# Patient Record
Sex: Male | Born: 1965 | Race: White | Hispanic: No | Marital: Single | Smoking: Former smoker
Health system: Southern US, Community
[De-identification: ages and names within clinical notes are randomized; demographics above are authoritative.]

## PROBLEM LIST (undated history)

## (undated) ENCOUNTER — Ambulatory Visit: Admission: EM | Disposition: A | Discharge status: Home / Self Care | Payer: BC Managed Care – PPO

## (undated) DIAGNOSIS — I10 Essential (primary) hypertension: Secondary | ICD-10-CM

## (undated) DIAGNOSIS — M1990 Unspecified osteoarthritis, unspecified site: Secondary | ICD-10-CM

## (undated) DIAGNOSIS — K219 Gastro-esophageal reflux disease without esophagitis: Secondary | ICD-10-CM

## (undated) DIAGNOSIS — K589 Irritable bowel syndrome without diarrhea: Secondary | ICD-10-CM

## (undated) DIAGNOSIS — Z98811 Dental restoration status: Secondary | ICD-10-CM

## (undated) DIAGNOSIS — R112 Nausea with vomiting, unspecified: Secondary | ICD-10-CM

## (undated) DIAGNOSIS — Z9889 Other specified postprocedural states: Secondary | ICD-10-CM

## (undated) DIAGNOSIS — K635 Polyp of colon: Secondary | ICD-10-CM

## (undated) DIAGNOSIS — K200 Eosinophilic esophagitis: Secondary | ICD-10-CM

## (undated) DIAGNOSIS — M25512 Pain in left shoulder: Secondary | ICD-10-CM

## (undated) DIAGNOSIS — R42 Dizziness and giddiness: Secondary | ICD-10-CM

## (undated) DIAGNOSIS — Z136 Encounter for screening for cardiovascular disorders: Secondary | ICD-10-CM

## (undated) DIAGNOSIS — F411 Generalized anxiety disorder: Secondary | ICD-10-CM

## (undated) HISTORY — DX: Polyp of colon: K63.5

## (undated) HISTORY — PX: APPENDECTOMY: SHX54

## (undated) HISTORY — DX: Eosinophilic esophagitis: K20.0

## (undated) HISTORY — DX: Irritable bowel syndrome, unspecified: K58.9

## (undated) HISTORY — DX: Encounter for screening for cardiovascular disorders: Z13.6

## (undated) HISTORY — DX: Generalized anxiety disorder: F41.1

## (undated) HISTORY — DX: Gastro-esophageal reflux disease without esophagitis: K21.9

## (undated) HISTORY — PX: JOINT REPLACEMENT: SHX530

---

## 1996-08-16 HISTORY — PX: NASAL SINUS SURGERY: SHX719

## 1998-08-16 HISTORY — PX: KNEE ARTHROSCOPY: SUR90

## 2000-01-14 ENCOUNTER — Emergency Department (HOSPITAL_COMMUNITY): Admission: EM | Admit: 2000-01-14 | Discharge: 2000-01-14 | Payer: Self-pay | Admitting: Emergency Medicine

## 2000-01-14 ENCOUNTER — Encounter: Payer: Self-pay | Admitting: Emergency Medicine

## 2001-08-16 DIAGNOSIS — Z136 Encounter for screening for cardiovascular disorders: Secondary | ICD-10-CM

## 2001-08-16 HISTORY — DX: Encounter for screening for cardiovascular disorders: Z13.6

## 2002-01-09 ENCOUNTER — Ambulatory Visit (HOSPITAL_COMMUNITY): Admission: RE | Admit: 2002-01-09 | Discharge: 2002-01-09 | Payer: Self-pay | Admitting: Gastroenterology

## 2002-01-18 ENCOUNTER — Encounter: Admission: RE | Admit: 2002-01-18 | Discharge: 2002-01-18 | Payer: Self-pay | Admitting: Gastroenterology

## 2002-01-18 ENCOUNTER — Encounter: Payer: Self-pay | Admitting: Gastroenterology

## 2007-04-17 HISTORY — PX: OTHER SURGICAL HISTORY: SHX169

## 2007-04-24 ENCOUNTER — Ambulatory Visit (HOSPITAL_BASED_OUTPATIENT_CLINIC_OR_DEPARTMENT_OTHER): Admission: RE | Admit: 2007-04-24 | Discharge: 2007-04-24 | Payer: Self-pay | Admitting: Orthopedic Surgery

## 2007-09-25 ENCOUNTER — Ambulatory Visit: Payer: Self-pay | Admitting: Family Medicine

## 2008-08-16 DIAGNOSIS — K200 Eosinophilic esophagitis: Secondary | ICD-10-CM

## 2008-08-16 HISTORY — DX: Eosinophilic esophagitis: K20.0

## 2009-11-14 ENCOUNTER — Ambulatory Visit: Payer: Self-pay | Admitting: Otolaryngology

## 2010-12-29 NOTE — Op Note (Signed)
NAME:  Ronald Cordova, Ronald Cordova NO.:  [id number]   MEDICAL RECORD NO.:  [id number]          PATIENT TYPE:  AMB   LOCATION:  [facility]                         FACILITY:  [facility]   PHYSICIAN:  Marlowe Kays, M.D.  DATE OF BIRTH:  November 05, 1965   DATE OF PROCEDURE:  04/24/2007  DATE OF DISCHARGE:                               OPERATIVE REPORT   PREOPERATIVE DIAGNOSIS:  Right carpal tunnel syndrome.   POSTOPERATIVE DIAGNOSIS:  Right carpal tunnel syndrome.   OPERATION:  Decompression of median nerve, right wrist and hand.   SURGEON:  Marlowe Kays, M.D.   ASSISTANTDruscilla Brownie. Underwood, P.A.-C.   ANESTHESIA:  General.   PATHOLOGY AND JUSTIFICATION FOR PROCEDURE:  Signs and symptoms for  carpal tunnel syndrome with abnormal median nerve conduction studies  bilaterally, slightly on the left and severe on the right.  See  operative description below for additional aspects of pathology.   PROCEDURE:  By his choice, we did a general anesthetic. A time-out was  performed.  Right arm was esmarched out nonsterilely and prepped with  DuraPrep from mid forearm to fingertips and draped in a sterile field.  I marked out a curved incision along the base of thenar eminence,  crossing obliquely over the flexor crease of the wrist in the distal  forearm.  The palmaris longus tendon was identified and retracted to the  radial side.  Median nerve was identified and I then began progressive  release into the proximal carpal canal.  At this point, the dominant  finding appeared to be a muscle belly that was pressing on the median  nerve.  I had to snip through the substance rather than being able to go  to the ulnar side because of its anatomic dimensions.  I then also  released the carpal canal.  There was some slight compression of the  median nerve in this location and I continued the dissection of the  skin, subcutaneous tissue and fascia into the distal palm.  The wound  was then  irrigated with sterile saline.  Skin and subcutaneous tissue  were approximated with interrupted 4-0 nylon mattress sutures.  Betadine, Adaptic, dry sterile dressing and a volar plaster splint were  applied.  The tourniquet was released.  I was preparing this dictation  when the operating room personnel came out and said he had a little  breakthrough bleeding around the side of the splint at the base of the  thumb.  On inspection, this appeared to be fairly minor and I treated  this by adding some sterile sponges and another Ace wrap, which gave a  nice compressive dressing and no substantial bleeding was noted.  Other  than this, there were no complications.  He was taken to the recovery  room in satisfactory condition.           ______________________________  Marlowe Kays, M.D.     JA/MEDQ  D:  04/24/2007  T:  04/25/2007  Job:  [id number]

## 2011-01-08 DIAGNOSIS — K635 Polyp of colon: Secondary | ICD-10-CM

## 2011-01-08 HISTORY — DX: Polyp of colon: K63.5

## 2011-05-06 ENCOUNTER — Encounter: Payer: Self-pay | Admitting: Internal Medicine

## 2011-05-07 ENCOUNTER — Ambulatory Visit (INDEPENDENT_AMBULATORY_CARE_PROVIDER_SITE_OTHER): Payer: BC Managed Care – PPO | Admitting: Internal Medicine

## 2011-05-07 ENCOUNTER — Encounter: Payer: Self-pay | Admitting: Internal Medicine

## 2011-05-07 DIAGNOSIS — Z206 Contact with and (suspected) exposure to human immunodeficiency virus [HIV]: Secondary | ICD-10-CM

## 2011-05-07 DIAGNOSIS — Z79899 Other long term (current) drug therapy: Secondary | ICD-10-CM

## 2011-05-07 DIAGNOSIS — F411 Generalized anxiety disorder: Secondary | ICD-10-CM

## 2011-05-07 DIAGNOSIS — Z20828 Contact with and (suspected) exposure to other viral communicable diseases: Secondary | ICD-10-CM

## 2011-05-07 DIAGNOSIS — Z720 Tobacco use: Secondary | ICD-10-CM

## 2011-05-07 DIAGNOSIS — F419 Anxiety disorder, unspecified: Secondary | ICD-10-CM

## 2011-05-07 DIAGNOSIS — E785 Hyperlipidemia, unspecified: Secondary | ICD-10-CM

## 2011-05-07 DIAGNOSIS — Z7721 Contact with and (suspected) exposure to potentially hazardous body fluids: Secondary | ICD-10-CM

## 2011-05-07 DIAGNOSIS — F17200 Nicotine dependence, unspecified, uncomplicated: Secondary | ICD-10-CM

## 2011-05-07 MED ORDER — CLONAZEPAM 0.5 MG PO TABS: 0.5000 mg | ORAL_TABLET | Freq: Every day | ORAL | Status: DC | PRN

## 2011-05-07 MED ORDER — VARENICLINE TARTRATE 0.5 MG X 11 & 1 MG X 42 PO MISC: ORAL | Status: DC

## 2011-05-07 MED ORDER — VARENICLINE TARTRATE 0.5 MG PO TABS: 0.5000 mg | ORAL_TABLET | Freq: Two times a day (BID) | ORAL | Status: AC

## 2011-05-09 ENCOUNTER — Encounter: Payer: Self-pay | Admitting: Internal Medicine

## 2011-05-09 DIAGNOSIS — E782 Mixed hyperlipidemia: Secondary | ICD-10-CM | POA: Insufficient documentation

## 2011-05-09 DIAGNOSIS — K589 Irritable bowel syndrome without diarrhea: Secondary | ICD-10-CM | POA: Insufficient documentation

## 2011-05-09 DIAGNOSIS — Z136 Encounter for screening for cardiovascular disorders: Secondary | ICD-10-CM | POA: Insufficient documentation

## 2011-05-09 DIAGNOSIS — F411 Generalized anxiety disorder: Secondary | ICD-10-CM | POA: Insufficient documentation

## 2011-05-09 DIAGNOSIS — K200 Eosinophilic esophagitis: Secondary | ICD-10-CM | POA: Insufficient documentation

## 2011-05-09 DIAGNOSIS — K648 Other hemorrhoids: Secondary | ICD-10-CM | POA: Insufficient documentation

## 2011-05-09 NOTE — Assessment & Plan Note (Signed)
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  Repeat due

## 2011-05-09 NOTE — Assessment & Plan Note (Signed)
Managed with clonazepam.  He does not want an SSRI because of the potential weight gain.
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
## 2011-05-20 ENCOUNTER — Encounter: Payer: Self-pay | Admitting: Internal Medicine

## 2011-05-28 LAB — POCT HEMOGLOBIN-HEMACUE: Operator id: 134391

## 2011-11-16 ENCOUNTER — Other Ambulatory Visit: Payer: Self-pay | Admitting: Internal Medicine

## 2011-11-16 DIAGNOSIS — F419 Anxiety disorder, unspecified: Secondary | ICD-10-CM

## 2011-11-16 MED ORDER — CLONAZEPAM 0.5 MG PO TABS: 0.5000 mg | ORAL_TABLET | Freq: Every day | ORAL | Status: DC | PRN

## 2011-11-16 MED ORDER — FLUTICASONE FUROATE 27.5 MCG/SPRAY NA SUSP: 2.0000 | Freq: Every day | NASAL | Status: DC

## 2011-11-23 ENCOUNTER — Telehealth: Payer: Self-pay | Admitting: Internal Medicine

## 2011-11-23 NOTE — Telephone Encounter (Signed)
[phone] Pt wanted to know if we have the whooping cough vaccine.  He would like to get this

## 2011-11-24 ENCOUNTER — Other Ambulatory Visit (INDEPENDENT_AMBULATORY_CARE_PROVIDER_SITE_OTHER): Payer: BC Managed Care – PPO | Admitting: *Deleted

## 2011-11-24 DIAGNOSIS — Z79899 Other long term (current) drug therapy: Secondary | ICD-10-CM

## 2011-11-24 DIAGNOSIS — E785 Hyperlipidemia, unspecified: Secondary | ICD-10-CM

## 2011-11-24 DIAGNOSIS — Z206 Contact with and (suspected) exposure to human immunodeficiency virus [HIV]: Secondary | ICD-10-CM

## 2011-11-24 NOTE — Progress Notes (Signed)
Addended by: Jobie Quaker on: 11/24/2011 09:23 AM   Modules accepted: Orders

## 2011-11-24 NOTE — Telephone Encounter (Signed)
Yes we do, Erie Noe notified patient

## 2011-11-25 ENCOUNTER — Ambulatory Visit (INDEPENDENT_AMBULATORY_CARE_PROVIDER_SITE_OTHER): Payer: BC Managed Care – PPO | Admitting: *Deleted

## 2011-11-25 DIAGNOSIS — Z23 Encounter for immunization: Secondary | ICD-10-CM

## 2011-11-30 ENCOUNTER — Telehealth: Payer: Self-pay | Admitting: Internal Medicine

## 2011-11-30 NOTE — Telephone Encounter (Signed)
[phone] Pt called checking on labs results he had done wed of last week Please advise pt

## 2011-12-01 ENCOUNTER — Telehealth: Payer: Self-pay | Admitting: Internal Medicine

## 2011-12-01 NOTE — Telephone Encounter (Signed)
There is already another note about this. See other note.

## 2011-12-01 NOTE — Telephone Encounter (Signed)
Patient is wanting the results from labs done on 4.10.13. Patient is wanting a call back today.

## 2011-12-01 NOTE — Telephone Encounter (Signed)
Patient had these labs done here,  but it was sent to [facility] due to patient's insurance. I called and requested labs.

## 2011-12-02 ENCOUNTER — Telehealth: Payer: Self-pay | Admitting: Internal Medicine

## 2011-12-02 NOTE — Telephone Encounter (Signed)
Patient returned call and was notified of his labs ( see other phone note) also he is coming back in tomorrow for HIV and has requested that it be sent to [facility].

## 2011-12-02 NOTE — Telephone Encounter (Signed)
We have received patients labs, they are in Dr. Melina Schools green folder waiting to be reviewed.

## 2011-12-02 NOTE — Telephone Encounter (Signed)
Patient will have to have the HIV redrawn because his insurance requires his labs to be sent to [facility] which is where I sent them and they require a different tube than [facility]. Called and left message asking patient to return my call.

## 2011-12-02 NOTE — Telephone Encounter (Signed)
Patient notified

## 2011-12-02 NOTE — Telephone Encounter (Signed)
Cholesterol , liver/kidney function and testosterone levels are all normal. Continue current meds

## 2011-12-03 ENCOUNTER — Other Ambulatory Visit (INDEPENDENT_AMBULATORY_CARE_PROVIDER_SITE_OTHER): Payer: BC Managed Care – PPO | Admitting: *Deleted

## 2011-12-03 ENCOUNTER — Other Ambulatory Visit: Payer: Self-pay | Admitting: Internal Medicine

## 2011-12-03 DIAGNOSIS — Z20828 Contact with and (suspected) exposure to other viral communicable diseases: Secondary | ICD-10-CM

## 2011-12-03 DIAGNOSIS — Z7721 Contact with and (suspected) exposure to potentially hazardous body fluids: Secondary | ICD-10-CM

## 2011-12-08 ENCOUNTER — Telehealth: Payer: Self-pay | Admitting: Internal Medicine

## 2011-12-08 LAB — HIV-1 RNA QUANT-NO REFLEX-BLD: HIV 1 RNA Quant: <20 copies/mL (ref <20)

## 2011-12-08 NOTE — Telephone Encounter (Signed)
Patient wanting results of his lab work.

## 2011-12-09 ENCOUNTER — Encounter: Payer: Self-pay | Admitting: Internal Medicine

## 2011-12-09 NOTE — Telephone Encounter (Signed)
Patient notified of results.

## 2012-01-07 ENCOUNTER — Other Ambulatory Visit: Payer: Self-pay | Admitting: *Deleted

## 2012-01-07 MED ORDER — ROSUVASTATIN CALCIUM 5 MG PO TABS: 5.0000 mg | ORAL_TABLET | Freq: Every day | ORAL | Status: DC

## 2012-01-07 NOTE — Telephone Encounter (Signed)
Done

## 2012-01-21 ENCOUNTER — Ambulatory Visit (INDEPENDENT_AMBULATORY_CARE_PROVIDER_SITE_OTHER): Payer: BC Managed Care – PPO | Admitting: Internal Medicine

## 2012-01-21 ENCOUNTER — Encounter: Payer: Self-pay | Admitting: Internal Medicine

## 2012-01-21 VITALS — BP 124/78 | HR 79 | Temp 98.1°F | Resp 16 | Wt 193.2 lb

## 2012-01-21 DIAGNOSIS — R079 Chest pain, unspecified: Secondary | ICD-10-CM

## 2012-01-21 DIAGNOSIS — R071 Chest pain on breathing: Secondary | ICD-10-CM

## 2012-01-21 DIAGNOSIS — R490 Dysphonia: Secondary | ICD-10-CM

## 2012-01-21 DIAGNOSIS — F40298 Other specified phobia: Secondary | ICD-10-CM

## 2012-01-21 DIAGNOSIS — R05 Cough: Secondary | ICD-10-CM

## 2012-01-21 DIAGNOSIS — F17201 Nicotine dependence, unspecified, in remission: Secondary | ICD-10-CM

## 2012-01-21 DIAGNOSIS — K200 Eosinophilic esophagitis: Secondary | ICD-10-CM

## 2012-01-21 DIAGNOSIS — E785 Hyperlipidemia, unspecified: Secondary | ICD-10-CM

## 2012-01-21 DIAGNOSIS — Z87891 Personal history of nicotine dependence: Secondary | ICD-10-CM

## 2012-01-21 DIAGNOSIS — R059 Cough, unspecified: Secondary | ICD-10-CM

## 2012-01-21 HISTORY — DX: Chest pain, unspecified: R07.9

## 2012-01-21 NOTE — Patient Instructions (Signed)
Consider the Low Glycemic Index Diet and 6 smaller meals daily .  This boosts your metabolism and regulates your sugars:   7 AM Low carbohydrate Protein  Shakes (EAS Carb Control  Or Atkins ,  Available everywhere,   In  cases at [employer] )  2.5 carbs  (Add or substitute a toasted sandwhich thin w/ peanut butter)  10 AM: Protein bar by Atkins (snack size,  Chocolate lover's variety at  [employer])    Lunch: sandwich on pita bread or flatbread (Joseph's makes a pita bread and a flat bread , available at [employer] and [employer]; Toufayah makes a low carb flatbread available at [employer] and [employer]) Mission makes a low carb whole wheat tortilla available at [employer],and most grocery stores   3 PM:  Mid day :  Another protein bar,  Or a  cheese stick, 1/4 cup of almonds, walnuts, pistachios, pecans, peanuts,  Macadamia nuts  6 PM  Dinner:  "mean and green:"  Meat/chicken/fish, salad, and green veggie : use ranch, vinagrette,  Blue cheese, etc  9 PM snack : Breyer's low carb fudgsicle or  ice cream bar (Carb Smart), or  Weight Watcher's ice cream bar , or another protein shake  

## 2012-01-23 ENCOUNTER — Encounter: Payer: Self-pay | Admitting: Internal Medicine

## 2012-01-23 DIAGNOSIS — F17201 Nicotine dependence, unspecified, in remission: Secondary | ICD-10-CM | POA: Insufficient documentation

## 2012-01-23 NOTE — Assessment & Plan Note (Signed)
Managed with crestor low dose.  Repeat lipids due

## 2012-01-23 NOTE — Assessment & Plan Note (Signed)
Managed with flonase

## 2012-01-23 NOTE — Assessment & Plan Note (Signed)
He has had a persistent cough with occasional pleuritic chest pain since quitting tobacco 6 months ago.  Chest CT ordered.

## 2012-01-23 NOTE — Assessment & Plan Note (Signed)
Smoke free since November.  Weight gain addressed

## 2012-01-23 NOTE — Progress Notes (Signed)
Patient ID: Ronald Cordova, male   DOB: [birth year], 46 y.o.   MRN: [id number]  Patient Active Problem List  Diagnoses  . Treadmill stress test negative for angina pectoris  . Anxiety state, unspecified  . Hyperlipidemia LDL goal < 130  . Eosinophilic esophagitis  . Irritable bowel syndrome  . Hemorrhoids  . Isolated or specific phobia  . Chest pain on respiration  . Tobacco abuse, in remission    Subjective:  CC:   Chief Complaint  Patient presents with  . Follow-up    HPI:   Ronald Cordova a 46 y.o. male who presents Follow up on chronic issues including hyperlipidemia , anxiety, tobacco abuse and overweight.  Was screened for HIV after last visit due to recent exposure and was negative. He has no new issues today.  Has been exercising but not  dieting because he gave up smoking after last visit.  Has had persistent cough without fevers or systemic symptoms which has not resolved without antibiotics.    Past Medical History  Diagnosis Date  . Treadmill stress test negative for angina pectoris 2003  . Anxiety state, unspecified   . GERD (gastroesophageal reflux disease)     carafate added by Dr. Loreta Ave  . Polyp of colon, hyperplastic 01/08/2011    [name], repeat due 2018  . Eosinophilic esophagitis 2010    by EGD with  biopsies  . Irritable bowel syndrome   . Hemorrhoids     Past Surgical History  Procedure Date  . Nasal sinus surgery 1998    bilateral  . Knee arthroscopy 2000    left  . Joint replacement   . Carpal tunnel release Sept 2008    R hand, by  [place], [place]   . Appendectomy     done during high school         The following portions of the patient's history were reviewed and updated as appropriate: Allergies, current medications, and problem list.    Review of Systems:   12 Pt  review of systems was negative except those addressed in the HPI,     History   Social History  . Marital Status: Single    Spouse Name: N/A    Number  of Children: N/A  . Years of Education: N/A   Occupational History  . Not on file.   Social History Main Topics  . Smoking status: Former Smoker -- 1.0 packs/day    Types: Cigarettes    Quit date: 05/17/2011  . Smokeless tobacco: Never Used  . Alcohol Use: Yes     occasional  . Drug Use: No  . Sexually Active: Not on file   Other Topics Concern  . Not on file   Social History Narrative  . No narrative on file    Objective:  BP 124/78  Pulse 79  Temp(Src) 98.1 F (36.7 C) (Oral)  Resp 16  Wt 193 lb 4 oz (87.658 kg)  SpO2 96%  General appearance: alert, cooperative and appears stated age Ears: normal TM's and external ear canals both ears Throat: lips, mucosa, and tongue normal; teeth and gums normal Neck: no adenopathy, no carotid bruit, supple, symmetrical, trachea midline and thyroid not enlarged, symmetric, no tenderness/mass/nodules Back: symmetric, no curvature. ROM normal. No CVA tenderness. Lungs: clear to auscultation bilaterally Heart: regular rate and rhythm, S1, S2 normal, no murmur, click, rub or gallop Abdomen: soft, non-tender; bowel sounds normal; no masses,  no organomegaly Pulses: 2+ and symmetric Skin: Skin color,  texture, turgor normal. No rashes or lesions Lymph nodes: Cervical, supraclavicular, and axillary nodes normal.  Assessment and Plan:  Tobacco abuse, in remission Smoke free since November.  Weight gain addressed  Hyperlipidemia LDL goal < 130 Managed with crestor low dose.  Repeat lipids due   Eosinophilic esophagitis Managed with flonase  Chest pain on respiration He has had a persistent cough with occasional pleuritic chest pain since quitting tobacco 6 months ago.  Chest CT ordered.     Updated Medication List Outpatient Encounter Prescriptions as of 01/21/2012  Medication Sig Dispense Refill  . clonazePAM (KLONOPIN) 0.5 MG tablet Take 1 tablet (0.5 mg total) by mouth daily as needed.  30 tablet  5  . fluticasone (VERAMYST)  27.5 MCG/SPRAY nasal spray Place 2 sprays into the nose daily.  16 g  5  . lansoprazole (PREVACID) 30 MG capsule Take 30 mg by mouth daily.        . niacin 500 MG tablet Take 500 mg by mouth daily with breakfast.        . Probiotic Product (ALIGN PO) Take by mouth.        . rosuvastatin (CRESTOR) 5 MG tablet Take 1 tablet (5 mg total) by mouth daily.  90 tablet  1  . DISCONTD: varenicline (CHANTIX PAK) 0.5 MG X 11 & 1 MG X 42 tablet Take one 0.5mg  tablet by mouth once daily for 3 days, then increase to one 0.5mg  tablet twice daily for 3 days, then increase to one 1mg  tablet twice daily.  53 tablet  0     Orders Placed This Encounter  Procedures  . CT Chest W Contrast    No Follow-up on file.

## 2012-01-26 ENCOUNTER — Telehealth: Payer: Self-pay | Admitting: Internal Medicine

## 2012-01-26 NOTE — Telephone Encounter (Addendum)
Patient wants to know if there is a test that can be done to see if he is allergic to I.V dye. He is hesitant about having this procedure done it has been postponed until he hears from the doctor.

## 2012-01-27 NOTE — Telephone Encounter (Signed)
No, I know of no test to do ahead of time.  If he eats shellfish, he should be ok.  We can do the test without contrast if he wants to avoid the possibility

## 2012-01-28 NOTE — Telephone Encounter (Signed)
Left detailed message notifying patient, I asked him to call back to let me know what he wants to do.

## 2012-02-01 NOTE — Telephone Encounter (Signed)
Patient returned phone call stating that he does want to proceed with procedure using the I.V. Dye contrast.

## 2012-02-09 NOTE — Telephone Encounter (Signed)
Patient has be rescheduled for 7.5.13 @ 3:00.

## 2012-02-18 ENCOUNTER — Ambulatory Visit (HOSPITAL_COMMUNITY)
Admission: RE | Admit: 2012-02-18 | Discharge: 2012-02-18 | Disposition: A | Discharge status: Home / Self Care | Payer: BC Managed Care – PPO | Source: Ambulatory Visit | Attending: Internal Medicine | Admitting: Internal Medicine

## 2012-02-18 DIAGNOSIS — R071 Chest pain on breathing: Secondary | ICD-10-CM | POA: Insufficient documentation

## 2012-02-18 DIAGNOSIS — R490 Dysphonia: Secondary | ICD-10-CM | POA: Insufficient documentation

## 2012-02-18 DIAGNOSIS — R911 Solitary pulmonary nodule: Secondary | ICD-10-CM | POA: Insufficient documentation

## 2012-02-18 DIAGNOSIS — R05 Cough: Secondary | ICD-10-CM | POA: Insufficient documentation

## 2012-02-18 DIAGNOSIS — R059 Cough, unspecified: Secondary | ICD-10-CM | POA: Insufficient documentation

## 2012-02-18 DIAGNOSIS — Z87891 Personal history of nicotine dependence: Secondary | ICD-10-CM | POA: Insufficient documentation

## 2012-02-18 MED ORDER — IOHEXOL 300 MG/ML  SOLN
80.0000 mL | Freq: Once | INTRAMUSCULAR | Status: AC | PRN
  Administered 2012-02-18: 80 mL via INTRAVENOUS

## 2012-02-21 ENCOUNTER — Other Ambulatory Visit: Payer: Self-pay | Admitting: Internal Medicine

## 2012-02-21 DIAGNOSIS — R911 Solitary pulmonary nodule: Secondary | ICD-10-CM | POA: Insufficient documentation

## 2012-02-21 NOTE — Assessment & Plan Note (Signed)
Pulmonary nodule found on chest CT.  Pulmonology referral in process.

## 2012-02-22 ENCOUNTER — Telehealth: Payer: Self-pay | Admitting: Internal Medicine

## 2012-02-22 NOTE — Telephone Encounter (Signed)
Patient needing a call back about his CT scan. He wants it read to him over the phone if you don't get him [place]

## 2012-02-22 NOTE — Telephone Encounter (Signed)
I tried calling patient but had to leave a voicemail for him to return my call.

## 2012-02-23 ENCOUNTER — Encounter: Payer: Self-pay | Admitting: Pulmonary Disease

## 2012-02-23 ENCOUNTER — Ambulatory Visit (INDEPENDENT_AMBULATORY_CARE_PROVIDER_SITE_OTHER): Payer: BC Managed Care – PPO | Admitting: Pulmonary Disease

## 2012-02-23 VITALS — BP 130/86 | HR 74 | Temp 98.0°F | Ht 70.0 in | Wt 196.0 lb

## 2012-02-23 DIAGNOSIS — R05 Cough: Secondary | ICD-10-CM | POA: Insufficient documentation

## 2012-02-23 DIAGNOSIS — R911 Solitary pulmonary nodule: Secondary | ICD-10-CM

## 2012-02-23 NOTE — Assessment & Plan Note (Signed)
I explained to Ronald Cordova today that the fact that his pulmonary nodule is round, solid, this has smooth borders, it is only 4.6 mm is reassuring. However considering the fact that this has smoked at least a pack a day for more than 15 years he is in a high-risk category. We are obligated to to follow this although I suspect that the lesion is benign.  Plan: -Repeat CT scan without contrast in 6 months.

## 2012-02-23 NOTE — Progress Notes (Signed)
Subjective:    Patient ID: Ronald Cordova, male    DOB: May 23, 1966, 46 y.o.   MRN: [id number]  HPI Ronald Cordova is a very pleasant 46 year old male has a past medical history significant for anxiety, chronic sinus symptoms with prior sinus surgery and acid reflux disease who comes to our clinic today for evaluation of a pulmonary nodule. He smoked one pack per day for 15 years and quit 10 months ago. He had been having a lot of sinus symptoms and cough and intermittent chest pains and he requested a CT scan of his chest. CT scan showed normal lung parenchyma by 4.6 mm pleural-based nodule is referred to [place] for further evaluation. 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 However he does note a cough in the mornings productive of clear to yellow phlegm which he says he believes comes from his sinuses. He takes Veramyst was helps keeps him open but he still notes he has sinus congestion a daily basis. Claritin sometimes helps but he does not like swallowing pills. He has not used saline rinses. He has acid reflux but its well controlled on his Prevacid.   Past Medical History  Diagnosis Date  . Treadmill stress test negative for angina pectoris 2003  . Anxiety state, unspecified   . GERD (gastroesophageal reflux disease)     carafate added by Dr. Loreta Ave  . Polyp of colon, hyperplastic 01/08/2011    [name], repeat due 2018  . Eosinophilic esophagitis 2010    by EGD with  biopsies  . Irritable bowel syndrome   . Hemorrhoids      Family History  Problem Relation Age of Onset  . Hyperlipidemia Sister   . Hyperlipidemia Brother   . BRCA 1/2 Maternal Aunt      History   Social History  . Marital Status: Single    Spouse Name: N/A    Number of Children: N/A  . Years of Education: N/A   Occupational History  . Not on file.   Social History Main Topics  . Smoking status: Former Smoker -- 1.0 packs/day for 15 years    Types:  Cigarettes    Quit date: 05/17/2011  . Smokeless tobacco: Never Used  . Alcohol Use: Yes     occasional  . Drug Use: No  . Sexually Active: Not on file   Other Topics Concern  . Not on file   Social History Narrative  . No narrative on file     Allergies  Allergen Reactions  . Penicillins      Outpatient Prescriptions Prior to Visit  Medication Sig Dispense Refill  . clonazePAM (KLONOPIN) 0.5 MG tablet Take 1 tablet (0.5 mg total) by mouth daily as needed.  30 tablet  5  . fluticasone (VERAMYST) 27.5 MCG/SPRAY nasal spray Place 2 sprays into the nose daily.  16 g  5  . lansoprazole (PREVACID) 30 MG capsule Take 30 mg by mouth daily.        . Probiotic Product (ALIGN PO) Take 1 tablet by mouth daily.       . rosuvastatin (CRESTOR) 5 MG tablet Take 1 tablet (5 mg total) by mouth daily.  90 tablet  1  . niacin 500 MG tablet Take 500 mg by mouth daily with breakfast.            Review of Systems  Constitutional: Negative for fever, chills, activity change and appetite change.  HENT: Positive for postnasal drip. Negative for hearing loss, ear pain, congestion, rhinorrhea, sneezing, neck pain, neck stiffness and sinus pressure.   Eyes: Negative for redness, itching and visual disturbance.  Respiratory: Positive for shortness of breath. Negative for cough, chest tightness and wheezing.   Cardiovascular: Negative for chest pain, palpitations and leg swelling.  Gastrointestinal: Positive for abdominal pain. Negative for nausea, vomiting, diarrhea, constipation, blood in stool and abdominal distention.  Musculoskeletal: Negative for myalgias, joint swelling, arthralgias and gait problem.  Skin: Negative for rash.  Neurological: Negative for dizziness, light-headedness, numbness and headaches.  Hematological: Does not bruise/bleed easily.  Psychiatric/Behavioral: Negative for confusion and dysphoric mood.       Objective:   Physical Exam Filed Vitals:   02/23/12 1546  BP:  130/86  Pulse: 74  Temp: 98 F (36.7 C)  TempSrc: Oral  Height: 5\' 10"  (1.778 m)  Weight: 196 lb (88.905 kg)  SpO2: 98%   Gen: well appearing, no acute distress HEENT: NCAT, PERRL, EOMi, OP clear, neck supple without masses PULM: CTA B CV: RRR, no mgr, no JVD AB: BS+, soft, nontender, no hsm Ext: warm, no edema, no clubbing, no cyanosis Derm: no rash or skin breakdown Neuro: A&Ox4, CN II-XII intact, strength 5/5 in all 4 extremities  July 2013 CT chest with 4.6 millimeters small, round, regular bordered, solid, pleural-based pulmonary nodule.  02/23/2012 simple spirometry normal     Assessment & Plan:   Incidental pulmonary nodule, > 3mm and < 8mm I explained to Ronald Cordova today that the fact that his pulmonary nodule is round, solid, this has smooth borders, it is only 4.6 mm is reassuring. However considering the fact that this has smoked at least a pack a day for more than 15 years he is in a high-risk category. We are obligated to to follow this although I suspect that the lesion is benign.  Plan: -Repeat CT scan without contrast in 6 months.  Cough Ronald Cordova cough sounds to be related to his sinus symptoms. He states it's really most prevalent in the morning after he has had a lot of sinus drainage night. Fortunately his spirometry which was performed today showed no evidence of asthma or obstructive lung disease.  Plan: -Continue her meds -At [facility] med rinses -And Chlor-Trimeton and over-the-counter decongestant   Updated Medication List Outpatient Encounter Prescriptions as of 02/23/2012  Medication Sig Dispense Refill  . aspirin 81 MG tablet Take 81 mg by mouth daily.      . clonazePAM (KLONOPIN) 0.5 MG tablet Take 1 tablet (0.5 mg total) by mouth daily as needed.  30 tablet  5  . fluticasone (VERAMYST) 27.5 MCG/SPRAY nasal spray Place 2 sprays into the nose daily.  16 g  5  . lansoprazole (PREVACID) 30 MG capsule Take 30 mg by mouth daily.        . Probiotic  Product (ALIGN PO) Take 1 tablet by mouth daily.       . rosuvastatin (CRESTOR) 5 MG tablet Take 1 tablet (5 mg total) by mouth daily.  90 tablet  1  . DISCONTD: niacin 500 MG tablet Take 500 mg by mouth daily with breakfast.

## 2012-02-23 NOTE — Assessment & Plan Note (Addendum)
Ronald Cordova cough sounds to be related to his sinus symptoms. He states it's really most prevalent in the morning after he has had a lot of sinus drainage night. Fortunately his spirometry which was performed today showed no evidence of asthma or obstructive lung disease.  Plan: -Continue her meds -At [facility] med rinses -And Chlor-Trimeton and over-the-counter decongestant

## 2012-02-23 NOTE — Patient Instructions (Addendum)
Use [facility] Med rinses with distilled water at least twice per day using the instructions on the package. 1/2 hour after using the [facility] Med rinse, use Veramyst two puffs in each nostril once per day. Use chlortrimeton and an over the counter decongestant (ask the pharmacist for a recommendation) as needed for the cough.  We will repeat your CT chest in 6 months to look at the nodule and will see you back afterwards.

## 2012-05-17 ENCOUNTER — Other Ambulatory Visit: Payer: Self-pay

## 2012-05-17 MED ORDER — LANSOPRAZOLE 30 MG PO CPDR: 30.0000 mg | DELAYED_RELEASE_CAPSULE | Freq: Every day | ORAL | Status: DC

## 2012-05-17 NOTE — Telephone Encounter (Signed)
Refill request for Prevacid 30 mg #30 0 R. Rx sent in electronic to [facility].

## 2012-05-18 ENCOUNTER — Telehealth: Payer: Self-pay | Admitting: Internal Medicine

## 2012-05-18 ENCOUNTER — Other Ambulatory Visit: Payer: Self-pay

## 2012-05-18 MED ORDER — LANSOPRAZOLE 30 MG PO TBDP: 30.0000 mg | ORAL_TABLET | Freq: Every day | ORAL | Status: DC

## 2012-05-18 NOTE — Telephone Encounter (Signed)
Lansoprazole 30 mg disintegrating tablet #30 0R. Sent in electronic to [facility].

## 2012-05-18 NOTE — Telephone Encounter (Signed)
Patient notified that Rx prevacid, sal u tab sent in to [facility].

## 2012-05-18 NOTE — Telephone Encounter (Signed)
Caller: [name]/Patient; Patient Name: Ronald Cordova; PCP: ; Janith Lima Phone Number: [phone] Pharmacist calling because patient insurance will only pay for 90 day supply of Prevacid Solutab. Per EPIC  Prevacid solutab #90 has already been sent in today. Pharmacy notified.

## 2012-05-18 NOTE — Telephone Encounter (Signed)
Pt called he went to pick up his rx last night His rx for previcaid was tablets He takes the sal u tab (the one that melts) and he takes brand name not generic Pt is completely out of his meds Please advise pt when this is called in [facility] [address]

## 2012-07-20 ENCOUNTER — Other Ambulatory Visit: Payer: Self-pay

## 2012-07-20 MED ORDER — ROSUVASTATIN CALCIUM 5 MG PO TABS: 5.0000 mg | ORAL_TABLET | Freq: Every day | ORAL | Status: DC

## 2012-07-20 NOTE — Telephone Encounter (Signed)
Refill request for Crestor 5 mg # 90 1 R [facility]

## 2012-07-25 ENCOUNTER — Other Ambulatory Visit: Payer: Self-pay

## 2012-07-25 DIAGNOSIS — F419 Anxiety disorder, unspecified: Secondary | ICD-10-CM

## 2012-07-25 MED ORDER — CLONAZEPAM 0.5 MG PO TABS: 0.5000 mg | ORAL_TABLET | Freq: Every day | ORAL | Status: DC | PRN

## 2012-07-25 NOTE — Telephone Encounter (Signed)
[facility]  for Clonazepam 0.5 mg. Ok to refill?

## 2012-07-26 NOTE — Telephone Encounter (Signed)
clonazepam 0.25 mg [facility]

## 2012-07-26 NOTE — Telephone Encounter (Signed)
Correction 0.5 mg

## 2012-07-27 ENCOUNTER — Other Ambulatory Visit: Payer: Self-pay

## 2012-08-14 ENCOUNTER — Ambulatory Visit (INDEPENDENT_AMBULATORY_CARE_PROVIDER_SITE_OTHER): Payer: BC Managed Care – PPO | Admitting: Adult Health

## 2012-08-14 ENCOUNTER — Encounter: Payer: Self-pay | Admitting: Adult Health

## 2012-08-14 VITALS — BP 141/88 | HR 86 | Temp 98.4°F | Ht 66.0 in | Wt 201.0 lb

## 2012-08-14 DIAGNOSIS — R05 Cough: Secondary | ICD-10-CM

## 2012-08-14 MED ORDER — AZITHROMYCIN 200 MG/5ML PO SUSR: ORAL | Status: DC

## 2012-08-14 NOTE — Patient Instructions (Addendum)
  Start your antibiotic today.  You can take Delsym for cough. This is an over-the-counter medication.  Drink plenty of fluids to stay hydrated.  If you do not feel any better within 3-4 days please call us back.

## 2012-08-14 NOTE — Assessment & Plan Note (Signed)
Suspect sinusitis. Purulent drainage suspicious may be bacterial in origin. No fever today but patient has recently been running a fever. Start Azithromycin. Ordered liquid 2/2 patient does not do well with pills. Delsym for cough. Increase fluid intake. Can continue Mucinex OTC.

## 2012-08-14 NOTE — Progress Notes (Signed)
  Subjective:    Patient ID: Ronald Cordova, male    DOB: 1965-09-07, 46 y.o.   MRN: [id number]  HPI  Patient is a very pleasant 46 y/o male who presents to clinic with c/o productive cough of yellow-green sputum. His cough is worse at night. He reports post nasal drip and sinus pressure. He started taking musinex yesterday. He just got over a  a stomach bug on   Current Outpatient Prescriptions on File Prior to Visit  Medication Sig Dispense Refill  . aspirin 81 MG tablet Take 81 mg by mouth daily.      . clonazePAM (KLONOPIN) 0.5 MG tablet Take 1 tablet (0.5 mg total) by mouth daily as needed.  30 tablet  5  . fluticasone (VERAMYST) 27.5 MCG/SPRAY nasal spray Place 2 sprays into the nose daily.  16 g  5  . lansoprazole (PREVACID SOLUTAB) 30 MG disintegrating tablet Take 1 tablet (30 mg total) by mouth daily.  90 tablet  1  . Probiotic Product (ALIGN PO) Take 1 tablet by mouth daily.       . rosuvastatin (CRESTOR) 5 MG tablet Take 1 tablet (5 mg total) by mouth daily.  90 tablet  1     Review of Systems  Constitutional: Positive for fatigue. Negative for appetite change.  HENT: Positive for congestion, sore throat, voice change, postnasal drip and sinus pressure.        Pressure in maxillary sinuses. Ears stopped up  Eyes: Negative.   Respiratory: Positive for cough, chest tightness and wheezing.   Gastrointestinal: Positive for nausea.  Genitourinary: Negative.   Musculoskeletal: Negative.        General aches and pains  Neurological: Positive for light-headedness.  Psychiatric/Behavioral: Negative.     BP 141/88  Pulse 86  Temp 98.4 F (36.9 C) (Oral)  Ht 5\' 6"  (1.676 m)  Wt 201 lb (91.173 kg)  BMI 32.44 kg/m2  SpO2 97%     Objective:   Physical Exam  Constitutional: He is oriented to person, place, and time. He appears well-developed and well-nourished. No distress.  HENT:       Pharyngeal erythema without exudate. Cobblestone appearance.  Neck: Normal range of  motion.  Cardiovascular: Normal rate, regular rhythm and normal heart sounds.  Exam reveals no gallop.   No murmur heard. Pulmonary/Chest: Effort normal and breath sounds normal. No respiratory distress. He has no wheezes.  Abdominal: Soft.  Musculoskeletal: Normal range of motion.  Lymphadenopathy:    He has no cervical adenopathy.  Neurological: He is alert and oriented to person, place, and time.  Skin: Skin is warm and dry. No rash noted.  Psychiatric: He has a normal mood and affect. His behavior is normal. Judgment and thought content normal.       Assessment & Plan:

## 2012-08-21 ENCOUNTER — Other Ambulatory Visit: Payer: BC Managed Care – PPO

## 2012-08-22 ENCOUNTER — Telehealth: Payer: Self-pay | Admitting: *Deleted

## 2012-08-22 ENCOUNTER — Encounter: Payer: Self-pay | Admitting: Pulmonary Disease

## 2012-08-22 NOTE — Telephone Encounter (Signed)
Spoke with pt and notified of results per Dr. Kendrick Fries. Pt verbalized understanding and denied any questions. Reminder sent to myself to f/u ct

## 2012-08-22 NOTE — Telephone Encounter (Signed)
Message copied by Christen Butter on Tue Aug 22, 2012  8:48 AM ------      Message from: Max Fickle B      Created: Tue Aug 22, 2012  8:39 AM       L,            Please let him know that his CT chest was OK (the nodule did not grow) and that he needs another CT chest in June 2013 to make sure it isn't growing.            Thanks,      B

## 2012-08-25 ENCOUNTER — Ambulatory Visit: Payer: BC Managed Care – PPO | Admitting: Internal Medicine

## 2012-08-28 ENCOUNTER — Ambulatory Visit: Payer: BC Managed Care – PPO | Admitting: Pulmonary Disease

## 2012-08-28 ENCOUNTER — Encounter: Payer: Self-pay | Admitting: Internal Medicine

## 2012-08-28 ENCOUNTER — Ambulatory Visit (INDEPENDENT_AMBULATORY_CARE_PROVIDER_SITE_OTHER): Payer: BC Managed Care – PPO | Admitting: Internal Medicine

## 2012-08-28 VITALS — BP 128/80 | HR 74 | Temp 97.8°F | Resp 16 | Wt 202.5 lb

## 2012-08-28 DIAGNOSIS — Z1331 Encounter for screening for depression: Secondary | ICD-10-CM

## 2012-08-28 DIAGNOSIS — E66811 Obesity, class 1: Secondary | ICD-10-CM | POA: Insufficient documentation

## 2012-08-28 DIAGNOSIS — Z23 Encounter for immunization: Secondary | ICD-10-CM

## 2012-08-28 DIAGNOSIS — E669 Obesity, unspecified: Secondary | ICD-10-CM | POA: Insufficient documentation

## 2012-08-28 DIAGNOSIS — Z79899 Other long term (current) drug therapy: Secondary | ICD-10-CM

## 2012-08-28 DIAGNOSIS — R911 Solitary pulmonary nodule: Secondary | ICD-10-CM

## 2012-08-28 DIAGNOSIS — J40 Bronchitis, not specified as acute or chronic: Secondary | ICD-10-CM

## 2012-08-28 DIAGNOSIS — R635 Abnormal weight gain: Secondary | ICD-10-CM

## 2012-08-28 MED ORDER — AZITHROMYCIN 200 MG/5ML PO SUSR: ORAL | Status: DC

## 2012-08-28 NOTE — Patient Instructions (Addendum)
You need to be exercising a minimum of 4 times per week and following a diet faithfully before I will prescribe an appetite suppressant, because they are only helpful while you are on them    This is  my version of a  "Low GI"  Diet:  All of the foods can be found at grocery stores and in bulk at [employer].  The Atkins protein bars and shakes are available in more varieties at [employer], [employer] and [employer].     7 AM Breakfast:  Low carbohydrate Protein  Shakes (I recommend the EAS AdvantEdge "Carb Control" shakes  Or the low carb shakes by Atkins.   Both are available everywhere:  In  cases at [employer]Js  Or in 4 packs at grocery stores and pharmacies  2.5 carbs  (Alternative is  a toasted Arnold's Sandwhich Thin w/ peanut butter, a "Bagel Thin" with cream cheese and salmon) or  a scrambled egg burrito made with a low carb tortilla .  Avoid cereal and bananas, oatmeal too unless you are cooking the old fashioned kind that takes 30-40 minutes to prepare.  the rest is overly processed, has minimal fiber, and is loaded with carbohydrates!   10 AM: Protein bar by Atkins (the snack size, under 200 cal).  There are many varieties , available widely again or in bulk in limited varieties at BJs)  Other so called "protein bars" tend to be loaded with carbohydrates.  Remember, in food advertising, the word "energy" is synonymous for " carbohydrate."  Lunch: sandwich of [place], (or any lunchmeat, grilled meat or canned tuna), fresh avocado, mayonnaise  and cheese on a lower carbohydrate pita bread, flatbread, or tortilla . Ok to use regular mayonnaise. The bread is the only source or carbohydrate that can be decreased (Joseph's makes a pita bread and a flat bread that are 50 cal and 4 net carbs ; Toufayan makes a low carb flatbread that's 100 cal and 9 net carbs  and  Mission makes a low carb whole wheat tortilla  That is 210 cal and 6 net carbs)  3 PM:  Mid day :  Another protein bar,  Or a  cheese stick (100 cal, 0  carbs),  Or 1 ounce of  almonds, walnuts, pistachios, pecans, peanuts,  Macadamia nuts. Or a Dannon light n Fit greek yogurt, 80 cal 8 net carbs . Avoid "granola"; the dried cranberries and raisins are loaded with carbohydrates. Mixed nuts ok if no raisins or cranberries or dried fruit.      6 PM  Dinner:  "mean and green:"  Meat/chicken/fish or a high protein legume; , with a green salad, and a low GI  Veggie (broccoli, cauliflower, green beans, spinach, brussel sprouts. Lima beans) : Avoid "Low fat dressings, as well as Reyne Dumas and [address]! They are loaded with sugar! Instead use ranch, vinagrette,  Blue cheese, etc.  There is a low carb pasta by Dreamfield's available at [employer] [facility] that is acceptable and tastes great. Try Michel Angel's chicken piccata over low carb pasta. The chicken dish is 0 carbs, and can be found in frozen section at BJs and [employer]'s. Also try [employer] "Carnitas" (pulled pork, no sauce,  0 carbs) and his pot roast.   both are in the refrigerated section at BJs   9 PM snack : Breyer's "low carb" fudgsicle or  ice cream bar (Carb Smart line), or  Weight Watcher's ice cream bar , or another "no sugar added" ice  cream;a serving of fresh berries/cherries with whipped cream (Avoid bananas, pineapple, grapes  and watermelon on a regular basis because they are high in sugar)   Remember that snack Substitutions should be less than 15 to 20 carbs  Per serving. Remember to subtract fiber grams and sugar alcohols to get the "net carbs."  Try Dreamfield';s low carb pasta.  Is it delicious and 5 carbs/serving !!!

## 2012-08-28 NOTE — Assessment & Plan Note (Addendum)
Unchanged.  He has been tobacco free for 12 months .  Will repeat in June.

## 2012-08-28 NOTE — Progress Notes (Signed)
Patient ID: Ronald Cordova, male   DOB: 1965-11-01, 47 y.o.   MRN: [id number]  Patient Active Problem List  Diagnosis  . Treadmill stress test negative for angina pectoris  . Anxiety state, unspecified  . Hyperlipidemia LDL goal < 130  . Eosinophilic esophagitis  . Irritable bowel syndrome  . Hemorrhoids  . Isolated or specific phobia  . Chest pain on respiration  . Tobacco abuse, in remission  . Incidental pulmonary nodule, > 3mm and < 8mm  . Cough  . Obesity (BMI 30.0-34.9)  . Bronchitis    Subjective:  CC:   Chief Complaint  Patient presents with  . Follow-up    HPI:   Ronald Cordova a 47 y.o. male who presents Weight gain.  He has gained weight and is frustrated at his inability to lose it .  Has been taking an OTC product advocated by Dr. Neil Crouch, garcinia, with no adverse effects, but no results.  Not exercising.  No constipation , heat or cold intolerance .    Past Medical History  Diagnosis Date  . Treadmill stress test negative for angina pectoris 2003  . Anxiety state, unspecified   . GERD (gastroesophageal reflux disease)     carafate added by Dr. Loreta Ave  . Polyp of colon, hyperplastic 01/08/2011    Ronald Cordova, repeat due 2018  . Eosinophilic esophagitis 2010    by EGD with  biopsies  . Irritable bowel syndrome   . Hemorrhoids     Past Surgical History  Procedure Date  . Nasal sinus surgery 1998    bilateral  . Knee arthroscopy 2000    left  . Joint replacement   . Carpal tunnel release Sept 2008    R hand, by  [place], [place]   . Appendectomy     done during high school     The following portions of the patient's history were reviewed and updated as appropriate: Allergies, current medications, and problem list.    Review of Systems:   12 Pt  review of systems was negative except those addressed in the HPI,     History   Social History  . Marital Status: Single    Spouse Name: N/A    Number of Children: N/A  . Years of Education: N/A     Occupational History  . Not on file.   Social History Main Topics  . Smoking status: Former Smoker -- 1.0 packs/day for 15 years    Types: Cigarettes    Quit date: 05/17/2011  . Smokeless tobacco: Never Used  . Alcohol Use: Yes     Comment: occasional  . Drug Use: No  . Sexually Active: Not on file   Other Topics Concern  . Not on file   Social History Narrative  . No narrative on file    Objective:  BP 128/80  Pulse 74  Temp 97.8 F (36.6 C) (Oral)  Resp 16  Wt 202 lb 8 oz (91.853 kg)  SpO2 98%  General appearance: alert, cooperative and appears stated age Ears: normal TM's and external ear canals both ears Throat: lips, mucosa, and tongue normal; teeth and gums normal Neck: no adenopathy, no carotid bruit, supple, symmetrical, trachea midline and thyroid not enlarged, symmetric, no tenderness/mass/nodules Back: symmetric, no curvature. ROM normal. No CVA tenderness. Lungs: clear to auscultation bilaterally Heart: regular rate and rhythm, S1, S2 normal, no murmur, click, rub or gallop Abdomen: soft, non-tender; bowel sounds normal; no masses,  no organomegaly Pulses: 2+ and  symmetric Skin: Skin color, texture, turgor normal. No rashes or lesions Lymph nodes: Cervical, supraclavicular, and axillary nodes normal.  Assessment and Plan:  Obesity (BMI 30.0-34.9) I have addressed  BMI and recommended a low glycemic index diet utilizing smaller more frequent meals to increase metabolism.  I have also recommended that patient start exercising with a goal of 30 minutes of aerobic exercise a minimum of 5 days per week. I will only prescribe appetite stimulants if he is following these guidelines and his weight plateaus.   Bronchitis He did not take the azithromycin that was prescribed last visit and symptoms have persisted .  Rewritten rx for liquid azithromycin   Incidental pulmonary nodule, > 3mm and < 8mm Unchanged.  He has been tobacco free for 12 months .  Will  repeat in June.    Updated Medication List Outpatient Encounter Prescriptions as of 08/28/2012  Medication Sig Dispense Refill  . aspirin 81 MG tablet Take 81 mg by mouth daily.      . clonazePAM (KLONOPIN) 0.5 MG tablet Take 1 tablet (0.5 mg total) by mouth daily as needed.  30 tablet  5  . fluticasone (VERAMYST) 27.5 MCG/SPRAY nasal spray Place 2 sprays into the nose daily.  16 g  5  . lansoprazole (PREVACID SOLUTAB) 30 MG disintegrating tablet Take 1 tablet (30 mg total) by mouth daily.  90 tablet  1  . Probiotic Product (ALIGN PO) Take 1 tablet by mouth daily.       . rosuvastatin (CRESTOR) 5 MG tablet Take 1 tablet (5 mg total) by mouth daily.  90 tablet  1  . azithromycin (ZITHROMAX) 200 MG/5ML suspension Take 10 mls on day 1, then take 5 mls daily for the next 4 days.  30 mL  0  . promethazine (PHENERGAN) 25 MG suppository       . [DISCONTINUED] azithromycin (ZITHROMAX) 200 MG/5ML suspension Take 10 mls on day 1, then take 5 mls daily for the next 4 days.  30 mL  0

## 2012-08-28 NOTE — Assessment & Plan Note (Signed)
I have addressed  BMI and recommended a low glycemic index diet utilizing smaller more frequent meals to increase metabolism.  I have also recommended that patient start exercising with a goal of 30 minutes of aerobic exercise a minimum of 5 days per week. I will only prescribe appetite stimulants if he is following these guidelines and his weight plateaus.

## 2012-08-28 NOTE — Assessment & Plan Note (Signed)
He did not take the azithromycin that was prescribed last visit and symptoms have persisted .  Rewritten rx for liquid azithromycin

## 2012-11-07 ENCOUNTER — Other Ambulatory Visit: Payer: Self-pay | Admitting: Internal Medicine

## 2012-11-07 NOTE — Telephone Encounter (Signed)
Med filled.  

## 2013-01-17 ENCOUNTER — Other Ambulatory Visit: Payer: Self-pay | Admitting: *Deleted

## 2013-01-17 MED ORDER — ROSUVASTATIN CALCIUM 5 MG PO TABS: 5.0000 mg | ORAL_TABLET | Freq: Every day | ORAL | Status: DC

## 2013-01-17 NOTE — Telephone Encounter (Signed)
Rx filled for one month, notified pt that appointment needed for future refills.

## 2013-02-06 ENCOUNTER — Other Ambulatory Visit: Payer: Self-pay | Admitting: *Deleted

## 2013-02-06 DIAGNOSIS — F419 Anxiety disorder, unspecified: Secondary | ICD-10-CM

## 2013-02-07 ENCOUNTER — Other Ambulatory Visit: Payer: Self-pay | Admitting: *Deleted

## 2013-02-07 DIAGNOSIS — F419 Anxiety disorder, unspecified: Secondary | ICD-10-CM

## 2013-02-07 NOTE — Telephone Encounter (Signed)
Last OV 1/14. Refill?

## 2013-02-08 ENCOUNTER — Telehealth: Payer: Self-pay | Admitting: Internal Medicine

## 2013-02-08 DIAGNOSIS — F419 Anxiety disorder, unspecified: Secondary | ICD-10-CM

## 2013-02-08 DIAGNOSIS — E785 Hyperlipidemia, unspecified: Secondary | ICD-10-CM

## 2013-02-08 DIAGNOSIS — Z79899 Other long term (current) drug therapy: Secondary | ICD-10-CM

## 2013-02-08 MED ORDER — ROSUVASTATIN CALCIUM 5 MG PO TABS: 5.0000 mg | ORAL_TABLET | Freq: Every day | ORAL | Status: DC

## 2013-02-08 MED ORDER — CLONAZEPAM 0.5 MG PO TABS: 0.5000 mg | ORAL_TABLET | Freq: Every day | ORAL | Status: DC | PRN

## 2013-02-08 MED ORDER — FLUTICASONE FUROATE 27.5 MCG/SPRAY NA SUSP: 2.0000 | Freq: Every day | NASAL | Status: DC

## 2013-02-08 MED ORDER — LANSOPRAZOLE 30 MG PO TBDP: ORAL_TABLET | ORAL | Status: DC

## 2013-02-08 MED ORDER — PROMETHAZINE HCL 25 MG RE SUPP: 25.0000 mg | Freq: Four times a day (QID) | RECTAL | Status: DC | PRN

## 2013-02-08 NOTE — Telephone Encounter (Signed)
HE DOES NOT NEED TO BE SEEN 90 DAYS REFILLED.  ON ALL MEDS.  NEEDS FASTING LIPDIS AND cmet DONE NEXT MONTH FOR MOINTORING OF CRESTRO THERAPY.

## 2013-02-08 NOTE — Telephone Encounter (Signed)
Patient called stating he needed a 90 day refill on all meds in possible. He knows the klonopin is only 30 days but with his drug coverage is better for a 90 supply. Please advise. He is willing to make an appointment if he needs to, but he is very aggravated that he can't get anyone's help in " [place]"

## 2013-02-08 NOTE — Telephone Encounter (Signed)
Lab appointment scheduled for next month.

## 2013-02-19 DIAGNOSIS — R6882 Decreased libido: Secondary | ICD-10-CM | POA: Insufficient documentation

## 2013-02-19 DIAGNOSIS — N138 Other obstructive and reflux uropathy: Secondary | ICD-10-CM | POA: Insufficient documentation

## 2013-02-19 DIAGNOSIS — E291 Testicular hypofunction: Secondary | ICD-10-CM | POA: Insufficient documentation

## 2013-02-20 ENCOUNTER — Other Ambulatory Visit (INDEPENDENT_AMBULATORY_CARE_PROVIDER_SITE_OTHER): Payer: BC Managed Care – PPO

## 2013-02-20 DIAGNOSIS — Z79899 Other long term (current) drug therapy: Secondary | ICD-10-CM

## 2013-02-20 DIAGNOSIS — E785 Hyperlipidemia, unspecified: Secondary | ICD-10-CM

## 2013-02-20 LAB — LIPID PANEL
Cholesterol: 160 mg/dL (ref 0–200)
HDL: 36.7 mg/dL — ABNORMAL LOW (ref >39.00)
LDL Cholesterol: 103 mg/dL — ABNORMAL HIGH (ref 0–99)
Triglycerides: 100 mg/dL (ref 0.0–149.0)

## 2013-02-20 LAB — COMPREHENSIVE METABOLIC PANEL
Albumin: 4.3 g/dL (ref 3.5–5.2)
Alkaline Phosphatase: 52 U/L (ref 39–117)
BUN: 12 mg/dL (ref 6–23)
Creatinine, Ser: 0.9 mg/dL (ref 0.4–1.5)
Glucose, Bld: 102 mg/dL — ABNORMAL HIGH (ref 70–99)
Potassium: 4.4 mEq/L (ref 3.5–5.1)
Total Bilirubin: 0.6 mg/dL (ref 0.3–1.2)

## 2013-05-07 ENCOUNTER — Other Ambulatory Visit: Payer: Self-pay | Admitting: Internal Medicine

## 2013-05-14 ENCOUNTER — Encounter: Payer: Self-pay | Admitting: Adult Health

## 2013-05-14 ENCOUNTER — Ambulatory Visit (INDEPENDENT_AMBULATORY_CARE_PROVIDER_SITE_OTHER): Payer: BC Managed Care – PPO | Admitting: Adult Health

## 2013-05-14 VITALS — BP 118/72 | HR 76 | Temp 98.0°F | Resp 12 | Wt 212.0 lb

## 2013-05-14 DIAGNOSIS — I87309 Chronic venous hypertension (idiopathic) without complications of unspecified lower extremity: Secondary | ICD-10-CM

## 2013-05-14 DIAGNOSIS — Z23 Encounter for immunization: Secondary | ICD-10-CM

## 2013-05-14 DIAGNOSIS — E669 Obesity, unspecified: Secondary | ICD-10-CM

## 2013-05-14 DIAGNOSIS — I87302 Chronic venous hypertension (idiopathic) without complications of left lower extremity: Secondary | ICD-10-CM | POA: Insufficient documentation

## 2013-05-14 MED ORDER — LORCASERIN HCL 10 MG PO TABS: 10.0000 mg | ORAL_TABLET | Freq: Two times a day (BID) | ORAL | Status: DC

## 2013-05-14 NOTE — Progress Notes (Signed)
  Subjective:    Patient ID: Ronald Cordova, male    DOB: 1965/10/30, 47 y.o.   MRN: [id number]  HPI  Patient is a pleasant 47 year old male with history of hyperlipidemia, obesity with BMI greater than 30, remote tobacco history who presents to clinic with complaints of bilateral lower extremity redness and occasional swelling. 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 He reports stopping approximately every 3 hours to walk and stretch his legs. He is exercising daily by brisk walking making sure to elevate his heart rate. Patient reports that since quitting smoking he has gradually been gaining weight. He has gained 10 pounds since the last time he was seen in clinic in January 2014. He is requesting assistance to help with his appetite. He has a friend who is also a truck driver who has lost a considerable amount of weight on belviq and he is inquiring about trying this medication short term.   Current Outpatient Prescriptions on File Prior to Visit  Medication Sig Dispense Refill  . aspirin 81 MG tablet Take 81 mg by mouth daily.      . clonazePAM (KLONOPIN) 0.5 MG tablet Take 1 tablet (0.5 mg total) by mouth daily as needed.  90 tablet  0  . fluticasone (VERAMYST) 27.5 MCG/SPRAY nasal spray Place 2 sprays into the nose daily.  30 g  3  . Probiotic Product (ALIGN PO) Take 1 tablet by mouth daily.       . promethazine (PHENERGAN) 25 MG suppository Place 1 suppository (25 mg total) rectally every 6 (six) hours as needed for nausea.  36 each  0  . rosuvastatin (CRESTOR) 5 MG tablet Take 1 tablet (5 mg total) by mouth daily.  90 tablet  1  . lansoprazole (PREVACID SOLUTAB) 30 MG disintegrating tablet TAKE ONE CAPSULE BY MOUTH DAILY  90 tablet  1   No current facility-administered medications on file prior to visit.     Review of Systems  Constitutional: Negative.   Respiratory: Negative.   Cardiovascular: Positive for leg swelling.   Gastrointestinal: Negative.   Genitourinary: Negative.   Skin:       Skin of bilateral lower extremity slightly red  Psychiatric/Behavioral: Negative for suicidal ideas, hallucinations, behavioral problems, confusion, sleep disturbance and agitation. The patient is not nervous/anxious and is not hyperactive.   All other systems reviewed and are negative.       Objective:   Physical Exam  Constitutional: He is oriented to person, place, and time.  Pleasant 47 year old male in no distress  Cardiovascular: Normal rate and regular rhythm.   Pulmonary/Chest: Effort normal. No respiratory distress.  Musculoskeletal: He exhibits no edema.  No current edema noted bilateral lower extremities.  Neurological: He is alert and oriented to person, place, and time.  Skin: Skin is warm and dry. No erythema.  Psychiatric: He has a normal mood and affect. His behavior is normal. Judgment and thought content normal.    BP 118/72  Pulse 76  Temp(Src) 98 F (36.7 C) (Oral)  Resp 12  Wt 212 lb (96.163 kg)  BMI 34.23 kg/m2  SpO2 97%       Assessment & Plan:

## 2013-05-14 NOTE — Assessment & Plan Note (Signed)
Patient currently has no edema. I suspect that his symptoms are directly related to his line of work. He spends a considerable amount of time sitting. Patient works as a Naval architect for [employer]. Encouraged him to stop frequently and move around. He may benefit from a compression socks to help with circulation. Instructed to do calf pumps while driving. The discoloration that he is describing is possibly related to the edema and venous insufficiency. This is why compression socks may be of benefit to the patient. Weight loss will also help with his symptoms. Continue to follow

## 2013-05-14 NOTE — Assessment & Plan Note (Signed)
Patient has maintained himself smoke-free. He has been gaining weight consistently. He walks daily. Month trial of Belviq 10 mg bid. Reviewed drug information, side effects, how medication should be taken. I have asked him to monitor his blood pressure often to see the effects of this medication on his blood pressure although this medication is a selective serotonin agonist and not a CNS stimulant like others on the market. He will return in one month for weigh-in and followup.

## 2013-06-29 ENCOUNTER — Ambulatory Visit (INDEPENDENT_AMBULATORY_CARE_PROVIDER_SITE_OTHER): Payer: BC Managed Care – PPO | Admitting: Podiatry

## 2013-06-29 ENCOUNTER — Encounter: Payer: Self-pay | Admitting: Podiatry

## 2013-06-29 VITALS — BP 131/81 | HR 86 | Resp 16 | Ht 70.0 in | Wt 210.0 lb

## 2013-06-29 DIAGNOSIS — B351 Tinea unguium: Secondary | ICD-10-CM

## 2013-06-29 NOTE — Progress Notes (Signed)
  Subjective:    Patient ID: Ronald Cordova, male    DOB: 11-12-1965, 47 y.o.   MRN: [id number]  HPI    Review of Systems  Constitutional: Negative.   HENT: Negative.   Eyes: Negative.   Respiratory: Negative.   Cardiovascular: Negative.   Gastrointestinal: Negative.   Endocrine: Negative.   Genitourinary: Negative.   Musculoskeletal: Negative.   Skin: Negative.   Allergic/Immunologic: Negative.   Neurological: Negative.   Hematological: Negative.   Psychiatric/Behavioral: Negative.        Objective:   Physical Exam        Assessment & Plan:

## 2013-06-29 NOTE — Progress Notes (Signed)
Subjective:     Patient ID: Ronald Cordova, male   DOB: 1965-12-20, 47 y.o.   MRN: [id number]  HPI patient's right foot shows small amount of irritation around the lateral side with no drainage edema or erythema noted. States it's been like this for a couple week   Review of Systems     Objective:   Physical Exam  Nursing note and vitals reviewed. Constitutional: He is oriented to person, place, and time.  Cardiovascular: Intact distal pulses.   Neurological: He is oriented to person, place, and time.   patient's right hallux lateral border the skin is slightly growing over the nail fold itself    Assessment:     Mild paronychia infection with possibility of recurrent ingrown    Plan:     Continue soaks and offered antibiotics today. He does not want to take oral antibiotics currently so at this point we will just use a Band-Aid treatment during the day and watch it. If not improved after the holidays he will have more of the corner removed

## 2013-09-17 ENCOUNTER — Encounter: Payer: Self-pay | Admitting: Podiatry

## 2013-09-17 ENCOUNTER — Ambulatory Visit (INDEPENDENT_AMBULATORY_CARE_PROVIDER_SITE_OTHER): Payer: BC Managed Care – PPO | Admitting: Podiatry

## 2013-09-17 VITALS — BP 117/82 | HR 79 | Resp 16

## 2013-09-17 DIAGNOSIS — L608 Other nail disorders: Secondary | ICD-10-CM

## 2013-09-17 NOTE — Progress Notes (Signed)
Right great toenail growing funny , and the left great growing funny  from where I had the matrixectomy is performed.  Objective: Vital signs are stable he is alert and oriented x3. The nails appear to be growing out quite nicely he has 2 small spicules that are retained from the previous nail are causing no damage. I trim these margins off today and he is pain-free. Assessment: Small spicules of nail remaining to the hallux bilateral status post matrixectomy's.  Plan: Debridement of the areas today followup with me as needed

## 2013-10-30 ENCOUNTER — Other Ambulatory Visit: Payer: Self-pay | Admitting: Internal Medicine

## 2013-10-30 NOTE — Telephone Encounter (Signed)
Refill once only Needs to be seen

## 2013-10-30 NOTE — Telephone Encounter (Signed)
Last non acute visit 08/28/12

## 2014-01-03 ENCOUNTER — Other Ambulatory Visit: Payer: Self-pay | Admitting: Internal Medicine

## 2014-01-03 MED ORDER — ROSUVASTATIN CALCIUM 5 MG PO TABS: 5.0000 mg | ORAL_TABLET | Freq: Every day | ORAL | Status: DC

## 2014-01-08 ENCOUNTER — Other Ambulatory Visit: Payer: Self-pay | Admitting: *Deleted

## 2014-01-10 ENCOUNTER — Telehealth: Payer: Self-pay | Admitting: Internal Medicine

## 2014-01-10 MED ORDER — ROSUVASTATIN CALCIUM 5 MG PO TABS: 5.0000 mg | ORAL_TABLET | Freq: Every day | ORAL | Status: DC

## 2014-01-10 NOTE — Telephone Encounter (Signed)
Patient notified 30 day supply sent because he needs an appointment for further refills patient stated insurance will not pay for 30 day supply. Please advise can refill once for 90?

## 2014-01-10 NOTE — Telephone Encounter (Signed)
Sent.  Please ask him to get fasting labs done ASAP and make ov appt

## 2014-01-10 NOTE — Telephone Encounter (Addendum)
Patient stopped by the office to request a rx for 90 days for crestor. Patient stated that he is complete out. Patient stated that a rx had been sent to the pharmacy but his ins required that it be a 90 day supply. Please call pt when ready/msn

## 2014-01-11 NOTE — Telephone Encounter (Signed)
Appt scheduled 01/14/14

## 2014-01-14 ENCOUNTER — Ambulatory Visit (INDEPENDENT_AMBULATORY_CARE_PROVIDER_SITE_OTHER): Payer: BC Managed Care – PPO | Admitting: Internal Medicine

## 2014-01-14 ENCOUNTER — Encounter: Payer: Self-pay | Admitting: Internal Medicine

## 2014-01-14 VITALS — BP 132/82 | HR 64 | Temp 98.4°F | Resp 16 | Ht 70.0 in | Wt 211.5 lb

## 2014-01-14 DIAGNOSIS — B372 Candidiasis of skin and nail: Secondary | ICD-10-CM | POA: Insufficient documentation

## 2014-01-14 DIAGNOSIS — L739 Follicular disorder, unspecified: Secondary | ICD-10-CM | POA: Insufficient documentation

## 2014-01-14 DIAGNOSIS — M5137 Other intervertebral disc degeneration, lumbosacral region: Secondary | ICD-10-CM | POA: Insufficient documentation

## 2014-01-14 DIAGNOSIS — L738 Other specified follicular disorders: Secondary | ICD-10-CM

## 2014-01-14 DIAGNOSIS — L22 Diaper dermatitis: Secondary | ICD-10-CM

## 2014-01-14 DIAGNOSIS — E669 Obesity, unspecified: Secondary | ICD-10-CM

## 2014-01-14 DIAGNOSIS — M51379 Other intervertebral disc degeneration, lumbosacral region without mention of lumbar back pain or lower extremity pain: Secondary | ICD-10-CM | POA: Insufficient documentation

## 2014-01-14 MED ORDER — CYCLOBENZAPRINE HCL 5 MG PO TABS: 5.0000 mg | ORAL_TABLET | Freq: Three times a day (TID) | ORAL | Status: DC | PRN

## 2014-01-14 MED ORDER — DOXYCYCLINE HYCLATE 100 MG PO TABS: 100.0000 mg | ORAL_TABLET | Freq: Two times a day (BID) | ORAL | Status: AC

## 2014-01-14 MED ORDER — NYSTATIN 100000 UNIT/GM EX POWD: Freq: Two times a day (BID) | CUTANEOUS | Status: DC

## 2014-01-14 NOTE — Patient Instructions (Addendum)
Switch to an antibacterial soap for the summer:  Lever 2000.  Doxycycline twice daily after meal for 7 days to treat tick bite and folliculitis  Flexeril 5 mg up to 3 times daily for back spasm  Consider working with a trainer  Or asking Dr Larinda Buttery for exercises to strengthen your back muscles   Belviq   Goal is 10.5 lb in 3 months ,  Or more     Return for office  visit in 3 months to have weight checked ,  Fasting lipids prior  nonfasting labs  Were done recently by Dr Loreta Ave

## 2014-01-14 NOTE — Progress Notes (Signed)
Patient ID: Ronald Cordova, male   DOB: [birth year], 48 y.o.   MRN: [id number]   Patient Active Problem List   Diagnosis Date Noted  . Degeneration of lumbar or lumbosacral intervertebral disc 01/14/2014  . Folliculitis 01/14/2014  . Diaper candidiasis 01/14/2014  . Stasis edema of left lower extremity 05/14/2013  . Obesity (BMI 30.0-34.9) 08/28/2012  . Bronchitis 08/28/2012  . Cough 02/23/2012  . Incidental pulmonary nodule, > 40mm and < 30mm 02/21/2012  . 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 Isolated or specific phobia 01/21/2012  . Chest pain on respiration 01/21/2012  . Hyperlipidemia LDL goal < 130 05/09/2011  . Treadmill stress test negative for angina pectoris   . Anxiety state, unspecified   . Eosinophilic esophagitis   . Irritable bowel syndrome   . Hemorrhoids     Subjective:  CC:   Chief Complaint  Patient presents with  . Follow-up    medication refill,   . Back Pain    lower back spasms    HPI:   Ronald Cordova is a 48 y.o. male who presents for Follow up on chronic conditions including chronic back pain, obesity, and anxiety.  He was last seen by me jan 2014.  He was evaluated by RR  In September for continued weight gain and prescribed Belviq,  but he did not start the medication but now feels motivated to use it.  .  He has gained 28 lbs over the lat 3 years since quitting smoking. He has been walking daily during breaks from driving.   He is having recurrent back spasms brought on by prolonged sitting,  And gardening.   Has a large swelling area on his back from a tick bute.  Follicular rash on his chest .   His anxiety  Remains controlled on current medication .     Past Medical History  Diagnosis Date  . Treadmill stress test negative for angina pectoris 2003  . Anxiety state, unspecified   . GERD (gastroesophageal reflux disease)     carafate added by Dr. Loreta Ave  . Polyp of colon, hyperplastic 01/08/2011    [name], repeat due 2018  .  Eosinophilic esophagitis 2010    by EGD with  biopsies  . Irritable bowel syndrome   . Hemorrhoids     Past Surgical History  Procedure Laterality Date  . Nasal sinus surgery  1998    bilateral  . Knee arthroscopy  2000    left  . Joint replacement    . Carpal tunnel release  Sept 2008    R hand, by  [place], [place]   . Appendectomy      done during high school       The following portions of the patient's history were reviewed and updated as appropriate: Allergies, current medications, and problem list.    Review of Systems:   Patient denies headache, fevers, malaise, unintentional weight loss, skin rash, eye pain, sinus congestion and sinus pain, sore throat, dysphagia,  hemoptysis , cough, dyspnea, wheezing, chest pain, palpitations, orthopnea, edema, abdominal pain, nausea, melena, diarrhea, constipation, flank pain, dysuria, hematuria, urinary  Frequency, nocturia, numbness, tingling, seizures,  Focal weakness, Loss of consciousness,  Tremor, insomnia, depression, anxiety, and suicidal ideation.     History   Social History  . Marital Status: Single    Spouse Name: N/A    Number of Children: N/A  . Years of Education: N/A   Occupational History  . Not on file.  Social History Main Topics  . Smoking status: Former Smoker -- 1.00 packs/day for 15 years    Types: Cigarettes    Quit date: 05/17/2011  . Smokeless tobacco: Never Used  . Alcohol Use: Yes     Comment: occasional  . Drug Use: No  . Sexual Activity: Not on file   Other Topics Concern  . Not on file   Social History Narrative  . No narrative on file    Objective:  Filed Vitals:   01/14/14 1450  BP: 132/82  Pulse: 64  Temp: 98.4 F (36.9 C)  Resp: 16     General appearance: alert, cooperative and appears stated age Ears: normal TM's and external ear canals both ears Throat: lips, mucosa, and tongue normal; teeth and gums normal Neck: no adenopathy, no carotid bruit, supple,  symmetrical, trachea midline and thyroid not enlarged, symmetric, no tenderness/mass/nodules Back: symmetric, no curvature. ROM normal. No CVA tenderness. Lungs: clear to auscultation bilaterally Heart: regular rate and rhythm, S1, S2 normal, no murmur, click, rub or gallop Abdomen: soft, non-tender; bowel sounds normal; no masses,  no organomegaly Pulses: 2+ and symmetric Skin: follicular/pustular rash on chest.,  Several boils on back.  nonfluctuant Assessment and Plan:   Folliculitis He has several nonfluctuant boil  on back and recent tick bite.  Doxy advised and antibacterial soap  Obesity (BMI 30.0-34.9) To start belviq,  Return in 3 months,  10.5 lbs wt loss is goal to continue   Degeneration of lumbar or lumbosacral intervertebral disc With episodes of back pain currently his paraspinus muscles on the right side are in spasm,  Prescribed muscle relaxer and referred for PT     Updated Medication List Outpatient Encounter Prescriptions as of 01/14/2014  Medication Sig  . aspirin 81 MG tablet Take 81 mg by mouth daily.  . clonazePAM (KLONOPIN) 0.5 MG tablet TAKE 1 TABLET BY MOUTH DAILY AS NEEDED  . fluticasone (VERAMYST) 27.5 MCG/SPRAY nasal spray Place 2 sprays into the nose daily.  . Probiotic Product (ALIGN PO) Take 1 tablet by mouth daily.   . rosuvastatin (CRESTOR) 5 MG tablet Take 1 tablet (5 mg total) by mouth daily.  . cyclobenzaprine (FLEXERIL) 5 MG tablet Take 1 tablet (5 mg total) by mouth 3 (three) times daily as needed for muscle spasms.  Marland Kitchen. doxycycline (VIBRA-TABS) 100 MG tablet Take 1 tablet (100 mg total) by mouth 2 (two) times daily.  . Lorcaserin HCl (BELVIQ) 10 MG TABS Take 10 mg by mouth 2 (two) times daily before a meal.  . nystatin (MYCOSTATIN) powder Apply topically 2 (two) times daily.  . promethazine (PHENERGAN) 25 MG suppository Place 1 suppository (25 mg total) rectally every 6 (six) hours as needed for nausea.  . [DISCONTINUED] lansoprazole (PREVACID  SOLUTAB) 30 MG disintegrating tablet TAKE ONE CAPSULE BY MOUTH DAILY     No orders of the defined types were placed in this encounter.    No Follow-up on file.

## 2014-01-14 NOTE — Assessment & Plan Note (Addendum)
He has several nonfluctuant boil  on back and recent tick bite.  Doxy advised and antibacterial soap

## 2014-01-14 NOTE — Assessment & Plan Note (Signed)
To start belviq,  Return in 3 months,  10.5 lbs wt loss is goal to continue

## 2014-01-15 NOTE — Assessment & Plan Note (Signed)
With episodes of back pain currently his paraspinus muscles on the right side are in spasm,  Prescribed muscle relaxer and referred for PT

## 2014-01-18 ENCOUNTER — Ambulatory Visit: Payer: BC Managed Care – PPO | Admitting: Internal Medicine

## 2014-04-12 ENCOUNTER — Telehealth: Payer: Self-pay | Admitting: Internal Medicine

## 2014-04-12 DIAGNOSIS — E669 Obesity, unspecified: Secondary | ICD-10-CM

## 2014-04-12 NOTE — Telephone Encounter (Signed)
i cannot refill without an office visit,  He was seen a year ago and was supposed to return in 3 months   Refill denied

## 2014-04-12 NOTE — Telephone Encounter (Signed)
Review of chart the last refill I see for this medication is 9.29.14.  Please advise

## 2014-04-12 NOTE — Telephone Encounter (Signed)
Spoke with pt advised of MDs message.  He has appoint of 9.2.15.

## 2014-04-12 NOTE — Telephone Encounter (Signed)
The patient loss the last prescription   Lorcaserin HCl (BELVIQ) 10 MG TABS

## 2014-04-17 ENCOUNTER — Ambulatory Visit (INDEPENDENT_AMBULATORY_CARE_PROVIDER_SITE_OTHER): Payer: BC Managed Care – PPO | Admitting: Internal Medicine

## 2014-04-17 ENCOUNTER — Other Ambulatory Visit: Payer: Self-pay | Admitting: Internal Medicine

## 2014-04-17 ENCOUNTER — Encounter: Payer: Self-pay | Admitting: Internal Medicine

## 2014-04-17 VITALS — BP 126/78 | HR 81 | Temp 98.4°F | Resp 16 | Ht 70.0 in | Wt 206.8 lb

## 2014-04-17 DIAGNOSIS — E785 Hyperlipidemia, unspecified: Secondary | ICD-10-CM

## 2014-04-17 DIAGNOSIS — R5382 Chronic fatigue, unspecified: Secondary | ICD-10-CM

## 2014-04-17 DIAGNOSIS — Z79899 Other long term (current) drug therapy: Secondary | ICD-10-CM

## 2014-04-17 DIAGNOSIS — R5383 Other fatigue: Secondary | ICD-10-CM

## 2014-04-17 DIAGNOSIS — R5381 Other malaise: Secondary | ICD-10-CM

## 2014-04-17 DIAGNOSIS — E669 Obesity, unspecified: Secondary | ICD-10-CM

## 2014-04-17 MED ORDER — PHENTERMINE HCL 37.5 MG PO TABS: ORAL_TABLET | ORAL | Status: DC

## 2014-04-17 NOTE — Patient Instructions (Signed)
I have authorized the use of phentermine for 3 months.  Please have your vital signs checked a week after starting, and return to see me in 3 months  Take 1/2 tablet in the morning,  The second half by 2 PM to avoid insomnia .  If you have not lost  5% of your starting weight by the end of the  3 months (10 lbs)  we will have to discontinue it.   Check bp and pulse after a few days.  Stop medication   if BP > 150 or pulse > 110

## 2014-04-17 NOTE — Progress Notes (Signed)
Pre-visit discussion using our clinic review tool. No additional management support is needed unless otherwise documented below in the visit note.  Patient Active Problem List   Diagnosis Date Noted  . Degeneration of lumbar or lumbosacral intervertebral disc 01/14/2014  . Folliculitis 44/10/4740  . Diaper candidiasis 01/14/2014  . Stasis edema of left lower extremity 05/14/2013  . Obesity (BMI 30.0-34.9) 08/28/2012  . Bronchitis 08/28/2012  . Cough 02/23/2012  . Incidental pulmonary nodule, > 44mm and < 74mm 02/21/2012  . 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 Isolated or specific phobia 01/21/2012  . Chest pain on respiration 01/21/2012  . Hyperlipidemia LDL goal < 130 05/09/2011  . Treadmill stress test negative for angina pectoris   . Anxiety state, unspecified   . Eosinophilic esophagitis   . Irritable bowel syndrome   . Hemorrhoids     Subjective:  CC:   Chief Complaint  Patient presents with  . Follow-up    3 month    HPI:   Ronald Cordova is a 48 y.o. male who presents for follow up on obesity. He has been working hard to lose weight through diet and exercise and has made some progress but has not been able to get to goal.  He is exercising regularly but has increased appetite.   Past Medical History  Diagnosis Date  . Treadmill stress test negative for angina pectoris 2003  . Anxiety state, unspecified   . GERD (gastroesophageal reflux disease)     carafate added by Dr. Collene Mares  . Polyp of colon, hyperplastic 01/08/2011    Juanita Craver, repeat due 2018  . Eosinophilic esophagitis 5956    by EGD with  biopsies  . Irritable bowel syndrome   . Hemorrhoids     Past Surgical History  Procedure Laterality Date  . Nasal sinus surgery  1998    bilateral  . Knee arthroscopy  2000    left  . Joint replacement    . Carpal tunnel release  Sept 2008    R hand, by  [place], [place]   . Appendectomy      done during high school       The following portions of  the patient's history were reviewed and updated as appropriate: Allergies, current medications, and problem list.    Review of Systems:   Patient denies headache, fevers, malaise, unintentional weight loss, skin rash, eye pain, sinus congestion and sinus pain, sore throat, dysphagia,  hemoptysis , cough, dyspnea, wheezing, chest pain, palpitations, orthopnea, edema, abdominal pain, nausea, melena, diarrhea, constipation, flank pain, dysuria, hematuria, urinary  Frequency, nocturia, numbness, tingling, seizures,  Focal weakness, Loss of consciousness,  Tremor, insomnia, depression, anxiety, and suicidal ideation.     History   Social History  . Marital Status: Single    Spouse Name: N/A    Number of Children: N/A  . Years of Education: N/A   Occupational History  . Not on file.   Social History Main Topics  . Smoking status: Former Smoker -- 1.00 packs/day for 15 years    Types: Cigarettes    Quit date: 05/17/2011  . Smokeless tobacco: Never Used  . Alcohol Use: Yes     Comment: occasional  . Drug Use: No  . Sexual Activity: Not on file   Other Topics Concern  . Not on file   Social History Narrative  . No narrative on file    Objective:  Filed Vitals:   04/17/14 1542  BP: 126/78  Pulse: 81  Temp: 98.4 F (36.9 C)  Resp: 16     General appearance: alert, cooperative and appears stated age Ears: normal TM's and external ear canals both ears Throat: lips, mucosa, and tongue normal; teeth and gums normal Neck: no adenopathy, no carotid bruit, supple, symmetrical, trachea midline and thyroid not enlarged, symmetric, no tenderness/mass/nodules Back: symmetric, no curvature. ROM normal. No CVA tenderness. Lungs: clear to auscultation bilaterally Heart: regular rate and rhythm, S1, S2 normal, no murmur, click, rub or gallop Abdomen: soft, non-tender; bowel sounds normal; no masses,  no organomegaly Pulses: 2+ and symmetric Skin: Skin color, texture, turgor normal. No  rashes or lesions Lymph nodes: Cervical, supraclavicular, and axillary nodes normal.  Assessment and Plan:  No problem-specific assessment & plan notes found for this encounter.   Updated Medication List Outpatient Encounter Prescriptions as of 04/17/2014  Medication Sig  . aspirin 81 MG tablet Take 81 mg by mouth daily.  . clonazePAM (KLONOPIN) 0.5 MG tablet TAKE 1 TABLET BY MOUTH DAILY AS NEEDED  . cyclobenzaprine (FLEXERIL) 5 MG tablet Take 1 tablet (5 mg total) by mouth 3 (three) times daily as needed for muscle spasms.  . DEXILANT 60 MG capsule Take 1 capsule by mouth daily.  . fluticasone (VERAMYST) 27.5 MCG/SPRAY nasal spray Place 2 sprays into the nose daily.  Marland Kitchen nystatin (MYCOSTATIN) powder Apply topically 2 (two) times daily.  . Probiotic Product (ALIGN PO) Take 1 tablet by mouth daily.   . [DISCONTINUED] rosuvastatin (CRESTOR) 5 MG tablet Take 1 tablet (5 mg total) by mouth daily.  . phentermine (ADIPEX-P) 37.5 MG tablet 1/2 tablet up to twice daily for appetite suppression  . [DISCONTINUED] Lorcaserin HCl (BELVIQ) 10 MG TABS Take 10 mg by mouth 2 (two) times daily before a meal.  . [DISCONTINUED] promethazine (PHENERGAN) 25 MG suppository Place 1 suppository (25 mg total) rectally every 6 (six) hours as needed for nausea.     Orders Placed This Encounter  Procedures  . Comp Met (CMET)  . T4 AND TSH  . CBC with Differential  . LDL cholesterol, direct    Return in about 3 months (around 07/17/2014).

## 2014-04-18 LAB — CBC WITH DIFFERENTIAL/PLATELET
BASOS PCT: 0.5 % (ref 0.0–3.0)
Basophils Absolute: 0 10*3/uL (ref 0.0–0.1)
Eosinophils Absolute: 0.4 10*3/uL (ref 0.0–0.7)
Eosinophils Relative: 4.2 % (ref 0.0–5.0)
HCT: 45.4 % (ref 39.0–52.0)
Hemoglobin: 15 g/dL (ref 13.0–17.0)
Lymphocytes Relative: 37.3 % (ref 12.0–46.0)
Lymphs Abs: 3.2 10*3/uL (ref 0.7–4.0)
MCHC: 33.1 g/dL (ref 30.0–36.0)
MCV: 91.8 fl (ref 78.0–100.0)
MONO ABS: 0.6 10*3/uL (ref 0.1–1.0)
MONOS PCT: 6.6 % (ref 3.0–12.0)
NEUTROS PCT: 51.4 % (ref 43.0–77.0)
Neutro Abs: 4.4 10*3/uL (ref 1.4–7.7)
PLATELETS: 270 10*3/uL (ref 150.0–400.0)
RBC: 4.95 Mil/uL (ref 4.22–5.81)
RDW: 13.6 % (ref 11.5–15.5)
WBC: 8.6 10*3/uL (ref 4.0–10.5)

## 2014-04-18 LAB — COMPREHENSIVE METABOLIC PANEL
ALBUMIN: 4.2 g/dL (ref 3.5–5.2)
ALK PHOS: 58 U/L (ref 39–117)
ALT: 34 U/L (ref 0–53)
AST: 25 U/L (ref 0–37)
BUN: 15 mg/dL (ref 6–23)
CO2: 28 meq/L (ref 19–32)
Calcium: 9.8 mg/dL (ref 8.4–10.5)
Chloride: 105 mEq/L (ref 96–112)
Creatinine, Ser: 1.1 mg/dL (ref 0.4–1.5)
GFR: 73.01 mL/min (ref >60.00)
GLUCOSE: 85 mg/dL (ref 70–99)
Potassium: 4.6 mEq/L (ref 3.5–5.1)
SODIUM: 141 meq/L (ref 135–145)
Total Bilirubin: 0.7 mg/dL (ref 0.2–1.2)
Total Protein: 7.3 g/dL (ref 6.0–8.3)

## 2014-04-18 LAB — T4 AND TSH
T4 TOTAL: 7.7 ug/dL (ref 4.5–12.0)
TSH: 0.869 u[IU]/mL (ref 0.450–4.500)

## 2014-04-18 LAB — LDL CHOLESTEROL, DIRECT: Direct LDL: 102.3 mg/dL

## 2014-04-19 ENCOUNTER — Encounter: Payer: Self-pay | Admitting: *Deleted

## 2014-04-20 NOTE — Assessment & Plan Note (Signed)
I have congratulated him  in reduction of   BMI and encouraged Continued weight loss .  he has had difficulty getting to goal due to increased appetite and is requesting a trial of  Phentermine.  S\he is aware of the possible side effects and risks and understands that    The medication will be discontinued if she has not lost 5% of her body weight over the next 3 months, which , based on today's weight is 11 lbs.

## 2014-04-23 ENCOUNTER — Encounter: Payer: Self-pay | Admitting: Internal Medicine

## 2014-04-23 ENCOUNTER — Ambulatory Visit (INDEPENDENT_AMBULATORY_CARE_PROVIDER_SITE_OTHER): Payer: BC Managed Care – PPO | Admitting: Internal Medicine

## 2014-04-23 VITALS — BP 123/80 | HR 66 | Temp 98.1°F | Resp 14 | Ht 70.0 in | Wt 206.0 lb

## 2014-04-23 DIAGNOSIS — J020 Streptococcal pharyngitis: Secondary | ICD-10-CM

## 2014-04-23 DIAGNOSIS — J029 Acute pharyngitis, unspecified: Secondary | ICD-10-CM | POA: Insufficient documentation

## 2014-04-23 LAB — POCT RAPID STREP A (OFFICE): RAPID STREP A SCREEN: NEGATIVE

## 2014-04-23 MED ORDER — NYSTATIN 100000 UNIT/GM EX CREA: 1.0000 "application " | TOPICAL_CREAM | Freq: Two times a day (BID) | CUTANEOUS | Status: DC

## 2014-04-23 MED ORDER — LEVOFLOXACIN 500 MG PO TABS: 500.0000 mg | ORAL_TABLET | Freq: Every day | ORAL | Status: DC

## 2014-04-23 NOTE — Patient Instructions (Signed)
You currently have a viral  Syndrome .  The post nasal drip is causing your sore throat.  Lavage your sinuses twice daily with Simply saline nasal spray.  Use benadryl 25 mg every 8 hours and Sudafed PE 10 to 30 every 8 hours to manage the drainage and congestion.  Gargle with salt water often for the sore throat.  If the throat is no better  In 3 to 4 days OR  if you develop T > 100.4,  Green/brown  nasal discharge,  Or facial pain,  Start the levaquin.  Please take a probiotic ( Align, Flora jen or Culturelle) while you are on the antibiotic to prevent a serious antibiotic associated diarrhea  Called clostirudium dificile colitis

## 2014-04-23 NOTE — Assessment & Plan Note (Signed)
Rapid strep test is negative and patient is afebrile.  Left TM is cloudy but no injected, and myringotomy tube in right ear is in place.  Will treat supportively,  rx for levaquin given  With instructions  to start if patient develops fever, facial or ear pain or purulent discharge,

## 2014-04-23 NOTE — Progress Notes (Signed)
Patient ID: Ronald Cordova, male   DOB: 11-17-1965, 48 y.o.   MRN: [id number]  Patient Active Problem List   Diagnosis Date Noted  . Acute pharyngitis 04/23/2014  . Degeneration of lumbar or lumbosacral intervertebral disc 01/14/2014  . Folliculitis 01/14/2014  . Diaper candidiasis 01/14/2014  . Stasis edema of left lower extremity 05/14/2013  . Obesity (BMI 30.0-34.9) 08/28/2012  . Cough 02/23/2012  . Incidental pulmonary nodule, > 3mm and < 8mm 02/21/2012  . 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 Isolated or specific phobia 01/21/2012  . Chest pain on respiration 01/21/2012  . Hyperlipidemia LDL goal < 130 05/09/2011  . Treadmill stress test negative for angina pectoris   . Anxiety state, unspecified   . Eosinophilic esophagitis   . Irritable bowel syndrome   . Hemorrhoids     Subjective:  CC:   Chief Complaint  Patient presents with  . Sore Throat    onset saturday    HPI:   Ronald Cordova is a 48 y.o. male who presents for Sore throat, sinus drainage, cervical lymphadenopahthy .  Symptoms have been present for 2 days,  Started after spending the night in a hotel room that had a stale, mildewy odor coming from the air conditioning unit.  No body aches, fevers, purulent sinus drainage or headache.  No cough or dyspnea  Became concerned when he saw a white patch on his left tonsillar pillars.    Past Medical History  Diagnosis Date  . Treadmill stress test negative for angina pectoris 2003  . Anxiety state, unspecified   . GERD (gastroesophageal reflux disease)     carafate added by Dr. Loreta Ave  . Polyp of colon, hyperplastic 01/08/2011    [name], repeat due 2018  . Eosinophilic esophagitis 2010    by EGD with  biopsies  . Irritable bowel syndrome   . Hemorrhoids     Past Surgical History  Procedure Laterality Date  . Nasal sinus surgery  1998    bilateral  . Knee arthroscopy  2000    left  . Joint replacement    . Carpal tunnel release  Sept 2008    R  hand, by  [place], [place]   . Appendectomy      done during high school       The following portions of the patient's history were reviewed and updated as appropriate: Allergies, current medications, and problem list.    Review of Systems:   Patient denies headache, fevers, malaise, unintentional weight loss, skin rash, eye pain, sinus congestion and sinus pain, sore throat, dysphagia,  hemoptysis , cough, dyspnea, wheezing, chest pain, palpitations, orthopnea, edema, abdominal pain, nausea, melena, diarrhea, constipation, flank pain, dysuria, hematuria, urinary  Frequency, nocturia, numbness, tingling, seizures,  Focal weakness, Loss of consciousness,  Tremor, insomnia, depression, anxiety, and suicidal ideation.     History   Social History  . Marital Status: Single    Spouse Name: N/A    Number of Children: N/A  . Years of Education: N/A   Occupational History  . Not on file.   Social History Main Topics  . Smoking status: Former Smoker -- 1.00 packs/day for 15 years    Types: Cigarettes    Quit date: 05/17/2011  . Smokeless tobacco: Never Used  . Alcohol Use: Yes     Comment: occasional  . Drug Use: No  . Sexual Activity: Not on file   Other Topics Concern  . Not on file   Social  History Narrative  . No narrative on file    Objective:  Filed Vitals:   04/23/14 1305  BP: 123/80  Pulse: 66  Temp: 98.1 F (36.7 C)  Resp: 14     General appearance: alert, cooperative and appears stated age Ears: right myringotomy tube in place, TM normal, left TM with effusion , no erythema or bulging,  Throat: erythema withut ulcerations or edema.  Tonsil stones on left noted Neck: cervical lymphdenopathy, no carotid bruit, supple, symmetrical, trachea midline and thyroid not enlarged, symmetric, no tenderness/mass/nodules Back: symmetric, no curvature. ROM normal. No CVA tenderness. Lungs: clear to auscultation bilaterally Heart: regular rate and rhythm, S1, S2 normal,  no murmur, click, rub or gallop Skin: Skin color, texture, turgor normal. No rashes or lesions  Assessment and Plan:  Acute pharyngitis Rapid strep test is negative and patient is afebrile.  Left TM is cloudy but no injected, and myringotomy tube in right ear is in place.  Will treat supportively,  rx for levaquin given  With instructions  to start if patient develops fever, facial or ear pain or purulent discharge,    Updated Medication List Outpatient Encounter Prescriptions as of 04/23/2014  Medication Sig  . aspirin 81 MG tablet Take 81 mg by mouth daily.  . clonazePAM (KLONOPIN) 0.5 MG tablet TAKE 1 TABLET BY MOUTH DAILY AS NEEDED  . cyclobenzaprine (FLEXERIL) 5 MG tablet Take 1 tablet (5 mg total) by mouth 3 (three) times daily as needed for muscle spasms.  . DEXILANT 60 MG capsule Take 1 capsule by mouth daily.  . fluticasone (VERAMYST) 27.5 MCG/SPRAY nasal spray Place 2 sprays into the nose daily.  . phentermine (ADIPEX-P) 37.5 MG tablet 1/2 tablet up to twice daily for appetite suppression  . Probiotic Product (ALIGN PO) Take 1 tablet by mouth daily.   . rosuvastatin (CRESTOR) 5 MG tablet TAKE 1 TABLET BY MOUTH EVERY DAY.  . [DISCONTINUED] nystatin (MYCOSTATIN) powder Apply topically 2 (two) times daily.  Marland Kitchen levofloxacin (LEVAQUIN) 500 MG tablet Take 1 tablet (500 mg total) by mouth daily.  Marland Kitchen nystatin cream (MYCOSTATIN) Apply 1 application topically 2 (two) times daily.     Orders Placed This Encounter  Procedures  . POCT rapid strep A    No Follow-up on file.

## 2014-04-23 NOTE — Progress Notes (Signed)
Pre visit review using our clinic review tool, if applicable. No additional management support is needed unless otherwise documented below in the visit note. 

## 2014-05-13 ENCOUNTER — Other Ambulatory Visit: Payer: Self-pay | Admitting: Internal Medicine

## 2014-05-13 NOTE — Telephone Encounter (Signed)
Ok refill? 

## 2014-07-16 ENCOUNTER — Other Ambulatory Visit: Payer: Self-pay | Admitting: *Deleted

## 2014-07-16 MED ORDER — CLONAZEPAM 0.5 MG PO TABS: 0.5000 mg | ORAL_TABLET | Freq: Every day | ORAL | Status: DC | PRN

## 2014-07-16 NOTE — Telephone Encounter (Signed)
90 day supply authorized and printed  

## 2014-07-16 NOTE — Telephone Encounter (Signed)
Ok refill? Last visit 04/23/14

## 2014-07-17 ENCOUNTER — Telehealth: Payer: Self-pay | Admitting: *Deleted

## 2014-07-17 NOTE — Telephone Encounter (Signed)
Pt coming in tomorrow what labs and dx?  

## 2014-07-17 NOTE — Telephone Encounter (Signed)
Called to pharmacy 

## 2014-07-17 NOTE — Telephone Encounter (Signed)
He does not need any,  Had normal labs in September

## 2014-07-18 ENCOUNTER — Other Ambulatory Visit: Payer: BC Managed Care – PPO

## 2014-07-22 ENCOUNTER — Ambulatory Visit: Payer: BC Managed Care – PPO | Admitting: Internal Medicine

## 2014-08-23 ENCOUNTER — Telehealth: Payer: Self-pay

## 2014-08-23 MED ORDER — SCOPOLAMINE 1 MG/3DAYS TD PT72: 1.0000 | MEDICATED_PATCH | TRANSDERMAL | Status: DC

## 2014-08-23 NOTE — Telephone Encounter (Signed)
Please advise 

## 2014-08-23 NOTE — Telephone Encounter (Signed)
The patient called and is hoping to get an rx for motion sickness (he described the patches that go behind an ear) for a cruise he is taking next week.   Callback [phone]- [phone]  Pharmacy - [facility] [address]

## 2014-08-23 NOTE — Telephone Encounter (Signed)
I have called in Scopalamine patches for him

## 2014-08-23 NOTE — Telephone Encounter (Signed)
Patient notified

## 2014-09-11 ENCOUNTER — Other Ambulatory Visit: Payer: Self-pay | Admitting: Internal Medicine

## 2014-10-10 ENCOUNTER — Other Ambulatory Visit (HOSPITAL_COMMUNITY): Payer: Self-pay

## 2014-10-10 ENCOUNTER — Other Ambulatory Visit: Payer: Self-pay

## 2014-10-28 ENCOUNTER — Encounter (HOSPITAL_COMMUNITY): Payer: Self-pay | Admitting: Emergency Medicine

## 2014-10-28 ENCOUNTER — Telehealth: Payer: Self-pay | Admitting: Internal Medicine

## 2014-10-28 ENCOUNTER — Emergency Department (HOSPITAL_COMMUNITY)
Admission: EM | Admit: 2014-10-28 | Discharge: 2014-10-28 | Disposition: A | Discharge status: Home / Self Care | Payer: BLUE CROSS/BLUE SHIELD | Attending: Emergency Medicine | Admitting: Emergency Medicine

## 2014-10-28 ENCOUNTER — Emergency Department (HOSPITAL_COMMUNITY): Payer: BLUE CROSS/BLUE SHIELD

## 2014-10-28 DIAGNOSIS — F411 Generalized anxiety disorder: Secondary | ICD-10-CM | POA: Diagnosis not present

## 2014-10-28 DIAGNOSIS — Z136 Encounter for screening for cardiovascular disorders: Secondary | ICD-10-CM | POA: Diagnosis not present

## 2014-10-28 DIAGNOSIS — Z7951 Long term (current) use of inhaled steroids: Secondary | ICD-10-CM | POA: Insufficient documentation

## 2014-10-28 DIAGNOSIS — Z86018 Personal history of other benign neoplasm: Secondary | ICD-10-CM | POA: Insufficient documentation

## 2014-10-28 DIAGNOSIS — R079 Chest pain, unspecified: Secondary | ICD-10-CM | POA: Diagnosis not present

## 2014-10-28 DIAGNOSIS — Z880 Allergy status to penicillin: Secondary | ICD-10-CM | POA: Insufficient documentation

## 2014-10-28 DIAGNOSIS — Z87891 Personal history of nicotine dependence: Secondary | ICD-10-CM | POA: Diagnosis not present

## 2014-10-28 DIAGNOSIS — Z8719 Personal history of other diseases of the digestive system: Secondary | ICD-10-CM | POA: Insufficient documentation

## 2014-10-28 LAB — CBC
HCT: 47.7 % (ref 39.0–52.0)
Hemoglobin: 15.6 g/dL (ref 13.0–17.0)
MCH: 29.7 pg (ref 26.0–34.0)
MCHC: 32.7 g/dL (ref 30.0–36.0)
MCV: 90.9 fL (ref 78.0–100.0)
Platelets: 278 10*3/uL (ref 150–400)
RBC: 5.25 MIL/uL (ref 4.22–5.81)
RDW: 13.6 % (ref 11.5–15.5)
WBC: 9.6 10*3/uL (ref 4.0–10.5)

## 2014-10-28 LAB — BASIC METABOLIC PANEL
ANION GAP: 8 (ref 5–15)
BUN: 11 mg/dL (ref 4–21)
BUN: 11 mg/dL (ref 6–23)
CO2: 28 mmol/L (ref 19–32)
CREATININE: 1.1 mg/dL (ref 0.6–1.3)
Calcium: 9.6 mg/dL (ref 8.4–10.5)
Chloride: 105 mmol/L (ref 96–112)
Creatinine, Ser: 1.1 mg/dL (ref 0.50–1.35)
Creatinine: 1.6 mg/dL — AB (ref 0.6–1.3)
GFR, EST NON AFRICAN AMERICAN: 78 mL/min — AB (ref >90)
GLUCOSE: 119 mg/dL
Glucose, Bld: 119 mg/dL — ABNORMAL HIGH (ref 70–99)
POTASSIUM: 4 mmol/L (ref 3.5–5.1)
Sodium: 141 mmol/L (ref 135–145)

## 2014-10-28 LAB — I-STAT TROPONIN, ED: Troponin i, poc: 0 ng/mL (ref 0.00–0.08)

## 2014-10-28 LAB — CBC AND DIFFERENTIAL: WBC: 9.6 10*3/mL

## 2014-10-28 LAB — HEPATIC FUNCTION PANEL
ALT: 36 U/L (ref 0–53)
AST: 24 U/L (ref 0–37)
Albumin: 4.1 g/dL (ref 3.5–5.2)
Alkaline Phosphatase: 66 U/L (ref 39–117)
Bilirubin, Direct: <0.1 mg/dL (ref 0.0–0.5)
Total Bilirubin: 0.5 mg/dL (ref 0.3–1.2)
Total Protein: 6.7 g/dL (ref 6.0–8.3)

## 2014-10-28 LAB — D-DIMER, QUANTITATIVE (NOT AT ARMC)

## 2014-10-28 MED ORDER — DIAZEPAM 5 MG PO TABS
5.0000 mg | ORAL_TABLET | Freq: Once | ORAL | Status: DC
  Filled 2014-10-28: qty 1

## 2014-10-28 MED ORDER — ASPIRIN 325 MG PO TABS
325.0000 mg | ORAL_TABLET | Freq: Once | ORAL | Status: DC
  Filled 2014-10-28: qty 1

## 2014-10-28 MED ORDER — GI COCKTAIL ~~LOC~~
30.0000 mL | Freq: Once | ORAL | Status: AC
  Administered 2014-10-28: 30 mL via ORAL
  Filled 2014-10-28: qty 30

## 2014-10-28 NOTE — ED Provider Notes (Signed)
CSN: [id number]     Arrival date & time 10/28/14  1521 History   First MD Initiated Contact with Patient 10/28/14 1802     Chief Complaint  Patient presents with  . Chest Pain     (Consider location/radiation/quality/duration/timing/severity/associated sxs/prior Treatment) Patient is a 49 y.o. male presenting with chest pain.  Chest Pain Pain location:  R chest Pain quality: pressure, sharp and stabbing   Pain radiates to the back: no   Pain severity:  Moderate Onset quality:  Gradual Duration:  2 days Timing:  Constant Progression:  Unchanged Chronicity:  New Context: at rest   Relieved by:  Nothing Worsened by:  Deep breathing (palpation of chest wall) Ineffective treatments:  None tried Associated symptoms: no abdominal pain, no fever, no lower extremity edema, no nausea, no shortness of breath and not vomiting     Past Medical History  Diagnosis Date  . Treadmill stress test negative for angina pectoris 2003  . Anxiety state, unspecified   . GERD (gastroesophageal reflux disease)     carafate added by Dr. Collene Mares  . Polyp of colon, hyperplastic 01/08/2011    Juanita Craver, repeat due 2018  . Eosinophilic esophagitis 1916    by EGD with  biopsies  . Irritable bowel syndrome   . Hemorrhoids    Past Surgical History  Procedure Laterality Date  . Nasal sinus surgery  1998    bilateral  . Knee arthroscopy  2000    left  . Joint replacement    . Carpal tunnel release  Sept 2008    R hand, by  [place], [place]   . Appendectomy      done during high school   Family History  Problem Relation Age of Onset  . Hyperlipidemia Sister   . Hyperlipidemia Brother   . BRCA 1/2 Maternal Aunt    History  Substance Use Topics  . Smoking status: Former Smoker -- 1.00 packs/day for 15 years    Types: Cigarettes    Quit date: 05/17/2011  . Smokeless tobacco: Never Used  . Alcohol Use: Yes     Comment: occasional    Review of Systems  Constitutional: Negative for fever.   Respiratory: Negative for shortness of breath.   Cardiovascular: Positive for chest pain.  Gastrointestinal: Negative for nausea, vomiting and abdominal pain.  All other systems reviewed and are negative.     Allergies  Penicillins  Home Medications   Prior to Admission medications   Medication Sig Start Date End Date Taking? Authorizing Provider  aspirin 81 MG tablet Take 81 mg by mouth daily.    Historical Provider, MD  clonazePAM (KLONOPIN) 0.5 MG tablet Take 1 tablet (0.5 mg total) by mouth daily as needed. 07/16/14   Crecencio Mc, MD  CRESTOR 5 MG tablet TAKE 1 TABLET BY MOUTH EVERY DAY. 09/12/14   Crecencio Mc, MD  cyclobenzaprine (FLEXERIL) 5 MG tablet Take 1 tablet (5 mg total) by mouth 3 (three) times daily as needed for muscle spasms. 01/14/14   Crecencio Mc, MD  DEXILANT 60 MG capsule Take 1 capsule by mouth daily. 01/28/14   Historical Provider, MD  fluticasone (VERAMYST) 27.5 MCG/SPRAY nasal spray Place 2 sprays into the nose daily. 02/08/13   Crecencio Mc, MD  levofloxacin (LEVAQUIN) 500 MG tablet Take 1 tablet (500 mg total) by mouth daily. 04/23/14   Crecencio Mc, MD  nystatin (MYCOSTATIN/NYSTOP) 100000 UNIT/GM POWD APPLY TOPICALLY 2 TIMES DAILY. 05/13/14   Crecencio Mc,  MD  nystatin cream (MYCOSTATIN) Apply 1 application topically 2 (two) times daily. 04/23/14   Crecencio Mc, MD  phentermine (ADIPEX-P) 37.5 MG tablet 1/2 tablet up to twice daily for appetite suppression 04/17/14   Crecencio Mc, MD  Probiotic Product (ALIGN PO) Take 1 tablet by mouth daily.     Historical Provider, MD  scopolamine (TRANSDERM-SCOP) 1 MG/3DAYS Place 1 patch (1.5 mg total) onto the skin every 3 (three) days. 08/23/14   Jackolyn Confer, MD   BP 147/102 mmHg  Pulse 87  Temp(Src) 98.6 F (37 C) (Oral)  Resp 21  Ht _0  (1.778 m)  Wt 204 lb 14.4 oz (92.942 kg)  BMI 29.40 kg/m2  SpO2 97% Physical Exam  Constitutional: He is oriented to person, place, and time. He appears  well-developed and well-nourished.  HENT:  Head: Normocephalic and atraumatic.  Eyes: Conjunctivae and EOM are normal.  Neck: Normal range of motion. Neck supple.  Cardiovascular: Normal rate, regular rhythm and normal heart sounds.   Pulmonary/Chest: Effort normal and breath sounds normal. No respiratory distress. He exhibits tenderness and bony tenderness (R costal margin).  Abdominal: He exhibits no distension. There is tenderness in the right upper quadrant and epigastric area. There is no rebound and no guarding.  Musculoskeletal: Normal range of motion.  Neurological: He is alert and oriented to person, place, and time.  Skin: Skin is warm and dry.  Vitals reviewed.   ED Course  Procedures (including critical care time) Labs Review Labs Reviewed  BASIC METABOLIC PANEL - Abnormal; Notable for the following:    Glucose, Bld 119 (*)    GFR calc non Af Amer 78 (*)    All other components within normal limits  CBC  HEPATIC FUNCTION PANEL  D-DIMER, QUANTITATIVE  I-STAT TROPOININ, ED    Imaging Review Dg Chest 2 View  10/28/2014   CLINICAL DATA:  Chest pain  EXAM: CHEST  2 VIEW  COMPARISON:  02/18/2012  FINDINGS: Normal heart size. Clear lungs. No pneumothorax or pleural effusion.  IMPRESSION: No active cardiopulmonary disease.   Electronically Signed   By: Marybelle Killings M.D.   On: 10/28/2014 16:15     EKG Interpretation   Date/Time:  Monday October 28 2014 18:13:43 EDT Ventricular Rate:  83 PR Interval:  132 QRS Duration: 86 QT Interval:  410 QTC Calculation: 482 R Axis:   30 Text Interpretation:  Sinus rhythm Borderline prolonged QT interval No  significant change since last tracing Confirmed by Debby Freiberg [phone])  on 10/28/2014 6:23:42 PM      MDM   Final diagnoses:  Chest pain, unspecified chest pain type    49 y.o. male with pertinent PMH of prior chest pain presents with recurrent chest pain as above.  History atypical for ACS, intermittent, worse with  palpation of chest wall.  3 small pustules/vesicles on inferior anterior costal margin, however no percutaneous hyperesthesia.  Likely costochondritis given negative trop with > 6 hours symptoms.  CXR also unremarkable.  LFT unremarkable.  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  Negative.  Likely spasm/radiculopathy vs costochondritis vs gerd.  Doubt ACS, PE, other emergent chest pathology.  DC home in stable condition.    I have reviewed all laboratory and imaging studies if ordered as above  1. Chest pain, unspecified chest pain type         Debby Freiberg, MD 10/28/14 1920

## 2014-10-28 NOTE — Telephone Encounter (Signed)
[facility] TELEPHONE ADVICE RECORD [facility] Patient Name: Latrelle DodrillGREGORY SCOTT KIN G DOB: [birth year] Initial Comment Caller states c/o blood pressure 149/98 Nurse Assessment Nurse: Lane HackerHarley, RN, Windy Date/Time (Eastern Time): 10/28/2014 2:17:10 PM Confirm and document reason for call. If symptomatic, describe symptoms. ---Caller states c/o BP 149/98 2 hours ago. Is that normal? He was having chest pain after moving things last night, and seen by chiropractor. He is having chest pain/tightness every now and again that lasts less than 5 min. and keeps reoccurring. Had some SOB, and went away after chiropractor visit. Has the patient traveled out of the country within the last 30 days? ---Not Applicable Does the patient require triage? ---Yes Related visit to physician within the last 2 weeks? ---No Does the PT have any chronic conditions? (i.e. diabetes, asthma, etc.) ---Yes List chronic conditions. ---Quit smoking 4 years ago. A little overweight. 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 Acid Reflux Guidelines Guideline Title Affirmed Question Affirmed Notes Chest Pain Recent long-distance travel with prolonged time in car, bus, plane, or train (i.e., within past 2 weeks; 6 or more hours duration) Final Disposition User Go to ED Now Lane HackerHarley, RN, Elvin SoWindy Comments Pt wants to wait and see, and asks about appt at office instead of ER. RN advised that ER outcome is the advice. -- Per instructions from this office, RN reached out to the office, Harriett SineNancy - practice admin staff to notify them, and I spoke with pt to let them know someone would be calling from the office.

## 2014-10-28 NOTE — Telephone Encounter (Signed)
He needs to go to ER bc this could be his heart or a blood clot in his legs and I cannot diagnose either from the office

## 2014-10-28 NOTE — Telephone Encounter (Signed)
Pt refusing ED, please advise

## 2014-10-28 NOTE — Telephone Encounter (Signed)
Patient is currently at the ER [place].

## 2014-10-28 NOTE — ED Notes (Signed)
Pt reports intermittent cp/epigasric pressure x 2 days. sts it sometimes is relieved by burping. And he had a stabbing back pain today- saw Chiropractor today and that has decreased some. Pt also reported nausea this am.

## 2014-10-28 NOTE — ED Notes (Signed)
MD at the bedside  

## 2014-10-28 NOTE — Discharge Instructions (Signed)

## 2014-10-29 ENCOUNTER — Encounter: Payer: Self-pay | Admitting: Nurse Practitioner

## 2014-10-29 ENCOUNTER — Ambulatory Visit (INDEPENDENT_AMBULATORY_CARE_PROVIDER_SITE_OTHER): Payer: BLUE CROSS/BLUE SHIELD | Admitting: Nurse Practitioner

## 2014-10-29 VITALS — BP 140/90 | HR 88 | Temp 97.8°F | Resp 12 | Ht 70.0 in | Wt 202.4 lb

## 2014-10-29 DIAGNOSIS — F4323 Adjustment disorder with mixed anxiety and depressed mood: Secondary | ICD-10-CM

## 2014-10-29 MED ORDER — VENLAFAXINE HCL 37.5 MG PO TABS: 37.5000 mg | ORAL_TABLET | Freq: Two times a day (BID) | ORAL | Status: DC

## 2014-10-29 MED ORDER — HYDROCHLOROTHIAZIDE 12.5 MG PO TABS: 12.5000 mg | ORAL_TABLET | Freq: Every day | ORAL | Status: DC

## 2014-10-29 NOTE — Progress Notes (Signed)
Pre visit review using our clinic review tool, if applicable. No additional management support is needed unless otherwise documented below in the visit note. 

## 2014-10-29 NOTE — Progress Notes (Signed)
Subjective:    Patient ID: EASTER SCHINKE, male    DOB: [birth year], 49 y.o.   MRN: [id number]  HPI  Mr. Sauceda is a 49 yo male with a CC of needed effexor refilled.   1) Mr. Holsworth went to the ER yesterday because of chest pain. He had BMET, CBC, LFT, D-Dimer, and Troponin with no significant findings. EKG- Borderline QT prolongation, no significant changes from past EKG Chest X-ray- No significant findings.  He was recently in a break up and reports feeling very sad, depressed, anxious, and thinks the chest pain is due to grief. He is not taking phentermine currently. He was previously on Effexor and feels going back on it would help him be able to deal with issues. 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   Review of Systems  Constitutional: Negative for fever, chills, diaphoresis and fatigue.  Eyes: Negative for visual disturbance.  Respiratory: Negative for chest tightness, shortness of breath and wheezing.   Cardiovascular: Negative for chest pain, palpitations and leg swelling.  Gastrointestinal: Negative for nausea, vomiting and diarrhea.  Skin: Negative for rash.  Neurological: Negative for dizziness, weakness and numbness.  Psychiatric/Behavioral: Positive for sleep disturbance. Negative for suicidal ideas. The patient is nervous/anxious.        Depressed over ended relationship   Past Medical History  Diagnosis Date  . Treadmill stress test negative for angina pectoris 2003  . Anxiety state, unspecified   . GERD (gastroesophageal reflux disease)     carafate added by Dr. Collene Mares  . Polyp of colon, hyperplastic 01/08/2011    Juanita Craver, repeat due 2018  . Eosinophilic esophagitis 7793    by EGD with  biopsies  . Irritable bowel syndrome   . Hemorrhoids     History   Social History  . Marital Status: Single    Spouse Name: N/A  . Number of Children: N/A  . Years of Education: N/A   Occupational History  . Not on  file.   Social History Main Topics  . Smoking status: Former Smoker -- 1.00 packs/day for 15 years    Types: Cigarettes    Quit date: 05/17/2011  . Smokeless tobacco: Never Used  . Alcohol Use: Yes     Comment: occasional  . Drug Use: No  . Sexual Activity: Not on file   Other Topics Concern  . Not on file   Social History Narrative    Past Surgical History  Procedure Laterality Date  . Nasal sinus surgery  1998    bilateral  . Knee arthroscopy  2000    left  . Joint replacement    . Carpal tunnel release  Sept 2008    R hand, by  [place], [place]   . Appendectomy      done during high school    Family History  Problem Relation Age of Onset  . Hyperlipidemia Sister   . Hyperlipidemia Brother   . BRCA 1/2 Maternal Aunt     Allergies  Allergen Reactions  . Penicillins     Current Outpatient Prescriptions on File Prior to Visit  Medication Sig Dispense Refill  . aspirin 81 MG tablet Take 81 mg by mouth daily.    . clonazePAM (KLONOPIN) 0.5 MG tablet Take 1 tablet (0.5 mg total) by mouth daily as needed. 90 tablet 0  . CRESTOR 5 MG tablet TAKE 1 TABLET BY MOUTH EVERY DAY. 90 tablet 1  .  DEXILANT 60 MG capsule Take 1 capsule by mouth daily.    . fluticasone (VERAMYST) 27.5 MCG/SPRAY nasal spray Place 2 sprays into the nose daily. 30 g 3  . nystatin (MYCOSTATIN/NYSTOP) 100000 UNIT/GM POWD APPLY TOPICALLY 2 TIMES DAILY. 15 g 3  . nystatin cream (MYCOSTATIN) Apply 1 application topically 2 (two) times daily. 30 g 1  . Probiotic Product (ALIGN PO) Take 1 tablet by mouth daily.      No current facility-administered medications on file prior to visit.     Objective:   Physical Exam  Constitutional: He is oriented to person, place, and time. He appears well-developed and well-nourished. No distress.  BP 140/90 mmHg  Pulse 88  Temp(Src) 97.8 F (36.6 C) (Oral)  Resp 12  Ht _0  (1.778 m)  Wt 202 lb 6.4 oz (91.808 kg)  BMI 29.04 kg/m2  SpO2 96%   HENT:  Head:  Normocephalic and atraumatic.  Right Ear: External ear normal.  Left Ear: External ear normal.  Eyes: Right eye exhibits no discharge. Left eye exhibits no discharge. No scleral icterus.  Cardiovascular: Normal rate, regular rhythm, normal heart sounds and intact distal pulses.  Exam reveals no gallop and no friction rub.   No murmur heard. Pulmonary/Chest: Effort normal and breath sounds normal. No respiratory distress. He has no wheezes. He has no rales. He exhibits no tenderness.  Neurological: He is alert and oriented to person, place, and time.  Skin: Skin is warm and dry. No rash noted. He is not diaphoretic.  Psychiatric: His behavior is normal. Judgment and thought content normal.  Tearful and anxious today      Assessment & Plan:

## 2014-10-29 NOTE — Patient Instructions (Signed)
Follow up with Dr. Darrick Huntsmanullo next week.

## 2014-11-03 DIAGNOSIS — F330 Major depressive disorder, recurrent, mild: Secondary | ICD-10-CM | POA: Insufficient documentation

## 2014-11-03 DIAGNOSIS — F339 Major depressive disorder, recurrent, unspecified: Secondary | ICD-10-CM | POA: Insufficient documentation

## 2014-11-03 NOTE — Assessment & Plan Note (Addendum)
Worsening. Rx for Effexor 37.5 mg tablet twice daily. Asked pt to seek emergent care or let someone know if he has any thoughts of hurting himself or others. Will follow.

## 2014-11-04 ENCOUNTER — Encounter: Payer: Self-pay | Admitting: Internal Medicine

## 2014-11-04 ENCOUNTER — Ambulatory Visit (INDEPENDENT_AMBULATORY_CARE_PROVIDER_SITE_OTHER): Payer: BLUE CROSS/BLUE SHIELD | Admitting: Internal Medicine

## 2014-11-04 VITALS — BP 138/72 | HR 91 | Temp 98.1°F | Resp 14 | Ht 70.0 in | Wt 203.8 lb

## 2014-11-04 DIAGNOSIS — E663 Overweight: Secondary | ICD-10-CM

## 2014-11-04 DIAGNOSIS — L739 Follicular disorder, unspecified: Secondary | ICD-10-CM

## 2014-11-04 DIAGNOSIS — M5137 Other intervertebral disc degeneration, lumbosacral region: Secondary | ICD-10-CM

## 2014-11-04 DIAGNOSIS — F331 Major depressive disorder, recurrent, moderate: Secondary | ICD-10-CM

## 2014-11-04 DIAGNOSIS — F411 Generalized anxiety disorder: Secondary | ICD-10-CM

## 2014-11-04 DIAGNOSIS — R071 Chest pain on breathing: Secondary | ICD-10-CM

## 2014-11-04 DIAGNOSIS — M51379 Other intervertebral disc degeneration, lumbosacral region without mention of lumbar back pain or lower extremity pain: Secondary | ICD-10-CM

## 2014-11-04 MED ORDER — CYCLOBENZAPRINE HCL 5 MG PO TABS: 5.0000 mg | ORAL_TABLET | Freq: Three times a day (TID) | ORAL | Status: DC | PRN

## 2014-11-04 MED ORDER — CLONAZEPAM 1 MG PO TABS: 0.5000 mg | ORAL_TABLET | Freq: Two times a day (BID) | ORAL | Status: DC | PRN

## 2014-11-04 MED ORDER — DOXYCYCLINE HYCLATE 100 MG PO CAPS: 100.0000 mg | ORAL_CAPSULE | Freq: Two times a day (BID) | ORAL | Status: DC

## 2014-11-04 MED ORDER — BUPROPION HCL ER (SR) 150 MG PO TB12: 150.0000 mg | ORAL_TABLET | Freq: Two times a day (BID) | ORAL | Status: DC

## 2014-11-04 MED ORDER — TADALAFIL 20 MG PO TABS: 10.0000 mg | ORAL_TABLET | ORAL | Status: DC | PRN

## 2014-11-04 NOTE — Patient Instructions (Addendum)
I recommend a trial of wellbutrin for your depression.  Start it twice daily ,  2nd dose by 2 pm to avoid insomnia  Reduce the effexor  to once daily   At dinner time.  After one week stop the effexor,   Increase the clonazepam to 1 mg up two twice daily as needed   doxy twice daily with food for one week to treat folliculitis rash   Flexeril for muscle spasm take as needed   cialis or viagra if insurance  will pay.  Start with 1/2 tablet

## 2014-11-04 NOTE — Progress Notes (Signed)
Patient ID: Ronald Cordova, male   DOB: 1965/11/21, 49 y.o.   MRN: [id number]  . Patient Active Problem List   Diagnosis Date Noted  . Major depressive disorder, recurrent episode 11/03/2014  . Degeneration of lumbar or lumbosacral intervertebral disc 01/14/2014  . Folliculitis 01/14/2014  . Diaper candidiasis 01/14/2014  . Stasis edema of left lower extremity 05/14/2013  . Overweight (BMI 25.0-29.9) 08/28/2012  . Incidental pulmonary nodule, > 3mm and < 8mm 02/21/2012  . 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 Isolated or specific phobia 01/21/2012  . Chest pain on respiration 01/21/2012  . Hyperlipidemia LDL goal < 130 05/09/2011  . Treadmill stress test negative for angina pectoris   . Anxiety state   . Irritable bowel syndrome   . Hemorrhoids     Subjective:  CC:   Chief Complaint  Patient presents with  . Follow-up    ER folow up from last Monday,chest pain anxiety issues.    HPI:   Ronald Cordova is a 49 y.o. male who presents for follow up on several issues.    1) major depressive epsiode triggered  by end of romantic relationship which was 9 months duration..  Patient was seen last week by NP for ER follow up for non cardiac chest pain, and low dose of  effexor was resumed for depressive symptoms.  suicidality was assessed then and he was not endorsing any ideation or plan.  He returns today with persistent sadness and fatigue, insomnia, and anhedonia, and sates that he was unable to  go to work today, which is highly unusual for him.  He is requesting a work note for the entire week.  He again reiterates that he is NOT SUICIDAL  And has no history of attempts or ideation but is simply mourning the loss of a relationship that upon further discussion,  Was sexually rewarding but emotionally unhealthy and problematic.    2) Anxiety :  He reports  Not sleeping  ,  Tossing and turning . Using clonazepam 0.5 mg with little results.  Tolerating effexor but concerned that it  will make him numb and is contributing to his fatigue.  Seeing his therapist Tammy in a few days.   3) Recurrent follicular rash on arms/chest /shoulders.  Was treated for same last year around the time of a tick bite.  Cleared up with doxycycline,  Has not been using antibacterial soap.  No recent hot tub, sauna or public gym equipment use.    Past Medical History  Diagnosis Date  . Treadmill stress test negative for angina pectoris 2003  . Anxiety state, unspecified   . GERD (gastroesophageal reflux disease)     carafate added by Dr. Loreta Ave  . Polyp of colon, hyperplastic 01/08/2011    [name], repeat due 2018  . Eosinophilic esophagitis 2010    by EGD with  biopsies  . Irritable bowel syndrome   . Hemorrhoids     Past Surgical History  Procedure Laterality Date  . Nasal sinus surgery  1998    bilateral  . Knee arthroscopy  2000    left  . Joint replacement    . Carpal tunnel release  Sept 2008    R hand, by  [place], [place]   . Appendectomy      done during high school       The following portions of the patient's history were reviewed and updated as appropriate: Allergies, current medications, and problem list.    Review  of Systems:   Patient denies headache, fevers, malaise, unintentional weight loss, skin rash, eye pain, sinus congestion and sinus pain, sore throat, dysphagia,  hemoptysis , cough, dyspnea, wheezing, chest pain, palpitations, orthopnea, edema, abdominal pain, nausea, melena, diarrhea, constipation, flank pain, dysuria, hematuria, urinary  Frequency, nocturia, numbness, tingling, seizures,  Focal weakness, Loss of consciousness,  Tremor, insomnia, depression, anxiety, and suicidal ideation.     History   Social History  . Marital Status: Single    Spouse Name: N/A  . Number of Children: N/A  . Years of Education: N/A   Occupational History  . Not on file.   Social History Main Topics  . Smoking status: Former Smoker -- 1.00 packs/day for  15 years    Types: Cigarettes    Quit date: 05/17/2011  . Smokeless tobacco: Never Used  . Alcohol Use: Yes     Comment: occasional  . Drug Use: No  . Sexual Activity: Not on file   Other Topics Concern  . Not on file   Social History Narrative    Objective:  Filed Vitals:   11/04/14 1753  BP: 138/72  Pulse: 91  Temp: 98.1 F (36.7 C)  Resp: 14     General appearance: alert, cooperative and appears stated age Neck: no adenopathy, no carotid bruit, supple, symmetrical, trachea midline and thyroid not enlarged, symmetric, no tenderness/mass/nodules Back: symmetric, no curvature. ROM restricted to flexion of 180 degrees. No CVA tenderness. Lungs: clear to auscultation bilaterally Heart: regular rate and rhythm, S1, S2 normal, no murmur, click, rub or gallop Abdomen: soft, non-tender; bowel sounds normal; no masses,  no organomegaly Pulses: 2+ and symmetric Skin: follicular macular rash,  Excoriation noted. On arms chest shoulders and neck Lymph nodes: Cervical, supraclavicular, and axillary nodes normal. Psych: affect normal, makes good eye contact, interactive.  No fidgeting,  Smiles easily.  Denies suicidal thoughts    Assessment and Plan:  Major depressive disorder, recurrent episode Transition to wellbutrin to address fatigue, concern for weight gain.  Work note given for one week.    Anxiety state No improvement with effexor and low dose clonazepam.  Will Increase clonazepam to 1 mg,  May use prn evening and prn once during the day. Work note for one week duration given.    Folliculitis Recurrence noted,  On arms , chest and back.  Doxycycline bid x 1 week. No recurrent use of sauna,  hot tub or contact with gym equipment,  Recommended using dial sopa to prevent recurrence.    Degeneration of lumbar or lumbosacral intervertebral disc He has recurrent muscle spasm due to DDD and is requesting refill on flexeril.     Chest pain on respiration Recent ER visit  ruled out Angina.  History of esophagitis,  Resume PPI   Overweight (BMI 25.0-29.9) I have addressed  BMI and recommended a low glycemic index diet utilizing smaller more frequent meals to increase metabolism.  I have also recommended that patient start exercising with a goal of 30 minutes of aerobic exercise a minimum of 5 days per week. Screening for lipid disorders, thyroid and diabetes has been done        Updated Medication List Outpatient Encounter Prescriptions as of 11/04/2014  Medication Sig  . aspirin 81 MG tablet Take 81 mg by mouth daily.  . clonazePAM (KLONOPIN) 1 MG tablet Take 0.5 tablets (0.5 mg total) by mouth 2 (two) times daily as needed.  . CRESTOR 5 MG tablet TAKE 1 TABLET BY MOUTH EVERY  DAY.  Marland Kitchen DEXILANT 60 MG capsule Take 1 capsule by mouth daily.  . fluticasone (VERAMYST) 27.5 MCG/SPRAY nasal spray Place 2 sprays into the nose daily.  . hydrochlorothiazide (HYDRODIURIL) 12.5 MG tablet Take 1 tablet (12.5 mg total) by mouth daily.  Marland Kitchen nystatin cream (MYCOSTATIN) Apply 1 application topically 2 (two) times daily.  . Probiotic Product (ALIGN PO) Take 1 tablet by mouth daily.   . [DISCONTINUED] clonazePAM (KLONOPIN) 0.5 MG tablet Take 1 tablet (0.5 mg total) by mouth daily as needed.  . [DISCONTINUED] venlafaxine (EFFEXOR) 37.5 MG tablet Take 1 tablet (37.5 mg total) by mouth 2 (two) times daily.  Marland Kitchen buPROPion (WELLBUTRIN SR) 150 MG 12 hr tablet Take 1 tablet (150 mg total) by mouth 2 (two) times daily.  . cyclobenzaprine (FLEXERIL) 5 MG tablet Take 1 tablet (5 mg total) by mouth 3 (three) times daily as needed for muscle spasms.  Marland Kitchen doxycycline (VIBRAMYCIN) 100 MG capsule Take 1 capsule (100 mg total) by mouth 2 (two) times daily.  Marland Kitchen nystatin (MYCOSTATIN/NYSTOP) 100000 UNIT/GM POWD APPLY TOPICALLY 2 TIMES DAILY. (Patient not taking: Reported on 11/04/2014)  . tadalafil (CIALIS) 20 MG tablet Take 0.5-1 tablets (10-20 mg total) by mouth every other day as needed for erectile  dysfunction.     Orders Placed This Encounter  Procedures  . CBC and differential  . Basic metabolic panel  . Basic metabolic panel    Return in about 4 weeks (around 12/02/2014).

## 2014-11-05 ENCOUNTER — Encounter: Payer: Self-pay | Admitting: Internal Medicine

## 2014-11-05 NOTE — Assessment & Plan Note (Signed)
Recurrence noted,  On arms , chest and back.  Doxycycline bid x 1 week. No recurrent use of sauna,  hot tub or contact with gym equipment,  Recommended using dial sopa to prevent recurrence.

## 2014-11-05 NOTE — Assessment & Plan Note (Signed)
No improvement with effexor and low dose clonazepam.  Will Increase clonazepam to 1 mg,  May use prn evening and prn once during the day. Work note for one week duration given.

## 2014-11-05 NOTE — Assessment & Plan Note (Addendum)
He has recurrent muscle spasm due to DDD and is requesting refill on flexeril.

## 2014-11-05 NOTE — Assessment & Plan Note (Signed)
I have addressed  BMI and recommended a low glycemic index diet utilizing smaller more frequent meals to increase metabolism.  I have also recommended that patient start exercising with a goal of 30 minutes of aerobic exercise a minimum of 5 days per week. Screening for lipid disorders, thyroid and diabetes has  been done  °

## 2014-11-05 NOTE — Assessment & Plan Note (Signed)
Recent ER visit ruled out Angina.  History of esophagitis,  Resume PPI

## 2014-11-05 NOTE — Assessment & Plan Note (Addendum)
Transition to wellbutrin to address fatigue, concern for weight gain.  Work note given for one week.

## 2014-11-11 ENCOUNTER — Telehealth: Payer: Self-pay | Admitting: Internal Medicine

## 2014-11-11 ENCOUNTER — Ambulatory Visit (INDEPENDENT_AMBULATORY_CARE_PROVIDER_SITE_OTHER): Payer: BLUE CROSS/BLUE SHIELD | Admitting: Internal Medicine

## 2014-11-11 ENCOUNTER — Encounter: Payer: Self-pay | Admitting: Internal Medicine

## 2014-11-11 VITALS — BP 120/90 | HR 91 | Temp 97.7°F | Resp 14 | Ht 70.0 in | Wt 201.5 lb

## 2014-11-11 DIAGNOSIS — L739 Follicular disorder, unspecified: Secondary | ICD-10-CM

## 2014-11-11 DIAGNOSIS — E663 Overweight: Secondary | ICD-10-CM

## 2014-11-11 DIAGNOSIS — I10 Essential (primary) hypertension: Secondary | ICD-10-CM

## 2014-11-11 MED ORDER — AMLODIPINE BESYLATE 5 MG PO TABS: 5.0000 mg | ORAL_TABLET | Freq: Every day | ORAL | Status: DC

## 2014-11-11 NOTE — Patient Instructions (Signed)
WE ARE STARTING AMLODIPINE 5 MG DAILY FOR YOUR BLOOD PRESSURE TAKE FIRST DOSE TONIGHT   YOU CAN TAKE ANOTHER DOSE IN AM IF YOUR BP IS > 140/90  This is Dr. Melina Schoolsullo's version of a  "Low GI"  Weight loss Diet.  It is appropriate for all patients with normal renal function , gluten tolerance, and advised for patients who have prediabetes or diabetes:   All of the foods can be found at grocery stores and in bulk at [employer].  The Atkins protein bars and shakes are available in more varieties at Target, WalMart and [employer].     7 AM Breakfast:  Choose from the following:  < 5 carbs  Weekdays: Low carbohydrate Protein  Shakes (EAS AdvantEdge "Carb  Control" shakes, Atkins,  Muscle Milk or Premier Protein shakes)     Weekends:  a scrambled egg/bacon/cheese burrito made with Mission's "carb  balance" whole wheat tortilla  (about 10 net carbs )  Eggs,  bacon /sausage , Joseph's pita /lavash bread or  (5 carbs)  A slice of fritatta ( egg based baked dish, no  crust:  google it) (< 10 carbs)   Avoid cereal and bananas, oatmeal and cream of wheat and grits. They are loaded with carbohydrates!   10 AM: high protein snack  (< 5 carbs)   Protein bar by Atkins  Or KIND  (the snack size, < 200 cal, usually < 6 carbs    A stick of cheese:  Around 1 carb,  100 cal      Other so called "protein bars" tend to be loaded with carbohydrates.  Remember, in food advertising, the word "energy" is synonymous for " carbohydrate."  Lunch:   A Sandwich using the bread choices listed, Can use any  Eggs,  lunchmeat, grilled meat or canned tuna).  Can add avocado, regular mayo/mustard  and cheese.  A Salad using blue cheese, ranch,  Goddess dressing  or vinagrette,  No croutons or "confetti" and no "candied nuts" but regular nuts OK.   2 HARD BOILED EGG WHITES AND A CUP OF one of these greek yogurts:    dannon lt n fit greek yogurt         chobani 100 greek yogurt,    Oikos triple zero greek yogurt       No pretzels  or chips.  Pickles and miniature sweet peppers are a good low  carb alternative that provide a "crunch"  The bread is the only source of carbohydrate in a sandwich and  can be decreased by trying some of these alternatives to traditional loaf bread:   Joseph's pita bread and Lavash (flat) bread :  50 cal and 4 net carbs  available at BJs and WalMart.  Taste better when toasted, use as pita chips  Toufayan makes a variety of  flatbreads and  A PITA POCKET.    LOOK FOR  THE ONES THAT ARE 17 NET CARBS OR LESS    Mission makes 2 sizes of  Low carb whole wheat tortillas  (The large one is  210 cal and 6 net carbs)   Avoid "Low fat dressings, as well as Reyne Dumas[place]ina and [address] dressings    3 PM/ Mid day  Snack:  Consider  1 ounce of  almonds, walnuts, pistachios, pecans, peanuts,  Macadamia nuts or a nut medley that does not contain raisins or cranberries.  No "granola"; the dried cranberries and raisins are loaded with carbohydrates. Mixed nuts as  long as there are no raisins,  cranberries or dried fruit.    Try the prosciutto/mozzarella cheese sticks by [employer]  In deli /backery section   High protein   To avoid overindulging in snacks: Try drinking a glass of unsweeted almond/coconut milk  Or a cup of coffee with your Atkins chocolate bar to keep you from having 3!!!   Pork rinds!  Yes Pork Rinds are low carb potato chip substitute!   Toasted Joseph's flatbread with hummous dip (chickpeas)       6 PM  Dinner:     Meat/fowl/fish with a green salad, and either broccoli, cauliflower, green beans, spinach, brussel sprouts, bok choy or  Lima beans. Fried in canola oil /olive oil BUT DO NOT BREAD THE PROTEIN!!      There is a low carb pasta by Dreamfield's that is acceptable and tastes great: only 5 digestible carbs/serving.( All grocery stores but BJs carry it )  Prepared Meals:  Try Kai Levins Angelo's chicken piccata or chicken or eggplant parm over low carb pasta.(Lowes and BJs)   Clifton Custard  Sanchez's "Carnitas" (pulled pork, no sauce,  0 carbs) or his beef pot roast to make a dinner burrito (at [employer])  Barbecue with cole slaw is low carb BUT NO BUN!  SAME WITH HAMBURGERS     Whole wheat pasta is still full of digestible carbs and  Not as low in glycemic index as Dreamfield's.   Brown rice is still rice,  So skip the rice and noodles if you eat [place] or [place] (or at least limit to 1/2 cup)  9 PM snack :   Breyer's "low carb" fudgsicle or  ice cream bar (Carb Smart line), or  Weight  Watcher's ice cream bar , or another "no sugar added" ice cream;  a serving of fresh berries/cherries with whipped cream   Cheese or greek yogurt   8 ounces of [place] unsweetened almond/cococunut milk  Cheese and crackers (using WASA crackers,  They are low carb) or peanut butter on low carb crackers or pita bread     Avoid bananas, pineapple, grapes  and watermelon on a regular basis because they are high in sugar.  THINK OF THEM AS DESSERT and do not have daily   Remember that snack Substitutions should be less than 10 NET carbs per serving and meals should be < 20 net carbs. Remember that carbohydrates from fiber do not affect blood sugar, so you can  subtract fiber grams to get the "net carbs " of any particular food item.

## 2014-11-11 NOTE — Progress Notes (Signed)
Pre-visit discussion using our clinic review tool. No additional management support is needed unless otherwise documented below in the visit note.  

## 2014-11-11 NOTE — Progress Notes (Signed)
Patient ID: Ronald Cordova, male   DOB: 06-01-1966, 49 y.o.   MRN: [id number]   Patient Active Problem List   Diagnosis Date Noted  . Essential hypertension 11/13/2014  . Major depressive disorder, recurrent episode 11/03/2014  . Degeneration of lumbar or lumbosacral intervertebral disc 01/14/2014  . Folliculitis 01/14/2014  . Diaper candidiasis 01/14/2014  . Stasis edema of left lower extremity 05/14/2013  . Overweight (BMI 25.0-29.9) 08/28/2012  . Incidental pulmonary nodule, > 3mm and < 8mm 02/21/2012  . 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 Isolated or specific phobia 01/21/2012  . Chest pain on respiration 01/21/2012  . Hyperlipidemia LDL goal < 130 05/09/2011  . Treadmill stress test negative for angina pectoris   . Anxiety state   . Irritable bowel syndrome   . Hemorrhoids     Subjective:  CC:   Chief Complaint  Patient presents with  . Hypertension    HPI:   Ronald Cordova is a 49 y.o. male who presents for  Multiple issues including hypertension , overweight, and new onset productive cough  ELEVATED BP  .  READING [facility] 170/100  WT GAIN .  Has not started exercise program  FOLLICULITIS slow to resolve,  On last day of doxycycline   PURULENT SPUTUM  For the last several days    Past Medical History  Diagnosis Date  . Treadmill stress test negative for angina pectoris 2003  . Anxiety state, unspecified   . GERD (gastroesophageal reflux disease)     carafate added by Dr. Loreta Ave  . Polyp of colon, hyperplastic 01/08/2011    [name], repeat due 2018  . Eosinophilic esophagitis 2010    by EGD with  biopsies  . Irritable bowel syndrome   . Hemorrhoids     Past Surgical History  Procedure Laterality Date  . Nasal sinus surgery  1998    bilateral  . Knee arthroscopy  2000    left  . Joint replacement    . Carpal tunnel release  Sept 2008    R hand, by  [place], [place]   . Appendectomy      done during high school       The following  portions of the patient's history were reviewed and updated as appropriate: Allergies, current medications, and problem list.    Review of Systems:   Patient denies headache, fevers, malaise, unintentional weight loss, skin rash, eye pain, sinus congestion and sinus pain, sore throat, dysphagia,  hemoptysis , cough, dyspnea, wheezing, chest pain, palpitations, orthopnea, edema, abdominal pain, nausea, melena, diarrhea, constipation, flank pain, dysuria, hematuria, urinary  Frequency, nocturia, numbness, tingling, seizures,  Focal weakness, Loss of consciousness,  Tremor, insomnia, depression, anxiety, and suicidal ideation.     History   Social History  . Marital Status: Single    Spouse Name: N/A  . Number of Children: N/A  . Years of Education: N/A   Occupational History  . Not on file.   Social History Main Topics  . Smoking status: Former Smoker -- 1.00 packs/day for 15 years    Types: Cigarettes    Quit date: 05/17/2011  . Smokeless tobacco: Never Used  . Alcohol Use: Yes     Comment: occasional  . Drug Use: No  . Sexual Activity: Not on file   Other Topics Concern  . Not on file   Social History Narrative    Objective:  Filed Vitals:   11/11/14 1743  BP: 120/90  Pulse: 91  Temp:  97.7 F (36.5 C)  Resp: 14     General appearance: alert, cooperative and appears stated age Ears: normal TM's and external ear canals both ears Throat: lips, mucosa, and tongue normal; teeth and gums normal Neck: no adenopathy, no carotid bruit, supple, symmetrical, trachea midline and thyroid not enlarged, symmetric, no tenderness/mass/nodules Back: symmetric, no curvature. ROM normal. No CVA tenderness. Lungs: clear to auscultation bilaterally Heart: regular rate and rhythm, S1, S2 normal, no murmur, click, rub or gallop Abdomen: soft, non-tender; bowel sounds normal; no masses,  no organomegaly Pulses: 2+ and symmetric Skin: Skin color, texture, turgor normal. No rashes or  lesions Lymph nodes: Cervical, supraclavicular, and axillary nodes normal.  Assessment and Plan:  Overweight (BMI 25.0-29.9) I have addressed  BMI and recommended wt loss of 10% of body weigh over the next 6 months using a low glycemic index diet and regular exercise a minimum of 5 days per week.     Essential hypertension New onset,  Adding amlodipine 5 mg daily  Lab Results  Component Value Date   CREATININE 1.10 10/28/2014  \ Lab Results  Component Value Date   NA 141 10/28/2014   K 4.0 10/28/2014   CL 105 10/28/2014   CO2 28 10/28/2014      Folliculitis Resolving with doxycyline.     Updated Medication List Outpatient Encounter Prescriptions as of 11/11/2014  Medication Sig  . aspirin 81 MG tablet Take 81 mg by mouth daily.  Marland Kitchen. buPROPion (WELLBUTRIN SR) 150 MG 12 hr tablet Take 1 tablet (150 mg total) by mouth 2 (two) times daily.  . clonazePAM (KLONOPIN) 1 MG tablet Take 0.5 tablets (0.5 mg total) by mouth 2 (two) times daily as needed.  . CRESTOR 5 MG tablet TAKE 1 TABLET BY MOUTH EVERY DAY.  . cyclobenzaprine (FLEXERIL) 5 MG tablet Take 1 tablet (5 mg total) by mouth 3 (three) times daily as needed for muscle spasms.  . DEXILANT 60 MG capsule Take 1 capsule by mouth daily.  Marland Kitchen. doxycycline (VIBRAMYCIN) 100 MG capsule Take 1 capsule (100 mg total) by mouth 2 (two) times daily.  . fluticasone (VERAMYST) 27.5 MCG/SPRAY nasal spray Place 2 sprays into the nose daily.  . hydrochlorothiazide (HYDRODIURIL) 12.5 MG tablet Take 1 tablet (12.5 mg total) by mouth daily.  Marland Kitchen. nystatin (MYCOSTATIN/NYSTOP) 100000 UNIT/GM POWD APPLY TOPICALLY 2 TIMES DAILY.  Marland Kitchen. nystatin cream (MYCOSTATIN) Apply 1 application topically 2 (two) times daily.  . Probiotic Product (ALIGN PO) Take 1 tablet by mouth daily.   . tadalafil (CIALIS) 20 MG tablet Take 0.5-1 tablets (10-20 mg total) by mouth every other day as needed for erectile dysfunction.  Marland Kitchen. amLODipine (NORVASC) 5 MG tablet Take 1 tablet (5  mg total) by mouth daily.   A total of 25 minutes of face to face time was spent with patient more than half of which was spent in counselling and coordination of care    No orders of the defined types were placed in this encounter.    No Follow-up on file.

## 2014-11-11 NOTE — Telephone Encounter (Signed)
[facility] TELEPHONE ADVICE RECORD [facility]  Patient Name: Ronald Cordova  DOB: [birth year]    Initial Comment Caller states just put on bp meds, bp still high, doesn't seem meds are helping. Pt is driving and stated that he might be in a low signal area, said to call him right back if no answer   Nurse Assessment  Nurse: Laural BenesJohnson, RN, Dondra SpryGail Date/Time Lamount Cohen(Eastern Time): 11/11/2014 9:06:15 AM  Confirm and document reason for call. If symptomatic, describe symptoms. 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  Has the patient traveled out of the country within the last 30 days? ---No  Does the patient require triage? ---Yes  Related visit to physician within the last 2 weeks? ---No  Does the PT have any chronic conditions? (i.e. diabetes, asthma, etc.) ---Yes  List chronic conditions. ---HTN     Guidelines    Guideline Title Affirmed Question Affirmed Notes  High Blood Pressure BP ? 160/100    Final Disposition User   See Physician within 24 Hours [place], Dondra SpryGail    Comments  appt time 545 pm 11/11/2014 with Dr. Darrick Huntsmanullo

## 2014-11-11 NOTE — Telephone Encounter (Signed)
FYI: Patient will be seen by Dr. Darrick Huntsmanullo today at 5: 45pm.

## 2014-11-13 ENCOUNTER — Encounter: Payer: Self-pay | Admitting: Internal Medicine

## 2014-11-13 DIAGNOSIS — I10 Essential (primary) hypertension: Secondary | ICD-10-CM | POA: Insufficient documentation

## 2014-11-13 NOTE — Assessment & Plan Note (Signed)
Resolving with doxycyline.

## 2014-11-13 NOTE — Assessment & Plan Note (Signed)
I have addressed  BMI and recommended wt loss of 10% of body weigh over the next 6 months using a low glycemic index diet and regular exercise a minimum of 5 days per week.   

## 2014-11-13 NOTE — Assessment & Plan Note (Signed)
New onset,  Adding amlodipine 5 mg daily  Lab Results  Component Value Date   CREATININE 1.10 10/28/2014  \ Lab Results  Component Value Date   NA 141 10/28/2014   K 4.0 10/28/2014   CL 105 10/28/2014   CO2 28 10/28/2014

## 2014-11-25 ENCOUNTER — Other Ambulatory Visit: Payer: Self-pay

## 2014-11-25 MED ORDER — HYDROCHLOROTHIAZIDE 12.5 MG PO TABS: 12.5000 mg | ORAL_TABLET | Freq: Every day | ORAL | Status: DC

## 2014-11-25 NOTE — Telephone Encounter (Signed)
The patient is hoping to get an rx for a 90 day supply written for his blood pressure medication.

## 2014-11-25 NOTE — Telephone Encounter (Signed)
Spoke to pt, confirmed [facility] Rx sent to pharmacy by escript

## 2014-12-02 ENCOUNTER — Ambulatory Visit (INDEPENDENT_AMBULATORY_CARE_PROVIDER_SITE_OTHER): Payer: BLUE CROSS/BLUE SHIELD | Admitting: Internal Medicine

## 2014-12-02 ENCOUNTER — Encounter: Payer: Self-pay | Admitting: Internal Medicine

## 2014-12-02 VITALS — BP 118/78 | HR 82 | Temp 98.2°F | Resp 16 | Ht 70.0 in | Wt 204.0 lb

## 2014-12-02 DIAGNOSIS — E785 Hyperlipidemia, unspecified: Secondary | ICD-10-CM | POA: Diagnosis not present

## 2014-12-02 DIAGNOSIS — I10 Essential (primary) hypertension: Secondary | ICD-10-CM

## 2014-12-02 DIAGNOSIS — F331 Major depressive disorder, recurrent, moderate: Secondary | ICD-10-CM

## 2014-12-02 DIAGNOSIS — F411 Generalized anxiety disorder: Secondary | ICD-10-CM | POA: Diagnosis not present

## 2014-12-02 MED ORDER — AZELASTINE HCL 0.15 % NA SOLN: 1.0000 | Freq: Two times a day (BID) | NASAL | Status: DC

## 2014-12-02 NOTE — Patient Instructions (Signed)
Supraventricular Tachycardia °Supraventricular tachycardia (SVT) is an abnormal heart rhythm (arrhythmia) that causes the heart to beat very fast (tachycardia). This kind of fast heartbeat originates in the upper chambers of the heart (atria). SVT can cause the heart to beat greater than 100 beats per minute. SVT can have a rapid burst of heartbeats. This can start and stop suddenly without warning and is called nonsustained. SVT can also be sustained, in which the heart beats at a continuous fast rate.  °CAUSES  °There can be different causes of SVT. Some of these include: °· Heart valve problems such as mitral valve prolapse. °· An enlarged heart (hypertrophic cardiomyopathy). °· Congenital heart problems. °· Heart inflammation (pericarditis). °· Hyperthyroidism. °· Low potassium or magnesium levels. °· Caffeine. °· Drug use such as cocaine, methamphetamines, or stimulants. °· Some over-the-counter medicines such as: °¨ Decongestants. °¨ Diet medicines. °¨ Herbal medicines. °SYMPTOMS  °Symptoms of SVT can vary. Symptoms depend on whether the SVT is sustained or nonsustained. You may experience: °· No symptoms (asymptomatic). °· An awareness of your heart beating rapidly (palpitations). °· Shortness of breath. °· Chest pain or pressure. °If your blood pressure drops because of the SVT, you may experience: °· Fainting or near fainting. °· Weakness. °· Dizziness. °DIAGNOSIS  °Different tests can be performed to diagnose SVT, such as: °· An electrocardiogram (EKG). This is a painless test that records the electrical activity of your heart. °· Holter monitor. This is a 24 hour recording of your heart rhythm. You will be given a diary. Write down all symptoms that you have and what you were doing at the time you experienced symptoms. °· Arrhythmia monitor. This is a small device that your wear for several weeks. It records the heart rhythm when you have symptoms. °· Echocardiogram. This is an imaging test to help detect  abnormal heart structure such as congenital abnormalities, heart valve problems, or heart enlargement. °· Stress test. This test can help determine if the SVT is related to exercise. °· Electrophysiology study (EPS). This is a procedure that evaluates your heart's electrical system and can help your caregiver find the cause of your SVT. °TREATMENT  °Treatment of SVT depends on the symptoms, how often it recurs, and whether there are any underlying heart problems.  °· If symptoms are rare and no other cardiac disease is present, no treatment may be needed. °· Blood work may be done to check potassium, magnesium, and thyroid hormone levels to see if they are abnormal. If these levels are abnormal, treatment to correct the problems will occur. °Medicines °Your caregiver may use oral medicines to treat SVT. These medicines are given for long-term control of SVT. Medicines may be used alone or in combination with other treatments. These medicines work to slow nerve impulses in the heart muscle. These medicines can also be used to treat high blood pressure. Some of these medicines may include: °· Calcium channel blockers. °· Beta blockers. °· Digoxin. °Nonsurgical procedures °Nonsurgical techniques may be used if oral medicines do not work. Some examples include: °· Cardioversion. This technique uses either drugs or an electrical shock to restore a normal heart rhythm. °¨ Cardioversion drugs may be given through an intravenous (IV) line to help "reset" the heart rhythm. °¨ In electrical cardioversion, the caregiver shocks your heart to stop its beat for a split second. This helps to reset the heart to a normal rhythm. °· Ablation. This procedure is done under mild sedation. High frequency radio wave energy is used to   destroy the area of heart tissue responsible for the SVT. °HOME CARE INSTRUCTIONS  °· Do not smoke. °· Only take medicines prescribed by your caregiver. Check with your caregiver before using over-the-counter  medicines. °· Check with your caregiver about how much alcohol and caffeine (coffee, tea, colas, or chocolate) you may have. °· It is very important to keep all follow-up referrals and appointments in order to properly manage this problem. °SEEK IMMEDIATE MEDICAL CARE IF: °· You have dizziness. °· You faint or nearly faint. °· You have shortness of breath. °· You have chest pain or pressure. °· You have sudden nausea or vomiting. °· You have profuse sweating. °· You are concerned about how long your symptoms last. °· You are concerned about the frequency of your SVT episodes. °If you have the above symptoms, call your local emergency services (911 in [place] immediately. Do not drive yourself to the hospital. °MAKE SURE YOU:  °· Understand these instructions. °· Will watch your condition. °· Will get help right away if you are not doing well or get worse. °Document Released: 08/02/2005 Document Revised: 10/25/2011 Document Reviewed: 11/14/2008 °ExitCare® Patient Information ©2015 [employer]. This information is not intended to replace advice given to you by your health care provider. Make sure you discuss any questions you have with your health care provider. ° °

## 2014-12-02 NOTE — Progress Notes (Signed)
Patient ID: Ronald Cordova, male   DOB: 10-02-1965, 49 y.o.   MRN: [id number]  Patient Active Problem List   Diagnosis Date Noted  . Essential hypertension 11/13/2014  . Major depressive disorder, recurrent episode 11/03/2014  . Degeneration of lumbar or lumbosacral intervertebral disc 01/14/2014  . Folliculitis 01/14/2014  . Diaper candidiasis 01/14/2014  . Stasis edema of left lower extremity 05/14/2013  . Overweight (BMI 25.0-29.9) 08/28/2012  . Incidental pulmonary nodule, > 3mm and < 8mm 02/21/2012  . 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 Isolated or specific phobia 01/21/2012  . Chest pain on respiration 01/21/2012  . Hyperlipidemia LDL goal < 130 05/09/2011  . Treadmill stress test negative for angina pectoris   . Anxiety state   . Irritable bowel syndrome   . Hemorrhoids     Subjective:  CC:   Chief Complaint  Patient presents with  . Follow-up  . Depression    HPI:   Ronald Cordova is a 49 y.o. male who presents for  Follow up on depressive symptoms aggravated by dissolution of romantic relationship.  He reports improved symptoms and improvement in his blood pressure has improved as well.  He feels more sociable,  Less episodesk  of anhedonia and insomnia  Had recent episode of allergic rhinitis ,  Now resolved,    Past Medical History  Diagnosis Date  . Treadmill stress test negative for angina pectoris 2003  . Anxiety state, unspecified   . GERD (gastroesophageal reflux disease)     carafate added by Dr. Loreta AveMann  . Polyp of colon, hyperplastic 01/08/2011    Charna ElizabethJyothi Mann, repeat due 2018  . Eosinophilic esophagitis 2010    by EGD with  biopsies  . Irritable bowel syndrome   . Hemorrhoids     Past Surgical History  Procedure Laterality Date  . Nasal sinus surgery  1998    bilateral  . Knee arthroscopy  2000    left  . Joint replacement    . Carpal tunnel release  Sept 2008    R hand, by  [place], [place]   . Appendectomy      done during high  school       The following portions of the patient's history were reviewed and updated as appropriate: Allergies, current medications, and problem list.    Review of Systems:   Patient denies headache, fevers, malaise, unintentional weight loss, skin rash, eye pain, sinus congestion and sinus pain, sore throat, dysphagia,  hemoptysis , cough, dyspnea, wheezing, chest pain, palpitations, orthopnea, edema, abdominal pain, nausea, melena, diarrhea, constipation, flank pain, dysuria, hematuria, urinary  Frequency, nocturia, numbness, tingling, seizures,  Focal weakness, Loss of consciousness,  Tremor, insomnia, depression, anxiety, and suicidal ideation.     History   Social History  . Marital Status: Single    Spouse Name: N/A  . Number of Children: N/A  . Years of Education: N/A   Occupational History  . Not on file.   Social History Main Topics  . Smoking status: Former Smoker -- 1.00 packs/day for 15 years    Types: Cigarettes    Quit date: 05/17/2011  . Smokeless tobacco: Never Used  . Alcohol Use: Yes     Comment: occasional  . Drug Use: No  . Sexual Activity: Not on file   Other Topics Concern  . Not on file   Social History Narrative    Objective:  Filed Vitals:   12/02/14 1754  BP: 118/78  Pulse: 82  Temp:  98.2 F (36.8 C)  Resp: 16     General appearance: alert, cooperative and appears stated age Ears: normal TM's and external ear canals both ears Throat: lips, mucosa, and tongue normal; teeth and gums normal Neck: no adenopathy, no carotid bruit, supple, symmetrical, trachea midline and thyroid not enlarged, symmetric, no tenderness/mass/nodules Back: symmetric, no curvature. ROM normal. No CVA tenderness. Lungs: clear to auscultation bilaterally Heart: regular rate and rhythm, S1, S2 normal, no murmur, click, rub or gallop Abdomen: soft, non-tender; bowel sounds normal; no masses,  no organomegaly Pulses: 2+ and symmetric Skin: Skin color,  texture, turgor normal. No rashes or lesions Lymph nodes: Cervical, supraclavicular, and axillary nodes normal.  Assessment and Plan:  Anxiety state Complicated by depressive symptoms,  Now improved,  No changes today   Major depressive disorder, recurrent episode Precipitated by dissolution of romantic relationship,  Improved outlook and symptoms,  No changes today   Essential hypertension Well controlled on current regimen., no changes today.   A total of 40 minutes was spent with patient more than half of which was spent in counseling patient on the above mentioned issues , reviewing and explaining recent labs and imaging studies done, and coordination of care. Updated Medication List Outpatient Encounter Prescriptions as of 12/02/2014  Medication Sig  . amLODipine (NORVASC) 5 MG tablet Take 1 tablet (5 mg total) by mouth daily.  Marland Kitchen aspirin 81 MG tablet Take 81 mg by mouth daily.  . clonazePAM (KLONOPIN) 1 MG tablet Take 0.5 tablets (0.5 mg total) by mouth 2 (two) times daily as needed.  . CRESTOR 5 MG tablet TAKE 1 TABLET BY MOUTH EVERY DAY.  . cyclobenzaprine (FLEXERIL) 5 MG tablet Take 1 tablet (5 mg total) by mouth 3 (three) times daily as needed for muscle spasms.  . DEXILANT 60 MG capsule Take 1 capsule by mouth daily.  . fluticasone (VERAMYST) 27.5 MCG/SPRAY nasal spray Place 2 sprays into the nose daily.  . hydrochlorothiazide (HYDRODIURIL) 12.5 MG tablet Take 1 tablet (12.5 mg total) by mouth daily.  . montelukast (SINGULAIR) 10 MG tablet Take 10 mg by mouth at bedtime.  Marland Kitchen nystatin (MYCOSTATIN/NYSTOP) 100000 UNIT/GM POWD APPLY TOPICALLY 2 TIMES DAILY.  Marland Kitchen nystatin cream (MYCOSTATIN) Apply 1 application topically 2 (two) times daily.  . Probiotic Product (ALIGN PO) Take 1 tablet by mouth daily.   . Azelastine HCl 0.15 % SOLN Place 1 spray into the nose 2 (two) times daily.  . tadalafil (CIALIS) 20 MG tablet Take 0.5-1 tablets (10-20 mg total) by mouth every other day as  needed for erectile dysfunction. (Patient not taking: Reported on 12/02/2014)  . [DISCONTINUED] buPROPion (WELLBUTRIN SR) 150 MG 12 hr tablet Take 1 tablet (150 mg total) by mouth 2 (two) times daily. (Patient not taking: Reported on 12/02/2014)  . [DISCONTINUED] doxycycline (VIBRAMYCIN) 100 MG capsule Take 1 capsule (100 mg total) by mouth 2 (two) times daily. (Patient not taking: Reported on 12/02/2014)     Orders Placed This Encounter  Procedures  . Lipid panel  . Basic metabolic panel    No Follow-up on file.

## 2014-12-02 NOTE — Progress Notes (Signed)
Pre-visit discussion using our clinic review tool. No additional management support is needed unless otherwise documented below in the visit note.  

## 2014-12-03 NOTE — Assessment & Plan Note (Signed)
Precipitated by dissolution of romantic relationship,  Improved outlook and symptoms,  No changes today

## 2014-12-03 NOTE — Assessment & Plan Note (Signed)
Well controlled on current regimen, no changes today. 

## 2014-12-03 NOTE — Assessment & Plan Note (Signed)
Complicated by depressive symptoms,  Now improved,  No changes today

## 2014-12-06 ENCOUNTER — Other Ambulatory Visit (INDEPENDENT_AMBULATORY_CARE_PROVIDER_SITE_OTHER): Payer: BLUE CROSS/BLUE SHIELD

## 2014-12-06 ENCOUNTER — Other Ambulatory Visit: Payer: BLUE CROSS/BLUE SHIELD

## 2014-12-06 DIAGNOSIS — I10 Essential (primary) hypertension: Secondary | ICD-10-CM | POA: Diagnosis not present

## 2014-12-06 DIAGNOSIS — E785 Hyperlipidemia, unspecified: Secondary | ICD-10-CM

## 2014-12-06 LAB — BASIC METABOLIC PANEL
BUN: 13 mg/dL (ref 6–23)
CO2: 31 meq/L (ref 19–32)
CREATININE: 0.97 mg/dL (ref 0.40–1.50)
Calcium: 9.6 mg/dL (ref 8.4–10.5)
Chloride: 104 mEq/L (ref 96–112)
GFR: 87.73 mL/min (ref >60.00)
Glucose, Bld: 85 mg/dL (ref 70–99)
Potassium: 3.8 mEq/L (ref 3.5–5.1)
Sodium: 140 mEq/L (ref 135–145)

## 2014-12-06 LAB — LIPID PANEL
CHOL/HDL RATIO: 4
CHOLESTEROL: 141 mg/dL (ref 0–200)
HDL: 32.6 mg/dL — AB (ref >39.00)
LDL CALC: 90 mg/dL (ref 0–99)
NonHDL: 108.4
TRIGLYCERIDES: 92 mg/dL (ref 0.0–149.0)
VLDL: 18.4 mg/dL (ref 0.0–40.0)

## 2014-12-08 ENCOUNTER — Encounter: Payer: Self-pay | Admitting: Internal Medicine

## 2014-12-25 ENCOUNTER — Telehealth: Payer: Self-pay | Admitting: *Deleted

## 2014-12-25 ENCOUNTER — Other Ambulatory Visit: Payer: Self-pay | Admitting: *Deleted

## 2014-12-25 DIAGNOSIS — F330 Major depressive disorder, recurrent, mild: Secondary | ICD-10-CM

## 2014-12-25 MED ORDER — BUPROPION HCL ER (SR) 150 MG PO TB12: 150.0000 mg | ORAL_TABLET | Freq: Two times a day (BID) | ORAL | Status: DC

## 2014-12-25 NOTE — Telephone Encounter (Signed)
Fax from pharmacy requesting Bupropion HCL Sr 150 mg.  Medication not on pts current med list.  Please advise refill

## 2014-12-25 NOTE — Telephone Encounter (Signed)
When a medication runs out of refills and lapses by even one day ,  I think meds can usually be found in "History" .  that's where I found this one.  Refilled and sent. PLEASE CALL patient, however to let him know we refilled it

## 2014-12-25 NOTE — Telephone Encounter (Signed)
Called pt to advised Rx sent, he states he is no longer taking this medication and it must have been on automatic refill.  Pt requested I discontinue med and notify pharmacy.  Complete.

## 2014-12-25 NOTE — Assessment & Plan Note (Signed)
Did not tolerate wellbutrin.  Medication was stopped,  Symptoms have improved with counselling

## 2014-12-25 NOTE — Telephone Encounter (Signed)
Thank you Nisha!

## 2014-12-27 NOTE — Telephone Encounter (Signed)
Letter mailed to patient.

## 2015-03-17 ENCOUNTER — Telehealth: Payer: Self-pay

## 2015-03-17 MED ORDER — CLONAZEPAM 1 MG PO TABS: 0.5000 mg | ORAL_TABLET | Freq: Two times a day (BID) | ORAL | Status: DC | PRN

## 2015-03-17 NOTE — Telephone Encounter (Signed)
WHAT MEDICATION>?? 

## 2015-03-17 NOTE — Telephone Encounter (Signed)
Clonazepam 1 mg tablet

## 2015-03-17 NOTE — Telephone Encounter (Signed)
Ok to refill,  printed rx  

## 2015-03-17 NOTE — Telephone Encounter (Signed)
Last refilled 11/04/14 for #60 with 3 refills. Last office visit 12/02/14. Okay to refill?

## 2015-03-21 ENCOUNTER — Other Ambulatory Visit: Payer: Self-pay | Admitting: Internal Medicine

## 2015-05-20 ENCOUNTER — Other Ambulatory Visit: Payer: Self-pay | Admitting: Internal Medicine

## 2015-05-27 ENCOUNTER — Encounter: Payer: Self-pay | Admitting: Nurse Practitioner

## 2015-05-27 ENCOUNTER — Ambulatory Visit (INDEPENDENT_AMBULATORY_CARE_PROVIDER_SITE_OTHER): Payer: BLUE CROSS/BLUE SHIELD | Admitting: Nurse Practitioner

## 2015-05-27 VITALS — BP 138/72 | HR 105 | Temp 97.7°F | Resp 14 | Ht 70.0 in | Wt 208.8 lb

## 2015-05-27 DIAGNOSIS — R350 Frequency of micturition: Secondary | ICD-10-CM

## 2015-05-27 LAB — POCT URINALYSIS DIPSTICK
Bilirubin, UA: NEGATIVE
Blood, UA: NEGATIVE
Glucose, UA: 100
Ketones, UA: NEGATIVE
Leukocytes, UA: NEGATIVE
Nitrite, UA: POSITIVE
SPEC GRAV UA: 1.025
UROBILINOGEN UA: 1
pH, UA: 5

## 2015-05-27 MED ORDER — CEPHALEXIN 500 MG PO CAPS: 500.0000 mg | ORAL_CAPSULE | Freq: Two times a day (BID) | ORAL | Status: DC

## 2015-05-27 NOTE — Patient Instructions (Signed)
Please take Keflex twice daily for 7 days. You can take with the Minocycline.   Please take a probiotic ( Align, Floraque or Culturelle) while you are on the antibiotic to prevent a serious antibiotic associated diarrhea  Called clostirudium dificile colitis.

## 2015-05-27 NOTE — Progress Notes (Signed)
Patient ID: Ronald Cordova, male    DOB: 11-Jun-1966  Age: 49 y.o. MRN: [id number]  CC: Urinary Frequency   HPI GEOVANNI Cordova presents for CC of Urinary Frequency x 3 weeks.   1) Azo yesterday, pain and pressure below the waist- spasms, pain to the tip of the penis. 3 weeks ago couldn't go to usual rest stop, had to hold it for 1 hour. Went away then came back over the last 3 days. Denies odor or penile discharge Patient feels the pressure is also on the perineum  History Rivers has a past medical history of Treadmill stress test negative for angina pectoris (2003); Anxiety state, unspecified; GERD (gastroesophageal reflux disease); Polyp of colon, hyperplastic (01/08/2011); Eosinophilic esophagitis (4734); Irritable bowel syndrome; and Hemorrhoids.   He has past surgical history that includes Nasal sinus surgery (1998); Knee arthroscopy (2000); Joint replacement; carpal tunnel release (Sept 2008); and Appendectomy.   His family history includes BRCA 1/2 in his maternal aunt; Hyperlipidemia in his brother and sister.He reports that he quit smoking about 4 years ago. His smoking use included Cigarettes. He has a 15 pack-year smoking history. He has never used smokeless tobacco. He reports that he drinks alcohol. He reports that he does not use illicit drugs.  Outpatient Prescriptions Prior to Visit  Medication Sig Dispense Refill  . amLODipine (NORVASC) 5 MG tablet Take 1 tablet (5 mg total) by mouth daily. 90 tablet 3  . aspirin 81 MG tablet Take 81 mg by mouth daily.    . Azelastine HCl 0.15 % SOLN Place 1 spray into the nose 2 (two) times daily. 30 mL 2  . clonazePAM (KLONOPIN) 1 MG tablet Take 0.5 tablets (0.5 mg total) by mouth 2 (two) times daily as needed. 60 tablet 3  . CRESTOR 5 MG tablet TAKE 1 TABLET BY MOUTH EVERY DAY 90 tablet 1  . cyclobenzaprine (FLEXERIL) 5 MG tablet Take 1 tablet (5 mg total) by mouth 3 (three) times daily as needed for muscle spasms. 90 tablet 1  . DEXILANT  60 MG capsule Take 1 capsule by mouth daily.    . fluticasone (VERAMYST) 27.5 MCG/SPRAY nasal spray Place 2 sprays into the nose daily. 30 g 3  . hydrochlorothiazide (HYDRODIURIL) 12.5 MG tablet TAKE 1 TABLET (12.5 MG TOTAL) BY MOUTH DAILY. 90 tablet 1  . montelukast (SINGULAIR) 10 MG tablet Take 10 mg by mouth at bedtime.    Marland Kitchen nystatin (MYCOSTATIN/NYSTOP) 100000 UNIT/GM POWD APPLY TOPICALLY 2 TIMES DAILY. 15 g 3  . nystatin cream (MYCOSTATIN) Apply 1 application topically 2 (two) times daily. 30 g 1  . Probiotic Product (ALIGN PO) Take 1 tablet by mouth daily.     . tadalafil (CIALIS) 20 MG tablet Take 0.5-1 tablets (10-20 mg total) by mouth every other day as needed for erectile dysfunction. (Patient not taking: Reported on 05/27/2015) 5 tablet 11   No facility-administered medications prior to visit.    ROS Review of Systems  Constitutional: Negative for fever, chills, diaphoresis and fatigue.  Eyes: Negative for visual disturbance.  Respiratory: Negative for chest tightness, shortness of breath and wheezing.   Cardiovascular: Negative for chest pain, palpitations and leg swelling.  Gastrointestinal: Positive for abdominal pain. Negative for nausea, vomiting and diarrhea.  Genitourinary: Positive for frequency and penile pain. Negative for dysuria, urgency, hematuria, decreased urine volume, discharge, penile swelling, scrotal swelling, difficulty urinating and testicular pain.  Neurological: Negative for dizziness, weakness and numbness.  Psychiatric/Behavioral: The patient is not nervous/anxious.  Objective:  BP 138/72 mmHg  Pulse 105  Temp(Src) 97.7 F (36.5 C) (Oral)  Resp 14  Ht '5\' 10"'  (1.778 m)  Wt 208 lb 12.8 oz (94.711 kg)  BMI 29.96 kg/m2  SpO2 97%  Physical Exam  Constitutional: He is oriented to person, place, and time. He appears well-developed and well-nourished. No distress.  HENT:  Head: Normocephalic and atraumatic.  Right Ear: External ear normal.  Left  Ear: External ear normal.  Genitourinary:  Deferred today due to urinalysis  Neurological: He is alert and oriented to person, place, and time.  Skin: Skin is warm and dry. No rash noted. He is not diaphoretic.  Psychiatric: He has a normal mood and affect. His behavior is normal. Judgment and thought content normal.      Assessment & Plan:   Saul was seen today for urinary frequency.  Diagnoses and all orders for this visit:  Urinary frequency -     POCT Urinalysis Dipstick -     Urine Culture  Other orders -     cephALEXin (KEFLEX) 500 MG capsule; Take 1 capsule (500 mg total) by mouth 2 (two) times daily.  I am having Mr. Matlack start on cephALEXin. I am also having him maintain his Probiotic Product (ALIGN PO), aspirin, fluticasone, DEXILANT, nystatin cream, nystatin, tadalafil, cyclobenzaprine, amLODipine, montelukast, Azelastine HCl, clonazePAM, CRESTOR, hydrochlorothiazide, minocycline, and TAZORAC.  Meds ordered this encounter  Medications  . minocycline (MINOCIN,DYNACIN) 100 MG capsule    Sig: TAKE 1 CAP 2 TIMES A DAY WITH A FULL GLASS OF WATER & FOOD. DO NOT LIE DOWN 1 HOUR AFTER TAKING.    Refill:  0  . TAZORAC 0.05 % cream    Sig: APPLY A PEA SIZED AMOUNT AT BEDTIME    Refill:  3  . cephALEXin (KEFLEX) 500 MG capsule    Sig: Take 1 capsule (500 mg total) by mouth 2 (two) times daily.    Dispense:  14 capsule    Refill:  0    Order Specific Question:  Supervising Provider    Answer:  Crecencio Mc [[id number]]     Follow-up: Return if symptoms worsen or fail to improve.

## 2015-05-27 NOTE — Progress Notes (Signed)
Pre visit review using our clinic review tool, if applicable. No additional management support is needed unless otherwise documented below in the visit note. 

## 2015-05-29 LAB — URINE CULTURE
Colony Count: NO GROWTH
Organism ID, Bacteria: NO GROWTH

## 2015-05-30 DIAGNOSIS — R350 Frequency of micturition: Secondary | ICD-10-CM | POA: Insufficient documentation

## 2015-05-30 NOTE — Assessment & Plan Note (Signed)
POCT urine possible for UTI. Will obtain urine culture. Cannot rule out prostatitis at this point. Patient has reported prostatitis in the past. Will treat with Keflex 500 mg twice a day for 7 days. Follow-up after results via my chart

## 2015-07-28 ENCOUNTER — Ambulatory Visit (INDEPENDENT_AMBULATORY_CARE_PROVIDER_SITE_OTHER): Payer: BLUE CROSS/BLUE SHIELD | Admitting: Internal Medicine

## 2015-07-28 ENCOUNTER — Encounter: Payer: Self-pay | Admitting: Internal Medicine

## 2015-07-28 VITALS — BP 134/82 | HR 84 | Temp 98.3°F | Ht 70.0 in | Wt 218.5 lb

## 2015-07-28 DIAGNOSIS — M546 Pain in thoracic spine: Secondary | ICD-10-CM | POA: Diagnosis not present

## 2015-07-28 DIAGNOSIS — G8929 Other chronic pain: Secondary | ICD-10-CM | POA: Diagnosis not present

## 2015-07-28 DIAGNOSIS — R002 Palpitations: Secondary | ICD-10-CM | POA: Diagnosis not present

## 2015-07-28 DIAGNOSIS — Z1159 Encounter for screening for other viral diseases: Secondary | ICD-10-CM | POA: Diagnosis not present

## 2015-07-28 DIAGNOSIS — I10 Essential (primary) hypertension: Secondary | ICD-10-CM

## 2015-07-28 DIAGNOSIS — E663 Overweight: Secondary | ICD-10-CM

## 2015-07-28 DIAGNOSIS — F411 Generalized anxiety disorder: Secondary | ICD-10-CM

## 2015-07-28 DIAGNOSIS — E785 Hyperlipidemia, unspecified: Secondary | ICD-10-CM

## 2015-07-28 MED ORDER — CLONAZEPAM 0.5 MG PO TABS: 0.5000 mg | ORAL_TABLET | Freq: Two times a day (BID) | ORAL | Status: DC | PRN

## 2015-07-28 MED ORDER — PROPRANOLOL HCL 20 MG PO TABS: 20.0000 mg | ORAL_TABLET | Freq: Three times a day (TID) | ORAL | Status: DC

## 2015-07-28 MED ORDER — CELECOXIB 200 MG PO CAPS: 200.0000 mg | ORAL_CAPSULE | Freq: Two times a day (BID) | ORAL | Status: DC

## 2015-07-28 MED ORDER — TRAMADOL HCL 50 MG PO TABS: 50.0000 mg | ORAL_TABLET | Freq: Three times a day (TID) | ORAL | Status: DC | PRN

## 2015-07-28 MED ORDER — CYCLOBENZAPRINE HCL 10 MG PO TABS: 10.0000 mg | ORAL_TABLET | Freq: Every day | ORAL | Status: DC

## 2015-07-28 MED ORDER — LOSARTAN POTASSIUM-HCTZ 50-12.5 MG PO TABS: 1.0000 | ORAL_TABLET | Freq: Every day | ORAL | Status: DC

## 2015-07-28 NOTE — Progress Notes (Signed)
Subjective:  Patient ID: Ronald Cordova, male    DOB: 05-16-1966  Age: 49 y.o. MRN: [id number]  CC: The primary encounter diagnosis was Chronic bilateral thoracic back pain. Diagnoses of Need for hepatitis C screening test, Palpitations, Hyperlipidemia, Anxiety state, Essential hypertension, and Overweight (BMI 25.0-29.9) were also pertinent to this visit.  HPI [name] presents for follow up on multiple issues  1)  elevated blood pressure. Has been taking amlodipine and hctz.   Has noticed increased BP by end of day,  Legs swelling,  taking 800 mg ibuprofen daily for back pain,  No longer taking flexeril.  2) Weight gain of 10 lbs since October 11 , , personal best was 183 in Sept 2012.   Not exercising due to work scheudle of 14 hour days   3) Back pain persistent,  Hurts to get out of bed. Does not improve unless he takes 800 mg ibuprofen , repeats it 2-3 times daly  .  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  Brother has Celiac disease  and sister has RA  Having episodes of rapid heart beat lasting up to an hour in the evening when he lies down no chest pain or dyspnea,  Feels it is anxiety related     Outpatient Prescriptions Prior to Visit  Medication Sig Dispense Refill  . amLODipine (NORVASC) 5 MG tablet Take 1 tablet (5 mg total) by mouth daily. 90 tablet 3  . aspirin 81 MG tablet Take 81 mg by mouth daily.    . Azelastine HCl 0.15 % SOLN Place 1 spray into the nose 2 (two) times daily. 30 mL 2  . CRESTOR 5 MG tablet TAKE 1 TABLET BY MOUTH EVERY DAY 90 tablet 1  . DEXILANT 60 MG capsule Take 1 capsule by mouth daily.    . fluticasone (VERAMYST) 27.5 MCG/SPRAY nasal spray Place 2 sprays into the nose daily. 30 g 3  . hydrochlorothiazide (HYDRODIURIL) 12.5 MG tablet TAKE 1 TABLET (12.5 MG TOTAL) BY MOUTH DAILY. 90 tablet 1  . minocycline (MINOCIN,DYNACIN) 100 MG capsule TAKE 1 CAP 2 TIMES A DAY WITH A FULL GLASS OF WATER &  FOOD. DO NOT LIE DOWN 1 HOUR AFTER TAKING.  0  . montelukast (SINGULAIR) 10 MG tablet Take 10 mg by mouth at bedtime.    Marland Kitchen nystatin (MYCOSTATIN/NYSTOP) 100000 UNIT/GM POWD APPLY TOPICALLY 2 TIMES DAILY. 15 g 3  . tadalafil (CIALIS) 20 MG tablet Take 0.5-1 tablets (10-20 mg total) by mouth every other day as needed for erectile dysfunction. 5 tablet 11  . TAZORAC 0.05 % cream APPLY A PEA SIZED AMOUNT AT BEDTIME  3  . clonazePAM (KLONOPIN) 1 MG tablet Take 0.5 tablets (0.5 mg total) by mouth 2 (two) times daily as needed. 60 tablet 3  . nystatin cream (MYCOSTATIN) Apply 1 application topically 2 (two) times daily. 30 g 1  . Probiotic Product (ALIGN PO) Take 1 tablet by mouth daily.     . cephALEXin (KEFLEX) 500 MG capsule Take 1 capsule (500 mg total) by mouth 2 (two) times daily. (Patient not taking: Reported on 07/28/2015) 14 capsule 0  . cyclobenzaprine (FLEXERIL) 5 MG tablet Take 1 tablet (5 mg total) by mouth 3 (three) times daily as needed for muscle spasms. (Patient not taking: Reported on 07/28/2015) 90 tablet 1   No facility-administered medications prior to visit.    Review of Systems;  Patient denies headache, fevers, malaise, unintentional weight loss,  skin rash, eye pain, sinus congestion and sinus pain, sore throat, dysphagia,  hemoptysis , cough, dyspnea, wheezing, chest pain, palpitations, orthopnea, edema, abdominal pain, nausea, melena, diarrhea, constipation, flank pain, dysuria, hematuria, urinary  Frequency, nocturia, numbness, tingling, seizures,  Focal weakness, Loss of consciousness,  Tremor, insomnia, depression, anxiety, and suicidal ideation.      Objective:  BP 134/82 mmHg  Pulse 84  Temp(Src) 98.3 F (36.8 C) (Oral)  Ht  (1.778 m)  Wt 218 lb 8 oz (99.111 kg)  BMI 31.35 kg/m2  SpO2 96%  BP Readings from Last 3 Encounters:  07/28/15 134/82  05/27/15 138/72  12/02/14 118/78    Wt Readings from Last 3 Encounters:  07/28/15 218 lb 8 oz (99.111 kg)    05/27/15 208 lb 12.8 oz (94.711 kg)  12/02/14 204 lb (92.534 kg)    General appearance: alert, cooperative and appears stated age Ears: normal TM's and external ear canals both ears Throat: lips, mucosa, and tongue normal; teeth and gums normal Neck: no adenopathy, no carotid bruit, supple, symmetrical, trachea midline and thyroid not enlarged, symmetric, no tenderness/mass/nodules Back: symmetric, no curvature. ROM normal. No CVA tenderness. Lungs: clear to auscultation bilaterally Heart: regular rate and rhythm, S1, S2 normal, no murmur, click, rub or gallop Abdomen: soft, non-tender; bowel sounds normal; no masses,  no organomegaly Pulses: 2+ and symmetric Skin: Skin color, texture, turgor normal. No rashes or lesions Lymph nodes: Cervical, supraclavicular, and axillary nodes normal.  No results found for: HGBA1C  Lab Results  Component Value Date   CREATININE 0.97 12/06/2014   CREATININE 1.10 10/28/2014   CREATININE 1.6* 10/28/2014   CREATININE 1.1 10/28/2014    Lab Results  Component Value Date   WBC 9.6 10/28/2014   HGB 15.6 10/28/2014   HCT 47.7 10/28/2014   PLT 278 10/28/2014   GLUCOSE 85 12/06/2014   CHOL 141 12/06/2014   TRIG 92.0 12/06/2014   HDL 32.60* 12/06/2014   LDLDIRECT 102.3 04/17/2014   LDLCALC 90 12/06/2014   ALT 36 10/28/2014   AST 24 10/28/2014   NA 140 12/06/2014   K 3.8 12/06/2014   CL 104 12/06/2014   CREATININE 0.97 12/06/2014   BUN 13 12/06/2014   CO2 31 12/06/2014   TSH 0.869 04/17/2014    Dg Chest 2 View  10/28/2014  CLINICAL DATA:  Chest pain EXAM: CHEST  2 VIEW COMPARISON:  02/18/2012 FINDINGS: Normal heart size. Clear lungs. No pneumothorax or pleural effusion. IMPRESSION: No active cardiopulmonary disease. Electronically Signed   By: Jolaine Click M.D.   On: 10/28/2014 16:15    Assessment & Plan:   Problem List Items Addressed This Visit    Anxiety state    contineu clonazepam .      Overweight (BMI 25.0-29.9)    Reviewed  his previous success at weight loss,  His goal weight for BMI < 30 (204 lbs). I have addressed  BMI and recommended wt loss of 10% of body weigh over the next 6 months using a low glycemic index diet and regular exercise a minimum of 5 days per week.        Essential hypertension    Changing amlodipine to losartan Hct for improved control and resolution of edema.       Relevant Medications   losartan-hydrochlorothiazide (HYZAAR) 50-12.5 MG tablet   propranolol (INDERAL) 20 MG tablet   Chronic bilateral thoracic back pain - Primary    Presumed due to occupational hazards,  But given new Fh of  celiac and  RA, need to consider AS.        Relevant Orders   Sedimentation rate   C-reactive protein   Palpitations    Trial of propranolol.       Relevant Orders   Comprehensive metabolic panel   TSH   CBC with Differential/Platelet    Other Visit Diagnoses    Need for hepatitis C screening test        Relevant Orders    Hepatitis C antibody    Hyperlipidemia        Relevant Medications    losartan-hydrochlorothiazide (HYZAAR) 50-12.5 MG tablet    propranolol (INDERAL) 20 MG tablet    Other Relevant Orders    LDL cholesterol, direct       I have discontinued Mr. Elmer BalesKing's Probiotic Product (ALIGN PO), nystatin cream, cyclobenzaprine, and cephALEXin. I have also changed his clonazePAM. Additionally, I am having him start on losartan-hydrochlorothiazide, celecoxib, traMADol, cyclobenzaprine, and propranolol. Lastly, I am having him maintain his aspirin, fluticasone, DEXILANT, nystatin, tadalafil, amLODipine, montelukast, Azelastine HCl, CRESTOR, hydrochlorothiazide, minocycline, and TAZORAC.  Meds ordered this encounter  Medications  . losartan-hydrochlorothiazide (HYZAAR) 50-12.5 MG tablet    Sig: Take 1 tablet by mouth daily.    Dispense:  90 tablet    Refill:  3  . celecoxib (CELEBREX) 200 MG capsule    Sig: Take 1 capsule (200 mg total) by mouth 2 (two) times daily.    Dispense:   60 capsule    Refill:  3  . traMADol (ULTRAM) 50 MG tablet    Sig: Take 1 tablet (50 mg total) by mouth every 8 (eight) hours as needed.    Dispense:  90 tablet    Refill:  4  . cyclobenzaprine (FLEXERIL) 10 MG tablet    Sig: Take 1 tablet (10 mg total) by mouth at bedtime.    Dispense:  30 tablet    Refill:  4  . clonazePAM (KLONOPIN) 0.5 MG tablet    Sig: Take 1 tablet (0.5 mg total) by mouth 2 (two) times daily as needed.    Dispense:  30 tablet    Refill:  3    90 day supply  . propranolol (INDERAL) 20 MG tablet    Sig: Take 1 tablet (20 mg total) by mouth 3 (three) times daily.    Dispense:  90 tablet    Refill:  0    A total of 40 minutes was spent with patient more than half of which was spent in counseling patient on the above mentioned issues , reviewing and explaining recent labs and imaging studies done, and coordination of care.  Medications Discontinued During This Encounter  Medication Reason  . cephALEXin (KEFLEX) 500 MG capsule   . cyclobenzaprine (FLEXERIL) 5 MG tablet   . clonazePAM (KLONOPIN) 1 MG tablet Reorder  . Probiotic Product (ALIGN PO)   . nystatin cream (MYCOSTATIN)     Follow-up: No Follow-up on file.   Sherlene ShamsULLO, Timothy Townsel L, MD

## 2015-07-28 NOTE — Progress Notes (Signed)
Pre visit review using our clinic review tool, if applicable. No additional management support is needed unless otherwise documented below in the visit note. 

## 2015-07-28 NOTE — Patient Instructions (Addendum)
I am changing your bp medication :  Finish the amlodipine and double the HCTZ  untiil you are done with the amlodipine  Then start Losartan/hct and stop the extra hct   One pill daily going forward for BP  For your back pain:  Stop the ibuprofen,  Start celebrex once or twice daily   Adding tramadol up to 3 daily if needed for pain not controlled with celebrex  Can add flexeril  In the evening   The next time you have a rapid heart rate at night ,  Try taking the propranolol to slow your heart rate down.   I would like to check an HLA B27 , a blood test for ankylosing spondylitis

## 2015-07-29 ENCOUNTER — Other Ambulatory Visit: Payer: BLUE CROSS/BLUE SHIELD

## 2015-07-29 DIAGNOSIS — G8929 Other chronic pain: Secondary | ICD-10-CM | POA: Insufficient documentation

## 2015-07-29 DIAGNOSIS — M546 Pain in thoracic spine: Principal | ICD-10-CM

## 2015-07-29 DIAGNOSIS — R002 Palpitations: Secondary | ICD-10-CM | POA: Insufficient documentation

## 2015-07-29 NOTE — Assessment & Plan Note (Signed)
Changing amlodipine to losartan Hct for improved control and resolution of edema.

## 2015-07-29 NOTE — Assessment & Plan Note (Signed)
contineu clonazepam .

## 2015-07-29 NOTE — Assessment & Plan Note (Signed)
Reviewed his previous success at weight loss,  His goal weight for BMI < 30 (204 lbs). I have addressed  BMI and recommended wt loss of 10% of body weigh over the next 6 months using a low glycemic index diet and regular exercise a minimum of 5 days per week.

## 2015-07-29 NOTE — Assessment & Plan Note (Signed)
Presumed due to occupational hazards,  But given new Fh of celiac and  RA, need to consider AS.

## 2015-07-29 NOTE — Assessment & Plan Note (Signed)
Trial of propranolol.    

## 2015-07-30 ENCOUNTER — Other Ambulatory Visit: Payer: BLUE CROSS/BLUE SHIELD

## 2015-07-31 ENCOUNTER — Other Ambulatory Visit (INDEPENDENT_AMBULATORY_CARE_PROVIDER_SITE_OTHER): Payer: BLUE CROSS/BLUE SHIELD

## 2015-07-31 DIAGNOSIS — R001 Bradycardia, unspecified: Secondary | ICD-10-CM | POA: Diagnosis not present

## 2015-07-31 DIAGNOSIS — E785 Hyperlipidemia, unspecified: Secondary | ICD-10-CM | POA: Diagnosis not present

## 2015-08-01 ENCOUNTER — Encounter: Payer: Self-pay | Admitting: Internal Medicine

## 2015-08-01 LAB — SEDIMENTATION RATE: Sed Rate: 2 mm/hr (ref 0–22)

## 2015-08-01 LAB — CBC WITH DIFFERENTIAL/PLATELET
BASOS ABS: 0.1 10*3/uL (ref 0.0–0.1)
Basophils Relative: 1.4 % (ref 0.0–3.0)
EOS PCT: 4.7 % (ref 0.0–5.0)
Eosinophils Absolute: 0.4 10*3/uL (ref 0.0–0.7)
HEMATOCRIT: 50.4 % (ref 39.0–52.0)
Hemoglobin: 16.7 g/dL (ref 13.0–17.0)
LYMPHS PCT: 35.9 % (ref 12.0–46.0)
Lymphs Abs: 3.2 10*3/uL (ref 0.7–4.0)
MCHC: 33.2 g/dL (ref 30.0–36.0)
MCV: 90.3 fl (ref 78.0–100.0)
MONOS PCT: 8.9 % (ref 3.0–12.0)
Monocytes Absolute: 0.8 10*3/uL (ref 0.1–1.0)
NEUTROS ABS: 4.3 10*3/uL (ref 1.4–7.7)
Neutrophils Relative %: 49.1 % (ref 43.0–77.0)
PLATELETS: 312 10*3/uL (ref 150.0–400.0)
RBC: 5.59 Mil/uL (ref 4.22–5.81)
RDW: 13.9 % (ref 11.5–15.5)
WBC: 8.8 10*3/uL (ref 4.0–10.5)

## 2015-08-01 LAB — HEPATITIS C ANTIBODY: HCV Ab: NEGATIVE

## 2015-08-01 LAB — LDL CHOLESTEROL, DIRECT: LDL DIRECT: 146 mg/dL

## 2015-08-01 LAB — COMPREHENSIVE METABOLIC PANEL
ALBUMIN: 4.3 g/dL (ref 3.5–5.2)
ALT: 45 U/L (ref 0–53)
AST: 30 U/L (ref 0–37)
Alkaline Phosphatase: 71 U/L (ref 39–117)
BUN: 10 mg/dL (ref 6–23)
CALCIUM: 9.9 mg/dL (ref 8.4–10.5)
CHLORIDE: 100 meq/L (ref 96–112)
CO2: 29 mEq/L (ref 19–32)
Creatinine, Ser: 1.12 mg/dL (ref 0.40–1.50)
GFR: 74.12 mL/min (ref >60.00)
GLUCOSE: 101 mg/dL — AB (ref 70–99)
POTASSIUM: 3.9 meq/L (ref 3.5–5.1)
Sodium: 142 mEq/L (ref 135–145)
TOTAL PROTEIN: 7.1 g/dL (ref 6.0–8.3)
Total Bilirubin: 0.5 mg/dL (ref 0.2–1.2)

## 2015-08-01 LAB — C-REACTIVE PROTEIN: CRP: 0.1 mg/dL — ABNORMAL LOW (ref 0.5–20.0)

## 2015-08-01 LAB — TSH: TSH: 1 u[IU]/mL (ref 0.35–4.50)

## 2015-08-15 NOTE — Telephone Encounter (Signed)
Mailed unread message to patient.  

## 2015-09-01 ENCOUNTER — Telehealth: Payer: Self-pay | Admitting: *Deleted

## 2015-09-01 MED ORDER — CLONAZEPAM 0.5 MG PO TABS: 1.0000 mg | ORAL_TABLET | Freq: Two times a day (BID) | ORAL | Status: DC | PRN

## 2015-09-01 NOTE — Telephone Encounter (Signed)
Yes , he can increase the dose of clonazepam to 1 mg  Up to twice daily .  Does he need a refill now?

## 2015-09-01 NOTE — Telephone Encounter (Signed)
[facility]

## 2015-09-01 NOTE — Telephone Encounter (Signed)
Spoke with the patient, brother died Saturday and would like to increase his dose of his Klonopin.  Clarified his current dose, prescribed 0.5mg  twice a day.  Taking 1-2 pills during the day, but is having the most difficulty at night.  Currently wakes up about 2 hours after falling asleep in a panic since his brothers death.  Is wanting to take 2 pills at bedtime (1mg ).  Please advise.

## 2015-09-01 NOTE — Telephone Encounter (Signed)
Spoke with the patient and yes he needs a refill.  Please advise for changes.  [facility] [place]

## 2015-09-01 NOTE — Telephone Encounter (Signed)
Patient stated that he had a sudden death of his brother on Saturday. He wanted a consult on his medication, for Klonopen. He would like to take a upper dose for the time being.  Please advise  Contact [phone]

## 2015-09-02 NOTE — Telephone Encounter (Signed)
Script faxed as requested.

## 2015-09-22 ENCOUNTER — Other Ambulatory Visit: Payer: Self-pay | Admitting: Internal Medicine

## 2015-11-17 ENCOUNTER — Telehealth: Payer: Self-pay | Admitting: Internal Medicine

## 2015-11-17 NOTE — Telephone Encounter (Signed)
Pt would like to be seen by Dr. Darrick Huntsmanullo on April 17 around 4pm or even some time that week, he is off from work. Pt's brother passed away, heart attack. Pt would like to discuss medication. Could we get him in some time that week. Pt drives 18 wheeler and does not have voice mail set-up.

## 2015-11-17 NOTE — Telephone Encounter (Signed)
Use Monday April 17 at 4 PM

## 2015-11-21 ENCOUNTER — Other Ambulatory Visit: Payer: Self-pay | Admitting: Gastroenterology

## 2015-11-21 DIAGNOSIS — R1013 Epigastric pain: Secondary | ICD-10-CM

## 2015-12-01 ENCOUNTER — Encounter: Payer: Self-pay | Admitting: Internal Medicine

## 2015-12-01 ENCOUNTER — Ambulatory Visit (INDEPENDENT_AMBULATORY_CARE_PROVIDER_SITE_OTHER): Payer: BLUE CROSS/BLUE SHIELD | Admitting: Internal Medicine

## 2015-12-01 VITALS — BP 108/76 | HR 75 | Temp 97.9°F | Resp 12 | Ht 70.0 in | Wt 217.2 lb

## 2015-12-01 DIAGNOSIS — M064 Inflammatory polyarthropathy: Secondary | ICD-10-CM | POA: Diagnosis not present

## 2015-12-01 DIAGNOSIS — F411 Generalized anxiety disorder: Secondary | ICD-10-CM | POA: Diagnosis not present

## 2015-12-01 DIAGNOSIS — Z8489 Family history of other specified conditions: Secondary | ICD-10-CM | POA: Diagnosis not present

## 2015-12-01 DIAGNOSIS — I10 Essential (primary) hypertension: Secondary | ICD-10-CM

## 2015-12-01 DIAGNOSIS — E785 Hyperlipidemia, unspecified: Secondary | ICD-10-CM

## 2015-12-01 DIAGNOSIS — M545 Low back pain, unspecified: Secondary | ICD-10-CM

## 2015-12-01 DIAGNOSIS — M130 Polyarthritis, unspecified: Secondary | ICD-10-CM

## 2015-12-01 MED ORDER — CLONAZEPAM 1 MG PO TABS: 1.0000 mg | ORAL_TABLET | Freq: Three times a day (TID) | ORAL | Status: DC | PRN

## 2015-12-01 MED ORDER — NYSTATIN 100000 UNIT/GM EX OINT: 1.0000 "application " | TOPICAL_OINTMENT | Freq: Two times a day (BID) | CUTANEOUS | Status: DC

## 2015-12-01 NOTE — Progress Notes (Signed)
Subjective:  Patient ID: Ronald Cordova, male    DOB: 10-06-1965  Age: 50 y.o. MRN: [id number]  CC: The primary encounter diagnosis was Polyarthritis of multiple sites [facility]). Diagnoses of Midline low back pain without sciatica, Family history of sudden death in brother, Anxiety state, Hyperlipidemia with target LDL less than 130, Essential hypertension, and Polyarthritis were also pertinent to this visit.  HPI [name] presents for follow up   Had 2 episodes of severe RUQ pain radiated to chest.  Dr Collene Mares has ordered an U/s and EGD./colonoscopy 2022-10-28   Brother died of massive MI after shoveling snow  Suddenly in January .Fpateint and family are bereft.    Mother is suffering from dementia and father   Not sleeping well.  Wants to add 0.5 mf clonazepam at bedtime,  Taking it 3 times daily since the loff os brother,  Who left behind 2 children frm his first marriage.   Using celebrex and tramadol for back pain  Aggravated by yard work   Worried about [place].  Sister has RA and brother had Celiac.  Having low back pain ,  Knee pain    Outpatient Prescriptions Prior to Visit  Medication Sig Dispense Refill  . aspirin 81 MG tablet Take 81 mg by mouth daily.    . cyclobenzaprine (FLEXERIL) 10 MG tablet Take 1 tablet (10 mg total) by mouth at bedtime. 30 tablet 4  . DEXILANT 60 MG capsule Take 1 capsule by mouth daily.    Marland Kitchen losartan-hydrochlorothiazide (HYZAAR) 50-12.5 MG tablet Take 1 tablet by mouth daily. 90 tablet 3  . montelukast (SINGULAIR) 10 MG tablet Take 10 mg by mouth at bedtime.    . rosuvastatin (CRESTOR) 5 MG tablet TAKE 1 TABLET BY MOUTH EVERY DAY 90 tablet 1  . TAZORAC 0.05 % cream APPLY A PEA SIZED AMOUNT AT BEDTIME  3  . traMADol (ULTRAM) 50 MG tablet Take 1 tablet (50 mg total) by mouth every 8 (eight) hours as needed. 90 tablet 4  . amLODipine (NORVASC) 5 MG tablet Take 1 tablet (5 mg total) by mouth daily. 90 tablet 3  . celecoxib (CELEBREX) 200 MG capsule Take 1  capsule (200 mg total) by mouth 2 (two) times daily. 60 capsule 3  . clonazePAM (KLONOPIN) 0.5 MG tablet Take 2 tablets (1 mg total) by mouth 2 (two) times daily as needed. 120 tablet 0  . fluticasone (VERAMYST) 27.5 MCG/SPRAY nasal spray Place 2 sprays into the nose daily. (Patient not taking: Reported on 12/01/2015) 30 g 3  . Azelastine HCl 0.15 % SOLN Place 1 spray into the nose 2 (two) times daily. (Patient not taking: Reported on 12/01/2015) 30 mL 2  . hydrochlorothiazide (HYDRODIURIL) 12.5 MG tablet TAKE 1 TABLET (12.5 MG TOTAL) BY MOUTH DAILY. 90 tablet 1  . minocycline (MINOCIN,DYNACIN) 100 MG capsule TAKE 1 CAP 2 TIMES A DAY WITH A FULL GLASS OF WATER & FOOD. DO NOT LIE DOWN 1 HOUR AFTER TAKING.  0  . nystatin (MYCOSTATIN/NYSTOP) 100000 UNIT/GM POWD APPLY TOPICALLY 2 TIMES DAILY. (Patient not taking: Reported on 12/01/2015) 15 g 3  . propranolol (INDERAL) 20 MG tablet Take 1 tablet (20 mg total) by mouth 3 (three) times daily. (Patient not taking: Reported on 12/01/2015) 90 tablet 0  . tadalafil (CIALIS) 20 MG tablet Take 0.5-1 tablets (10-20 mg total) by mouth every other day as needed for erectile dysfunction. (Patient not taking: Reported on 12/01/2015) 5 tablet 11   No facility-administered medications prior  to visit.    Review of Systems;  Patient denies headache, fevers, malaise, unintentional weight loss, skin rash, eye pain, sinus congestion and sinus pain, sore throat, dysphagia,  hemoptysis , cough, dyspnea, wheezing, chest pain, palpitations, orthopnea, edema, abdominal pain, nausea, melena, diarrhea, constipation, flank pain, dysuria, hematuria, urinary  Frequency, nocturia, numbness, tingling, seizures,  Focal weakness, Loss of consciousness,  Tremor, insomnia, depression, anxiety, and suicidal ideation.      Objective:  BP 108/76 mmHg  Pulse 75  Temp(Src) 97.9 F (36.6 C) (Oral)  Resp 12  Ht '5\' 10"'$  (1.778 m)  Wt 217 lb 4 oz (98.544 kg)  BMI 31.17 kg/m2  SpO2 98%  BP  Readings from Last 3 Encounters:  12/01/15 108/76  07/28/15 134/82  05/27/15 138/72    Wt Readings from Last 3 Encounters:  12/02/15 217 lb (98.431 kg)  12/01/15 217 lb 4 oz (98.544 kg)  07/28/15 218 lb 8 oz (99.111 kg)    General appearance: alert, cooperative and appears stated age Ears: normal TM's and external ear canals both ears Throat: lips, mucosa, and tongue normal; teeth and gums normal Neck: no adenopathy, no carotid bruit, supple, symmetrical, trachea midline and thyroid not enlarged, symmetric, no tenderness/mass/nodules Back: symmetric, no curvature. ROM normal. No CVA tenderness. Lungs: clear to auscultation bilaterally Heart: regular rate and rhythm, S1, S2 normal, no murmur, click, rub or gallop Abdomen: soft, non-tender; bowel sounds normal; no masses,  no organomegaly Pulses: 2+ and symmetric Skin: Skin color, texture, turgor normal. No rashes or lesions Lymph nodes: Cervical, supraclavicular, and axillary nodes normal.  No results found for: HGBA1C  Lab Results  Component Value Date   CREATININE 1.12 07/28/2015   CREATININE 0.97 12/06/2014   CREATININE 1.10 10/28/2014    Lab Results  Component Value Date   WBC 8.8 07/28/2015   HGB 16.7 07/28/2015   HCT 50.4 07/28/2015   PLT 312.0 07/28/2015   GLUCOSE 101* 07/28/2015   CHOL 141 12/06/2014   TRIG 92.0 12/06/2014   HDL 32.60* 12/06/2014   LDLDIRECT 146.0 07/28/2015   LDLCALC 90 12/06/2014   ALT 45 07/28/2015   AST 30 07/28/2015   NA 142 07/28/2015   K 3.9 07/28/2015   CL 100 07/28/2015   CREATININE 1.12 07/28/2015   BUN 10 07/28/2015   CO2 29 07/28/2015   TSH 1.00 07/28/2015    Dg Chest 2 View  10/28/2014  CLINICAL DATA:  Chest pain EXAM: CHEST  2 VIEW COMPARISON:  02/18/2012 FINDINGS: Normal heart size. Clear lungs. No pneumothorax or pleural effusion. IMPRESSION: No active cardiopulmonary disease. Electronically Signed   By: Marybelle Killings M.D.   On: 10/28/2014 16:15    Assessment & Plan:     Problem List Items Addressed This Visit    Anxiety state    Aggravated by the unexpected death of his brother who apparently died of a massive MI after shovelling snow. Advised to use clonazepam three times daily prn for as brief a period as possible.        Hyperlipidemia with target LDL less than 130    Managed with crestor low dose.  Repeat lipids are due   Lab Results  Component Value Date   CHOL 141 12/06/2014   HDL 32.60* 12/06/2014   LDLCALC 90 12/06/2014   LDLDIRECT 146.0 07/28/2015   TRIG 92.0 12/06/2014   CHOLHDL 4 12/06/2014         Essential hypertension    Controlled on  losartan Hct for improved control with  resolution of edema.   Lab Results  Component Value Date   CREATININE 1.12 07/28/2015   Lab Results  Component Value Date   NA 142 07/28/2015   K 3.9 07/28/2015   CL 100 07/28/2015   CO2 29 07/28/2015         Family history of sudden death in brother    Patient is being referred to cardiology for evaluation and risk stratification      Relevant Orders   Ambulatory referral to Cardiology   Polyarthritis    He has chronic low back pain and bilateral knee pain.  He has a family history of RA. Checking serologies to rule out auto immune syndromes.        Other Visit Diagnoses    Polyarthritis of multiple sites [facility])    -  Primary    Relevant Orders    Sedimentation rate (Completed)    Rheumatoid factor    C-reactive protein (Completed)    Cyclic citrul peptide antibody, IgG    HLA-B27 antigen    Midline low back pain without sciatica         A total of 40 minutes was spent with patient more than half of which was spent in counseling patient on the above mentioned issues , reviewing and explaining recent labs and imaging studies done, and coordination of care.   I have discontinued Mr. Mitcham nystatin, tadalafil, amLODipine, Azelastine HCl, hydrochlorothiazide, minocycline, and propranolol. I am also having him start on nystatin  ointment. Additionally, I am having him maintain his aspirin, fluticasone, DEXILANT, montelukast, TAZORAC, losartan-hydrochlorothiazide, traMADol, cyclobenzaprine, rosuvastatin, and clonazePAM.  Meds ordered this encounter  Medications  . DISCONTD: clonazePAM (KLONOPIN) 1 MG tablet    Sig: Take 1 tablet (1 mg total) by mouth 3 (three) times daily as needed.    Dispense:  90 tablet    Refill:  3    90 day supply  . nystatin ointment (MYCOSTATIN)    Sig: Apply 1 application topically 2 (two) times daily.    Dispense:  30 g    Refill:  3  . clonazePAM (KLONOPIN) 1 MG tablet    Sig: Take 1 tablet (1 mg total) by mouth 3 (three) times daily as needed.    Dispense:  90 tablet    Refill:  3    Medications Discontinued During This Encounter  Medication Reason  . hydrochlorothiazide (HYDRODIURIL) 12.5 MG tablet Change in therapy  . minocycline (MINOCIN,DYNACIN) 100 MG capsule Completed Course  . amLODipine (NORVASC) 5 MG tablet   . clonazePAM (KLONOPIN) 0.5 MG tablet Reorder  . nystatin (MYCOSTATIN/NYSTOP) 100000 UNIT/GM POWD   . Azelastine HCl 0.15 % SOLN   . tadalafil (CIALIS) 20 MG tablet   . propranolol (INDERAL) 20 MG tablet   . clonazePAM (KLONOPIN) 1 MG tablet Reorder    Follow-up: No Follow-up on file.   Crecencio Mc, MD

## 2015-12-01 NOTE — Patient Instructions (Addendum)
[email or web address]  For compression socks  Ok to increase use of clonazepam as needed maximum 1 mg three times daily   Labs today to rule out rheumatoid arthritis

## 2015-12-01 NOTE — Progress Notes (Signed)
Pre-visit discussion using our clinic review tool. No additional management support is needed unless otherwise documented below in the visit note.  

## 2015-12-02 ENCOUNTER — Ambulatory Visit
Admission: RE | Admit: 2015-12-02 | Discharge: 2015-12-02 | Disposition: A | Discharge status: Home / Self Care | Payer: BLUE CROSS/BLUE SHIELD | Source: Ambulatory Visit | Attending: Gastroenterology | Admitting: Gastroenterology

## 2015-12-02 ENCOUNTER — Other Ambulatory Visit: Payer: Self-pay | Admitting: Internal Medicine

## 2015-12-02 DIAGNOSIS — Z8489 Family history of other specified conditions: Secondary | ICD-10-CM | POA: Insufficient documentation

## 2015-12-02 DIAGNOSIS — M2550 Pain in unspecified joint: Secondary | ICD-10-CM | POA: Insufficient documentation

## 2015-12-02 DIAGNOSIS — M130 Polyarthritis, unspecified: Secondary | ICD-10-CM | POA: Insufficient documentation

## 2015-12-02 DIAGNOSIS — R1013 Epigastric pain: Secondary | ICD-10-CM

## 2015-12-02 DIAGNOSIS — K760 Fatty (change of) liver, not elsewhere classified: Secondary | ICD-10-CM | POA: Insufficient documentation

## 2015-12-02 LAB — SEDIMENTATION RATE: Sed Rate: 6 mm/hr (ref 0–22)

## 2015-12-02 LAB — C-REACTIVE PROTEIN: CRP: 0.1 mg/dL — ABNORMAL LOW (ref 0.5–20.0)

## 2015-12-02 MED ORDER — TECHNETIUM TC 99M MEBROFENIN IV KIT
5.0000 | PACK | Freq: Once | INTRAVENOUS | Status: AC | PRN
  Administered 2015-12-02: 4.83 via INTRAVENOUS

## 2015-12-02 MED ORDER — SINCALIDE 5 MCG IJ SOLR
0.0200 ug/kg | Freq: Once | INTRAMUSCULAR | Status: AC
  Administered 2015-12-02: 2 ug via INTRAVENOUS

## 2015-12-02 NOTE — Assessment & Plan Note (Addendum)
He has chronic low back pain and bilateral knee pain.  He has a family history of RA. Checking serologies to rule out auto immune syndromes.

## 2015-12-02 NOTE — Assessment & Plan Note (Signed)
Patient is being referred to cardiology for evaluation and risk stratification

## 2015-12-02 NOTE — Assessment & Plan Note (Signed)
Aggravated by the unexpected death of his brother who apparently died of a massive MI after shovelling snow. Advised to use clonazepam three times daily prn for as brief a period as possible.

## 2015-12-02 NOTE — Assessment & Plan Note (Signed)
Controlled on  losartan Hct for improved control with resolution of edema.   Lab Results  Component Value Date   CREATININE 1.12 07/28/2015   Lab Results  Component Value Date   NA 142 07/28/2015   K 3.9 07/28/2015   CL 100 07/28/2015   CO2 29 07/28/2015

## 2015-12-02 NOTE — Assessment & Plan Note (Signed)
Managed with crestor low dose.  Repeat lipids are due   Lab Results  Component Value Date   CHOL 141 12/06/2014   HDL 32.60* 12/06/2014   LDLCALC 90 12/06/2014   LDLDIRECT 146.0 07/28/2015   TRIG 92.0 12/06/2014   CHOLHDL 4 12/06/2014

## 2015-12-03 LAB — HM COLONOSCOPY

## 2015-12-03 LAB — CYCLIC CITRUL PEPTIDE ANTIBODY, IGG: Cyclic Citrullin Peptide Ab: <16 Units

## 2015-12-03 LAB — RHEUMATOID FACTOR: Rhuematoid fact SerPl-aCnc: <10 IU/mL (ref <=14)

## 2015-12-04 ENCOUNTER — Encounter: Payer: Self-pay | Admitting: Internal Medicine

## 2015-12-10 ENCOUNTER — Encounter: Payer: Self-pay | Admitting: Internal Medicine

## 2015-12-10 LAB — HLA-B27 ANTIGEN: DNA Result:: NEGATIVE

## 2015-12-25 NOTE — Telephone Encounter (Signed)
Mailed unread message to patient.  

## 2015-12-31 ENCOUNTER — Ambulatory Visit (INDEPENDENT_AMBULATORY_CARE_PROVIDER_SITE_OTHER): Payer: BLUE CROSS/BLUE SHIELD | Admitting: Cardiology

## 2015-12-31 ENCOUNTER — Encounter: Payer: Self-pay | Admitting: Cardiology

## 2015-12-31 ENCOUNTER — Telehealth: Payer: Self-pay | Admitting: Cardiology

## 2015-12-31 VITALS — BP 128/72 | HR 75 | Ht 70.0 in | Wt 212.5 lb

## 2015-12-31 DIAGNOSIS — R002 Palpitations: Secondary | ICD-10-CM | POA: Diagnosis not present

## 2015-12-31 DIAGNOSIS — I10 Essential (primary) hypertension: Secondary | ICD-10-CM | POA: Diagnosis not present

## 2015-12-31 DIAGNOSIS — R079 Chest pain, unspecified: Secondary | ICD-10-CM | POA: Diagnosis not present

## 2015-12-31 DIAGNOSIS — Z8489 Family history of other specified conditions: Secondary | ICD-10-CM

## 2015-12-31 DIAGNOSIS — R0602 Shortness of breath: Secondary | ICD-10-CM

## 2015-12-31 NOTE — Progress Notes (Signed)
Cardiology Office Note   Date:  12/31/2015   ID:  Ronald Cordova, DOB September 02, 1965, MRN [id number]  Referring Doctor:  Crecencio Mc, MD   Cardiologist:   Wende Bushy, MD   Reason for consultation:  Chief Complaint  Patient presents with  . other    Ref by Dr. Derrel Nip for a family hx of sudden death in a brother that was age 49. Meds reviewed by the patient verbally.       History of Present Illness: Ronald Cordova is a 50 y.o. male who presents for Evaluation for chest pain and shortness of breath. This has been going on for quite some time now. He describes the chest pain as tightness in the chest, mild in severity, located in the substernal area, nonradiating associated with shortness of breath. Randomly occurring. He does walk sometimes during lunch hour but no regular or routine exercises. He is very concerned about her family history. His brother died suddenly and was told it was from a massive heart attack, at age of 70 this brother is his is over all otherwise healthy. This made him very concerned because he has a history of hypertension and hyperlipidemia.   Patient also has occasional fluttering in the chest. He mostlyat night. In the past, he was prescribed propranolol. Since he is already needing to take Klonopin at night, he is not taking propranolol for this.  No loss of consciousness, no PND, no edema. No fever cough colds abdominal pain.   ROS:  Please see the history of present illness. Aside from mentioned under HPI, all other systems are reviewed and negative.     Past Medical History  Diagnosis Date  . Treadmill stress test negative for angina pectoris 2003  . Anxiety state, unspecified   . GERD (gastroesophageal reflux disease)     carafate added by Dr. Collene Mares  . Polyp of colon, hyperplastic 01/08/2011    Juanita Craver, repeat due 2018  . Eosinophilic esophagitis 4010    by EGD with  biopsies  . Irritable bowel syndrome   . Hemorrhoids     Past Surgical  History  Procedure Laterality Date  . Nasal sinus surgery  1998    bilateral  . Knee arthroscopy  2000    left  . Joint replacement    . Carpal tunnel release  Sept 2008    R hand, by  [place], [place]   . Appendectomy      done during high school     reports that he quit smoking about 4 years ago. His smoking use included Cigarettes. He has a 15 pack-year smoking history. He has never used smokeless tobacco. He reports that he drinks alcohol. He reports that he does not use illicit drugs.   family history includes BRCA 1/2 in his maternal aunt; Heart attack (age of onset: 73) in his brother; Hyperlipidemia in his brother, father, mother, and sister; Hypertension in his father and mother.   Current Outpatient Prescriptions  Medication Sig Dispense Refill  . aspirin 81 MG tablet Take 81 mg by mouth daily.    . celecoxib (CELEBREX) 200 MG capsule TAKE ONE CAPSULE BY MOUTH TWICE A DAY 60 capsule 3  . clonazePAM (KLONOPIN) 1 MG tablet Take 1 tablet (1 mg total) by mouth 3 (three) times daily as needed. 90 tablet 3  . cyclobenzaprine (FLEXERIL) 10 MG tablet Take 1 tablet (10 mg total) by mouth at bedtime. 30 tablet 4  . DEXILANT 60 MG capsule  Take 1 capsule by mouth daily.    . fluticasone (VERAMYST) 27.5 MCG/SPRAY nasal spray Place 2 sprays into the nose daily. 30 g 3  . losartan-hydrochlorothiazide (HYZAAR) 50-12.5 MG tablet Take 1 tablet by mouth daily. 90 tablet 3  . montelukast (SINGULAIR) 10 MG tablet Take 10 mg by mouth at bedtime.    Marland Kitchen nystatin ointment (MYCOSTATIN) Apply 1 application topically 2 (two) times daily. 30 g 3  . rosuvastatin (CRESTOR) 5 MG tablet TAKE 1 TABLET BY MOUTH EVERY DAY 90 tablet 1  . TAZORAC 0.05 % cream APPLY A PEA SIZED AMOUNT AT BEDTIME  3  . traMADol (ULTRAM) 50 MG tablet Take 1 tablet (50 mg total) by mouth every 8 (eight) hours as needed. 90 tablet 4   No current facility-administered medications for this visit.    Allergies: Penicillins     PHYSICAL EXAM: VS:  BP 128/72 mmHg  Pulse 75  Ht 5' 10" (1.778 m)  Wt 212 lb 8 oz (96.389 kg)  BMI 30.49 kg/m2 , Body mass index is 30.49 kg/(m^2). Wt Readings from Last 3 Encounters:  12/31/15 212 lb 8 oz (96.389 kg)  12/02/15 217 lb (98.431 kg)  12/01/15 217 lb 4 oz (98.544 kg)    GENERAL:  well developed, well nourished, obese, not in acute distress HEENT: normocephalic, pink conjunctivae, anicteric sclerae, no xanthelasma, normal dentition, oropharynx clear NECK:  no neck vein engorgement, JVP normal, no hepatojugular reflux, carotid upstroke brisk and symmetric, no bruit, no thyromegaly, no lymphadenopathy LUNGS:  good respiratory effort, clear to auscultation bilaterally CV:  PMI not displaced, no thrills, no lifts, S1 and S2 within normal limits, no palpable S3 or S4, no murmurs, no rubs, no gallops ABD:  Soft, nontender, nondistended, normoactive bowel sounds, no abdominal aortic bruit, no hepatomegaly, no splenomegaly MS: nontender back, no kyphosis, no scoliosis, no joint deformities EXT:  2+ DP/PT pulses, no edema, no varicosities, no cyanosis, no clubbing SKIN: warm, nondiaphoretic, normal turgor, no ulcers NEUROPSYCH: alert, oriented to person, place, and time, sensory/motor grossly intact, normal mood, appropriate affect  Recent Labs: 07/28/2015: ALT 45; BUN 10; Creatinine, Ser 1.12; Hemoglobin 16.7; Platelets 312.0; Potassium 3.9; Sodium 142; TSH 1.00   Lipid Panel    Component Value Date/Time   CHOL 141 12/06/2014 1408   TRIG 92.0 12/06/2014 1408   HDL 32.60* 12/06/2014 1408   CHOLHDL 4 12/06/2014 1408   VLDL 18.4 12/06/2014 1408   LDLCALC 90 12/06/2014 1408   LDLDIRECT 146.0 07/28/2015 1835     Other studies Reviewed:  EKG:  The ekg from 05/17/2017as personally reviewed by me and it revealed sinus rhythm, 75 BPM   Additional studies/ records that were reviewed personally reviewed by me today include: None available   ASSESSMENT AND PLAN: Chest  pain Shortness of breath Family history of possible premature CAD or sudden cardiac death Recommend to evaluate for ischemia with nuclear exercise stress test and echocardiogram.   if stress testing is essentially negative, may recommend calcium score for further risk stratification.  Palpitations Recommend 24-hour Holter monitor  Hypertension BP is well controlled. Continue monitoring BP. Continue current medical therapy and lifestyle changes.  Hyperlipidemia Patient will follow-up with PCP. He says he is due for repeat blood work.  Current medicines are reviewed at length with the patient today.  The patient does not have concerns regarding medicines.  Labs/ tests ordered today include:  Orders Placed This Encounter  Procedures  . NM Myocar Multi W/Spect W/Wall Motion / EF  .  Holter monitor - 24 hour  . EKG 12-Lead  . ECHOCARDIOGRAM COMPLETE    I had a lengthy and detailed discussion with the patient regarding diagnoses, prognosis, diagnostic options, treatment options.   I counseled the patient on importance of lifestyle modification including heart healthy diet, regular physical activity.   Disposition:   FU with undersigned after tests    Signed, Wende Bushy, MD  12/31/2015 3:44 PM    [place]

## 2015-12-31 NOTE — Patient Instructions (Addendum)
Medication Instructions:  Your physician recommends that you continue on your current medications as directed. Please refer to the Current Medication list given to you today.   Labwork: None ordered  Testing/Procedures: Your physician has recommended that you wear a holter monitor. Holter monitors are medical devices that record the heart's electrical activity. Doctors most often use these monitors to diagnose arrhythmias. Arrhythmias are problems with the speed or rhythm of the heartbeat. The monitor is a small, portable device. You can wear one while you do your normal daily activities. This is usually used to diagnose what is causing palpitations/syncope (passing out).  Date & Time: _______________________________________________________  Your physician has requested that you have an echocardiogram. Echocardiography is a painless test that uses sound waves to create images of your heart. It provides your doctor with information about the size and shape of your heart and how well your heart's chambers and valves are working. This procedure takes approximately one hour. There are no restrictions for this procedure.  Date & Time: _______________________________________________________  [facility] Stress test  Your caregiver has ordered a Stress Test with nuclear imaging. The purpose of this test is to evaluate the blood supply to your heart muscle. Cardiac stress tests are done to find areas of poor blood flow to the heart by determining the extent of coronary artery disease (CAD).    Please note: these test may take anywhere between 2-4 hours to complete  PLEASE REPORT TO [facility]ICAL [facility]ALL ENTRANCE  THE VOLUNTEERS AT THE FIRST DESK WILL DIRECT YOU WHERE TO GO  Date of Procedure:___Friday January 16, 2016 at 07:30AM______________  Arrival Time for Procedure:____Arrive at 07:15AM to register_________  Instructions regarding medication:   __X__ : Hold diabetes medication morning of  procedure  PLEASE NOTIFY THE OFFICE AT LEAST 24 HOURS IN ADVANCE IF YOU ARE UNABLE TO KEEP YOUR APPOINTMENT.  [phone] AND  PLEASE NOTIFY NUCLEAR MEDICINE AT [facility] AT LEAST 24 HOURS IN ADVANCE IF YOU ARE UNABLE TO KEEP YOUR APPOINTMENT. [phone]  How to prepare for your Myoview test:   Do not eat or drink after midnight  No caffeine for 24 hours prior to test  No smoking 24 hours prior to test.  Your medication may be taken with water.  If your doctor stopped a medication because of this test, do not take that medication.  Ladies, please do not wear dresses.  Skirts or pants are appropriate. Please wear a short sleeve shirt.  No perfume, cologne or lotion.  Wear comfortable walking shoes. No heels!    Follow-Up: Your physician recommends that you schedule a follow-up appointment after testing to review results.  Date & Time: __________________________________________________________________   Any Other Special Instructions Will Be Listed Below (If Applicable).     If you need a refill on your cardiac medications before your next appointment, please call your pharmacy.  Holter Monitoring A Holter monitor is a small device that is used to detect abnormal heart rhythms. It clips to your clothing and is connected by wires to flat, sticky disks (electrodes) that attach to your chest. It is worn continuously for 24-48 hours. HOME CARE INSTRUCTIONS  Wear your Holter monitor at all times, even while exercising and sleeping, for as long as directed by your health care provider.  Make sure that the Holter monitor is safely clipped to your clothing or close to your body as recommended by your health care provider.  Do not get the monitor or wires wet.  Do not put body lotion  or moisturizer on your chest.  Keep your skin clean.  Keep a diary of your daily activities, such as walking and doing chores. If you feel that your heartbeat is abnormal or that your heart is  fluttering or skipping a beat:  Record what you are doing when it happens.  Record what time of day the symptoms occur.  Return your Holter monitor as directed by your health care provider.  Keep all follow-up visits as directed by your health care provider. This is important. SEEK IMMEDIATE MEDICAL CARE IF:  You feel lightheaded or you faint.  You have trouble breathing.  You feel pain in your chest, upper arm, or jaw.  You feel sick to your stomach and your skin is pale, cool, or damp.  You heartbeat feels unusual or abnormal.   This information is not intended to replace advice given to you by your health care provider. Make sure you discuss any questions you have with your health care provider.   Document Released: 04/30/2004 Document Revised: 08/23/2014 Document Reviewed: 03/11/2014 Elsevier Interactive Patient Education 2016 [employer]   Echocardiogram An echocardiogram, or echocardiography, uses sound waves (ultrasound) to produce an image of your heart. The echocardiogram is simple, painless, obtained within a short period of time, and offers valuable information to your health care provider. The images from an echocardiogram can provide information such as:  Evidence of coronary artery disease (CAD).  Heart size.  Heart muscle function.  Heart valve function.  Aneurysm detection.  Evidence of a past heart attack.  Fluid buildup around the heart.  Heart muscle thickening.  Assess heart valve function. LET [facility]OUR [facility]ARE PROVIDER KNOW ABOUT:  Any allergies you have.  All medicines you are taking, including vitamins, herbs, eye drops, creams, and over-the-counter medicines.  Previous problems you or members of your family have had with the use of anesthetics.  Any blood disorders you have.  Previous surgeries you have had.  Medical conditions you have.  Possibility of pregnancy, if this applies. BEFORE THE PROCEDURE  No special preparation is  needed. Eat and drink normally.  PROCEDURE   In order to produce an image of your heart, gel will be applied to your chest and a wand-like tool (transducer) will be moved over your chest. The gel will help transmit the sound waves from the transducer. The sound waves will harmlessly bounce off your heart to allow the heart images to be captured in real-time motion. These images will then be recorded.  You may need an IV to receive a medicine that improves the quality of the pictures. AFTER THE PROCEDURE You may return to your normal schedule including diet, activities, and medicines, unless your health care provider tells you otherwise.   This information is not intended to replace advice given to you by your health care provider. Make sure you discuss any questions you have with your health care provider.   Document Released: 07/30/2000 Document Revised: 08/23/2014 Document Reviewed: 04/09/2013 Elsevier Interactive Patient Education 2016 Elsevier Inc.   Cardiac Nuclear Scanning A cardiac nuclear scan is used to check your heart for problems, such as the following:  A portion of the heart is not getting enough blood.  Part of the heart muscle has died, which happens with a heart attack.  The heart wall is not working normally.  In this test, a radioactive dye (tracer) is injected into your bloodstream. After the tracer has traveled to your heart, a scanning device is used to measure how  much of the tracer is absorbed by or distributed to various areas of your heart. LET [facility] CARE PROVIDER KNOW ABOUT:  Any allergies you have.  All medicines you are taking, including vitamins, herbs, eye drops, creams, and over-the-counter medicines.  Previous problems you or members of your family have had with the use of anesthetics.  Any blood disorders you have.  Previous surgeries you have had.  Medical conditions you have.  RISKS AND COMPLICATIONS Generally, this is a safe  procedure. However, as with any procedure, problems can occur. Possible problems include:   Serious chest pain.  Rapid heartbeat.  Sensation of warmth in your chest. This usually passes quickly. BEFORE THE PROCEDURE Ask your health care provider about changing or stopping your regular medicines. PROCEDURE This procedure is usually done at a hospital and takes 2-4 hours.  An IV tube is inserted into one of your veins.  Your health care provider will inject a small amount of radioactive tracer through the tube.  You will then wait for 20-40 minutes while the tracer travels through your bloodstream.  You will lie down on an exam table so images of your heart can be taken. Images will be taken for about 15-20 minutes.  You will exercise on a treadmill or stationary bike. While you exercise, your heart activity will be monitored with an electrocardiogram (ECG), and your blood pressure will be checked.  If you are unable to exercise, you may be given a medicine to make your heart beat faster.  When blood flow to your heart has peaked, tracer will again be injected through the IV tube.  After 20-40 minutes, you will get back on the exam table and have more images taken of your heart.  When the procedure is over, your IV tube will be removed. AFTER THE PROCEDURE  You will likely be able to leave shortly after the test. Unless your health care provider tells you otherwise, you may return to your normal schedule, including diet, activities, and medicines.  Make sure you find out how and when you will get your test results.   This information is not intended to replace advice given to you by your health care provider. Make sure you discuss any questions you have with your health care provider.   Document Released: 08/27/2004 Document Revised: 08/07/2013 Document Reviewed: 07/11/2013 Elsevier Interactive Patient Education [employer]

## 2015-12-31 NOTE — Telephone Encounter (Signed)
Patient wants to wait until he sees work schedule to set up future appt. Patient needs holter echo and fu with ingal.   Patient will call jennifer at the office after Monday .

## 2016-01-02 NOTE — Telephone Encounter (Signed)
Spoke with patient and let him know that his precert is good for 30 days for his stress test and if he needs to reschedule to just let us know and we can adjust it. He verbalized understanding and had no further questions at this time.

## 2016-01-02 NOTE — Telephone Encounter (Signed)
It's a possibility.  Would have to wait and get permission from [place] to add [place].   Will fwd this to [place] and wait to hear back on Monday.  Will call patient to let him know and fu with you.  Thanks pam.

## 2016-01-14 ENCOUNTER — Telehealth: Payer: Self-pay | Admitting: Cardiology

## 2016-01-14 NOTE — Telephone Encounter (Signed)
Reviewed instructions for exercise myoview that is scheduled for Friday 01/16/16 at 07:30AM. Patient verbalized understanding of instructions, appointment time, and location. He had no further questions and is aware to call us back if needed.

## 2016-01-16 ENCOUNTER — Other Ambulatory Visit: Payer: Self-pay

## 2016-01-16 ENCOUNTER — Ambulatory Visit
Admission: RE | Admit: 2016-01-16 | Discharge: 2016-01-16 | Disposition: A | Discharge status: Home / Self Care | Payer: BLUE CROSS/BLUE SHIELD | Source: Ambulatory Visit | Attending: Cardiology | Admitting: Cardiology

## 2016-01-16 ENCOUNTER — Ambulatory Visit (INDEPENDENT_AMBULATORY_CARE_PROVIDER_SITE_OTHER): Payer: BLUE CROSS/BLUE SHIELD

## 2016-01-16 DIAGNOSIS — R002 Palpitations: Secondary | ICD-10-CM

## 2016-01-16 DIAGNOSIS — I10 Essential (primary) hypertension: Secondary | ICD-10-CM

## 2016-01-16 DIAGNOSIS — Z8489 Family history of other specified conditions: Secondary | ICD-10-CM

## 2016-01-16 LAB — NM MYOCAR MULTI W/SPECT W/WALL MOTION / EF
CHL CUP RESTING HR STRESS: 68 {beats}/min
CSEPED: 8 min
CSEPEDS: 46 s
CSEPPHR: 153 {beats}/min
Estimated workload: 10.1 METS
LV dias vol: 86 mL (ref 62–150)
LVSYSVOL: 44 mL
Percent HR: 89 %
SDS: 0
SRS: 17
SSS: 2
TID: 0.84

## 2016-01-16 MED ORDER — TECHNETIUM TC 99M TETROFOSMIN IV KIT
13.0000 | PACK | Freq: Once | INTRAVENOUS | Status: AC | PRN
  Administered 2016-01-16: 14.019 via INTRAVENOUS

## 2016-01-16 MED ORDER — TECHNETIUM TC 99M TETROFOSMIN IV KIT
30.5480 | PACK | Freq: Once | INTRAVENOUS | Status: AC | PRN
  Administered 2016-01-16: 30.548 via INTRAVENOUS

## 2016-01-23 ENCOUNTER — Ambulatory Visit
Admission: RE | Admit: 2016-01-23 | Discharge: 2016-01-23 | Disposition: A | Discharge status: Home / Self Care | Payer: BLUE CROSS/BLUE SHIELD | Source: Ambulatory Visit | Attending: Cardiology | Admitting: Cardiology

## 2016-01-23 DIAGNOSIS — R002 Palpitations: Secondary | ICD-10-CM | POA: Insufficient documentation

## 2016-01-23 DIAGNOSIS — E785 Hyperlipidemia, unspecified: Secondary | ICD-10-CM | POA: Diagnosis not present

## 2016-01-23 DIAGNOSIS — R079 Chest pain, unspecified: Secondary | ICD-10-CM | POA: Diagnosis not present

## 2016-01-23 DIAGNOSIS — R0602 Shortness of breath: Secondary | ICD-10-CM | POA: Insufficient documentation

## 2016-01-23 DIAGNOSIS — I10 Essential (primary) hypertension: Secondary | ICD-10-CM | POA: Diagnosis not present

## 2016-02-05 ENCOUNTER — Other Ambulatory Visit: Payer: Self-pay | Admitting: Internal Medicine

## 2016-02-05 NOTE — Telephone Encounter (Signed)
Refill request for Flexeril, last seen 17APR2017, last filled 12DEC2016.  Please advise.

## 2016-02-05 NOTE — Telephone Encounter (Signed)
Ok to refill,  Refill sent  

## 2016-02-25 ENCOUNTER — Other Ambulatory Visit: Payer: Self-pay | Admitting: Internal Medicine

## 2016-03-09 ENCOUNTER — Other Ambulatory Visit: Payer: Self-pay | Admitting: Internal Medicine

## 2016-03-09 NOTE — Telephone Encounter (Signed)
REFILLED

## 2016-03-09 NOTE — Telephone Encounter (Signed)
LOV 12/01/2015. Ronald Cordova, CMA  

## 2016-03-19 ENCOUNTER — Ambulatory Visit (INDEPENDENT_AMBULATORY_CARE_PROVIDER_SITE_OTHER): Payer: BLUE CROSS/BLUE SHIELD | Admitting: Family Medicine

## 2016-03-19 ENCOUNTER — Encounter: Payer: Self-pay | Admitting: Family Medicine

## 2016-03-19 ENCOUNTER — Ambulatory Visit (INDEPENDENT_AMBULATORY_CARE_PROVIDER_SITE_OTHER): Payer: BLUE CROSS/BLUE SHIELD

## 2016-03-19 VITALS — BP 129/86 | HR 81 | Temp 97.9°F | Wt 207.4 lb

## 2016-03-19 DIAGNOSIS — M25522 Pain in left elbow: Secondary | ICD-10-CM | POA: Diagnosis not present

## 2016-03-19 DIAGNOSIS — M25532 Pain in left wrist: Secondary | ICD-10-CM

## 2016-03-19 DIAGNOSIS — M25539 Pain in unspecified wrist: Secondary | ICD-10-CM | POA: Insufficient documentation

## 2016-03-19 DIAGNOSIS — M25529 Pain in unspecified elbow: Secondary | ICD-10-CM | POA: Insufficient documentation

## 2016-03-19 NOTE — Progress Notes (Signed)
Pre visit review using our clinic review tool, if applicable. No additional management support is needed unless otherwise documented below in the visit note. 

## 2016-03-19 NOTE — Patient Instructions (Signed)
We will call with your xray results.  Use the celebrex and tramadol as needed.  Take care  Dr. Adriana Simas

## 2016-03-19 NOTE — Progress Notes (Signed)
Subjective:  Patient ID: Ronald Cordova, male    DOB: [birth year]  Age: 50 y.o. MRN: [id number]  CC: Fall/Left hand injury.  HPI:  50 year old male presents with the above complaints.  Patient states that he was in his garage and slipped and fell landing on an outstretched left hand. Patient states he had immediate swelling. Since that time he's had significant wrist pain as well as ulnar numbness and tingling. He's been taking Celebrex as needed. Exacerbated by certain movements. No relieving factors. No other medications dry. No other complaints at this time  Social Hx   Social History   Social History  . Marital status: Single    Spouse name: N/A  . Number of children: N/A  . Years of education: N/A   Social History Main Topics  . Smoking status: Former Smoker    Packs/day: 1.00    Years: 15.00    Types: Cigarettes    Quit date: 05/17/2011  . Smokeless tobacco: Never Used  . Alcohol use Yes     Comment: occasional  . Drug use: No  . Sexual activity: Not Asked   Other Topics Concern  . None   Social History Narrative  . None   Review of Systems  Constitutional: Negative.   Musculoskeletal:       Fall - left wrist and left elbow pain.   Objective:  BP 129/86 (BP Location: Right Arm, Patient Position: Sitting, Cuff Size: Normal)   Pulse 81   Temp 97.9 F (36.6 C) (Oral)   Wt 207 lb 6 oz (94.1 kg)   SpO2 97%   BMI 29.76 kg/m   BP/Weight 03/19/2016 12/31/2015 12/02/2015  Systolic BP 129 128 -  Diastolic BP 86 72 -  Wt. (Lbs) 207.38 212.5 217  BMI 29.76 30.49 31.14   Physical Exam  Constitutional: He is oriented to person, place, and time. He appears well-developed. No distress.  Pulmonary/Chest: Effort normal.  Musculoskeletal:  Wrist: Left Inspection normal with no visible erythema or swelling. Tenderness in the middle of the left wrist as well as radially. Full passive ROM but pain with flexion.  Elbow: Left Normal inspection. Tenderness over the  medial epicondyle.  Neurological: He is alert and oriented to person, place, and time.  Psychiatric: He has a normal mood and affect.  Vitals reviewed.   Lab Results  Component Value Date   WBC 8.8 07/28/2015   HGB 16.7 07/28/2015   HCT 50.4 07/28/2015   PLT 312.0 07/28/2015   GLUCOSE 101 (H) 07/28/2015   CHOL 141 12/06/2014   TRIG 92.0 12/06/2014   HDL 32.60 (L) 12/06/2014   LDLDIRECT 146.0 07/28/2015   LDLCALC 90 12/06/2014   ALT 45 07/28/2015   AST 30 07/28/2015   NA 142 07/28/2015   K 3.9 07/28/2015   CL 100 07/28/2015   CREATININE 1.12 07/28/2015   BUN 10 07/28/2015   CO2 29 07/28/2015   TSH 1.00 07/28/2015   Assessment & Plan:   Problem List Items Addressed This Visit    Elbow pain    New problem. FOOSH injury. Given tenderness and mechanism of injury, obtaining xray. Advised PRN Celebrex and Tylenol for pain.      Relevant Orders   DG Elbow Complete Left   Wrist pain, acute - Primary    New problem. FOOSH injury. Given tenderness and mechanism of injury, obtaining xray. Advised PRN Celebrex and Tylenol for pain.      Relevant Orders   DG Wrist Complete Left  Other Visit Diagnoses   None.    Follow-up: PRN  Nubieber

## 2016-03-19 NOTE — Assessment & Plan Note (Signed)
New problem. FOOSH injury. Given tenderness and mechanism of injury, obtaining xray. Advised PRN Celebrex and Tylenol for pain. 

## 2016-03-19 NOTE — Assessment & Plan Note (Signed)
New problem. FOOSH injury. Given tenderness and mechanism of injury, obtaining xray. Advised PRN Celebrex and Tylenol for pain.

## 2016-03-22 ENCOUNTER — Other Ambulatory Visit: Payer: Self-pay | Admitting: Family Medicine

## 2016-03-22 DIAGNOSIS — R202 Paresthesia of skin: Secondary | ICD-10-CM

## 2016-04-05 ENCOUNTER — Other Ambulatory Visit: Payer: Self-pay | Admitting: Internal Medicine

## 2016-05-25 ENCOUNTER — Other Ambulatory Visit: Payer: Self-pay | Admitting: Internal Medicine

## 2016-05-26 NOTE — Telephone Encounter (Signed)
Refill for 30 days only.  OFFICE VISIT NEEDED prior to any more refills, per controlled substance policy

## 2016-05-26 NOTE — Telephone Encounter (Signed)
Last filled 04/06/16. Last seen 12/01/15. No follow up visit on file.

## 2016-05-28 ENCOUNTER — Encounter: Payer: Self-pay | Admitting: *Deleted

## 2016-05-28 NOTE — Telephone Encounter (Signed)
Mychart message sent to patient.

## 2016-06-28 ENCOUNTER — Encounter: Payer: Self-pay | Admitting: Internal Medicine

## 2016-06-28 ENCOUNTER — Ambulatory Visit: Payer: Self-pay | Admitting: Internal Medicine

## 2016-06-28 ENCOUNTER — Ambulatory Visit (INDEPENDENT_AMBULATORY_CARE_PROVIDER_SITE_OTHER): Payer: BLUE CROSS/BLUE SHIELD | Admitting: Internal Medicine

## 2016-06-28 VITALS — BP 116/78 | HR 71 | Temp 97.7°F | Resp 12 | Ht 70.0 in | Wt 206.5 lb

## 2016-06-28 DIAGNOSIS — R911 Solitary pulmonary nodule: Secondary | ICD-10-CM

## 2016-06-28 DIAGNOSIS — E785 Hyperlipidemia, unspecified: Secondary | ICD-10-CM | POA: Diagnosis not present

## 2016-06-28 DIAGNOSIS — I10 Essential (primary) hypertension: Secondary | ICD-10-CM | POA: Diagnosis not present

## 2016-06-28 DIAGNOSIS — F17201 Nicotine dependence, unspecified, in remission: Secondary | ICD-10-CM

## 2016-06-28 DIAGNOSIS — G5622 Lesion of ulnar nerve, left upper limb: Secondary | ICD-10-CM

## 2016-06-28 DIAGNOSIS — E559 Vitamin D deficiency, unspecified: Secondary | ICD-10-CM | POA: Diagnosis not present

## 2016-06-28 DIAGNOSIS — F411 Generalized anxiety disorder: Secondary | ICD-10-CM

## 2016-06-28 DIAGNOSIS — R002 Palpitations: Secondary | ICD-10-CM

## 2016-06-28 MED ORDER — TRAMADOL HCL 50 MG PO TABS: ORAL_TABLET | ORAL | 5 refills | Status: DC

## 2016-06-28 MED ORDER — CELECOXIB 200 MG PO CAPS: 200.0000 mg | ORAL_CAPSULE | Freq: Two times a day (BID) | ORAL | 5 refills | Status: DC

## 2016-06-28 MED ORDER — NYSTATIN 100000 UNIT/GM EX POWD: Freq: Four times a day (QID) | CUTANEOUS | 2 refills | Status: DC

## 2016-06-28 MED ORDER — CLONAZEPAM 1 MG PO TABS: 1.0000 mg | ORAL_TABLET | Freq: Two times a day (BID) | ORAL | 2 refills | Status: DC

## 2016-06-28 NOTE — Progress Notes (Signed)
Subjective:  Patient ID: Ronald Cordova, male    DOB: [birth year]  Age: 50 y.o. MRN: [id number]  CC: The primary encounter diagnosis was Hyperlipidemia with target LDL less than 130. Diagnoses of Essential hypertension, Vitamin D deficiency, Tobacco abuse, in remission, Anxiety state, Incidental pulmonary nodule, > 3mm and < 8mm, Ulnar neuropathy at elbow of left upper extremity, and Palpitations were also pertinent to this visit.  HPI Ronald BattenGregory S Cordova presents for follow up on  Hyperlipoidemia, GERD and GAD managed with clonazepam .  Last seen in April . atient is taking his medications as prescribed and notes no adverse effects.  Home BP readings have been done about once per week and are  generally < 130/80 .  She is avoiding added salt in her diet and walking regularly about 3 times per week for exercise  .  Has a 10 hour drive 5 days per week.  Gest out every 2-3 hours .  Not drinkgin sodas and tea.  Empties bladder every 2-3 hours   Left elbow pain due to ulnar nerve entrapmentand carpal tunnel syndrome by EMG .  Prior CTS right wrist. Seeing ortman a hand surgeon   Taking celebrex bid and prn tarmadol bid at the most which has helped.   Having some conflict with sister who has remarried after a year of being widowed. She married a Psychologist, sport and exercisedefrocked minister who "outed" him while on a missionary trip which included colleagues and a Furniture conservator/restorerunion representative.  Has caused a lot of family conflict   Recurrent inguinal itching and moistness aggravated by prlonged sitting in truck     Outpatient Medications Prior to Visit  Medication Sig Dispense Refill  . aspirin 81 MG tablet Take 81 mg by mouth daily.    . cyclobenzaprine (FLEXERIL) 10 MG tablet TAKE 1 TABLET BY MOUTH AT BEDTIME 30 tablet 4  . DEXILANT 60 MG capsule Take 1 capsule by mouth daily.    . fluticasone (VERAMYST) 27.5 MCG/SPRAY nasal spray Place 2 sprays into the nose daily. 30 g 3  . losartan-hydrochlorothiazide (HYZAAR) 50-12.5 MG tablet  Take 1 tablet by mouth daily. 90 tablet 3  . montelukast (SINGULAIR) 10 MG tablet Take 10 mg by mouth at bedtime.    . rosuvastatin (CRESTOR) 5 MG tablet TAKE 1 TABLET BY MOUTH EVERY DAY 90 tablet 1  . celecoxib (CELEBREX) 200 MG capsule TAKE ONE CAPSULE BY MOUTH TWICE A DAY 60 capsule 2  . clonazePAM (KLONOPIN) 1 MG tablet TAKE 1 TABLET BY MOUTH 3 TIMES A DAY AS NEEDED FOR ANXIETY 90 tablet 0  . nystatin ointment (MYCOSTATIN) Apply 1 application topically 2 (two) times daily. 30 g 3  . traMADol (ULTRAM) 50 MG tablet TAKE 1 TABLET BY MOUTH EVERY EIGHT HOURS AS NEEDED 90 tablet 2  . TAZORAC 0.05 % cream APPLY A PEA SIZED AMOUNT AT BEDTIME  3   No facility-administered medications prior to visit.     Review of Systems;  Patient denies headache, fevers, malaise, unintentional weight loss, skin rash, eye pain, sinus congestion and sinus pain, sore throat, dysphagia,  hemoptysis , cough, dyspnea, wheezing, chest pain, palpitations, orthopnea, edema, abdominal pain, nausea, melena, diarrhea, constipation, flank pain, dysuria, hematuria, urinary  Frequency, nocturia, numbness, tingling, seizures,  Focal weakness, Loss of consciousness,  Tremor, insomnia, depression, anxiety, and suicidal ideation.      Objective:  BP 116/78   Pulse 71   Temp 97.7 F (36.5 C) (Oral)   Resp 12   Ht 5'  10" (1.778 m)   Wt 206 lb 8 oz (93.7 kg)   SpO2 98%   BMI 29.63 kg/m   BP Readings from Last 3 Encounters:  06/28/16 116/78  03/19/16 129/86  12/31/15 128/72    Wt Readings from Last 3 Encounters:  06/28/16 206 lb 8 oz (93.7 kg)  03/19/16 207 lb 6 oz (94.1 kg)  12/31/15 212 lb 8 oz (96.4 kg)    General appearance: alert, cooperative and appears stated age Ears: normal TM's and external ear canals both ears Throat: lips, mucosa, and tongue normal; teeth and gums normal Neck: no adenopathy, no carotid bruit, supple, symmetrical, trachea midline and thyroid not enlarged, symmetric, no  tenderness/mass/nodules Back: symmetric, no curvature. ROM normal. No CVA tenderness. Lungs: clear to auscultation bilaterally Heart: regular rate and rhythm, S1, S2 normal, no murmur, click, rub or gallop Abdomen: soft, non-tender; bowel sounds normal; no masses,  no organomegaly Pulses: 2+ and symmetric Skin: Skin color, texture, turgor normal. No rashes or lesions Lymph nodes: Cervical, supraclavicular, and axillary nodes normal.  No results found for: HGBA1C  Lab Results  Component Value Date   CREATININE 1.12 07/28/2015   CREATININE 0.97 12/06/2014   CREATININE 1.10 10/28/2014    Lab Results  Component Value Date   WBC 8.8 07/28/2015   HGB 16.7 07/28/2015   HCT 50.4 07/28/2015   PLT 312.0 07/28/2015   GLUCOSE 101 (H) 07/28/2015   CHOL 141 12/06/2014   TRIG 92.0 12/06/2014   HDL 32.60 (L) 12/06/2014   LDLDIRECT 146.0 07/28/2015   LDLCALC 90 12/06/2014   ALT 45 07/28/2015   AST 30 07/28/2015   NA 142 07/28/2015   K 3.9 07/28/2015   CL 100 07/28/2015   CREATININE 1.12 07/28/2015   BUN 10 07/28/2015   CO2 29 07/28/2015   TSH 1.00 07/28/2015    No results found.  Assessment & Plan:   Problem List Items Addressed This Visit    Anxiety state    Aggravated by the unexpected death of his brother who apparently died of a massive MI after shovelling snow. Advised to reduce clonazepam to twice daily         Tobacco abuse, in remission    He has been abstinent for 5 years.  Congratulated and encouraged to avoid situations that increase risk for relapse.      Incidental pulmonary nodule, > 3mm and < 8mm    He has not had follow up CT since Dec 2013 pulmonology evaluation .  Will recommend repeating CT      Palpitations    No serious arrhtyhmias by recent cardiology evaluation.       Ulnar neuropathy at elbow of left upper extremity    Secondary to ulnar spurring.  Confirmed By EMG studies ordered by orthopedist. Continue celebrex and tramadol.       Relevant  Medications   clonazePAM (KLONOPIN) 1 MG tablet   Hyperlipidemia with target LDL less than 130 - Primary   Relevant Orders   Lipid panel   Essential hypertension   Relevant Orders   Comprehensive metabolic panel    Other Visit Diagnoses    Vitamin D deficiency       Relevant Orders   VITAMIN D 25 Hydroxy (Vit-D Deficiency, Fractures)     A total of 25 minutes of face to face time was spent with patient more than half of which was spent in counselling about the above mentioned conditions  and coordination of care   I have  discontinued Mr. Leda GauzeKing's TAZORAC and nystatin ointment. I have also changed his celecoxib. Additionally, I am having him start on nystatin. Lastly, I am having him maintain his aspirin, fluticasone, DEXILANT, montelukast, losartan-hydrochlorothiazide, cyclobenzaprine, rosuvastatin, traMADol, and clonazePAM.  Meds ordered this encounter  Medications  . DISCONTD: clonazePAM (KLONOPIN) 1 MG tablet    Sig: Take 1 tablet (1 mg total) by mouth 2 (two) times daily.    Dispense:  60 tablet    Refill:  2    Refill for 30 days only.  OFFICE VISIT NEEDED prior to any more refills  . celecoxib (CELEBREX) 200 MG capsule    Sig: Take 1 capsule (200 mg total) by mouth 2 (two) times daily.    Dispense:  60 capsule    Refill:  5  . DISCONTD: traMADol (ULTRAM) 50 MG tablet    Sig: TAKE 1 TABLET BY MOUTH EVERY EIGHT HOURS AS NEEDED    Dispense:  60 tablet    Refill:  5    This request is for a new prescription for a controlled substance as required by Federal/State law..  . traMADol (ULTRAM) 50 MG tablet    Sig: TAKE 1 TABLET BY MOUTH EVERY EIGHT HOURS AS NEEDED    Dispense:  90 tablet    Refill:  5    This request is for a new prescription for a controlled substance as required by Federal/State law..  . clonazePAM (KLONOPIN) 1 MG tablet    Sig: Take 1 tablet (1 mg total) by mouth 2 (two) times daily.    Dispense:  60 tablet    Refill:  2  . nystatin (MYCOSTATIN/NYSTOP)  powder    Sig: Apply topically 4 (four) times daily.    Dispense:  30 g    Refill:  2    Medications Discontinued During This Encounter  Medication Reason  . TAZORAC 0.05 % cream Completed Course  . clonazePAM (KLONOPIN) 1 MG tablet Reorder  . celecoxib (CELEBREX) 200 MG capsule Reorder  . traMADol (ULTRAM) 50 MG tablet Reorder  . traMADol (ULTRAM) 50 MG tablet Reorder  . clonazePAM (KLONOPIN) 1 MG tablet Reorder  . nystatin ointment (MYCOSTATIN)     Follow-up: Return in about 6 months (around 12/26/2016) for fastig labs this week .   Sherlene ShamsULLO, Paysen Goza L, MD

## 2016-06-28 NOTE — Patient Instructions (Addendum)
I am advising you to reduce your clonazepam to two times daily .  I have refilled your tramadol and your celebrex .  It would be better to lower your celebrex dose to once daily unless your pain is acutely  worse.

## 2016-06-29 DIAGNOSIS — G5622 Lesion of ulnar nerve, left upper limb: Secondary | ICD-10-CM | POA: Insufficient documentation

## 2016-06-29 NOTE — Assessment & Plan Note (Signed)
He has not had follow up CT since Dec 2013 pulmonology evaluation .  Will recommend repeating CT

## 2016-06-29 NOTE — Assessment & Plan Note (Signed)
He has been abstinent for 5 years.  Congratulated and encouraged to avoid situations that increase risk for relapse.

## 2016-06-29 NOTE — Assessment & Plan Note (Signed)
No serious arrhtyhmias by recent cardiology evaluation.

## 2016-06-29 NOTE — Assessment & Plan Note (Addendum)
Secondary to ulnar spurring.  Confirmed By EMG studies ordered by orthopedist. Continue celebrex and tramadol.

## 2016-06-29 NOTE — Assessment & Plan Note (Signed)
Aggravated by the unexpected death of his brother who apparently died of a massive MI after shovelling snow. Advised to reduce clonazepam to twice daily

## 2016-06-30 ENCOUNTER — Other Ambulatory Visit: Payer: BLUE CROSS/BLUE SHIELD

## 2016-07-02 ENCOUNTER — Other Ambulatory Visit: Payer: Self-pay | Admitting: Internal Medicine

## 2016-07-21 ENCOUNTER — Other Ambulatory Visit: Payer: Self-pay | Admitting: Internal Medicine

## 2016-07-22 ENCOUNTER — Encounter: Payer: Self-pay | Admitting: *Deleted

## 2016-07-26 ENCOUNTER — Encounter: Payer: Self-pay | Admitting: *Deleted

## 2016-07-26 ENCOUNTER — Encounter: Payer: Self-pay | Admitting: Family

## 2016-07-26 ENCOUNTER — Ambulatory Visit (INDEPENDENT_AMBULATORY_CARE_PROVIDER_SITE_OTHER): Payer: BLUE CROSS/BLUE SHIELD | Admitting: Family

## 2016-07-26 DIAGNOSIS — M25522 Pain in left elbow: Secondary | ICD-10-CM | POA: Diagnosis not present

## 2016-07-26 NOTE — Progress Notes (Signed)
Subjective:    Patient ID: Ronald Cordova, male    DOB: [birth year], 50 y.o.   MRN: [id number]  CC: Ronald Cordova is a 50 y.o. male who presents today for an acute visit.    HPI: CC: left arm pain for past week, waxing and waning. Left 4th and 5th are numb. Aggrevated when slept on left arm last night. Tramadol not working. Has seen orthopedics for carpal tunnel and had injection 3 weeks ago with some relief, pain recurred when sleep on it last night.   H/o of left injury Ronald Cordova injury 03/2016. Xrays negative for fracture  H/o CTS and release right wrist; h/o CTS left wrist     HISTORY:  Past Medical History:  Diagnosis Date  . Anxiety state, unspecified   . Eosinophilic esophagitis 5465   by EGD with  biopsies  . GERD (gastroesophageal reflux disease)    carafate added by Dr. Collene Cordova  . Hemorrhoids   . Irritable bowel syndrome   . Polyp of colon, hyperplastic 01/08/2011   Ronald Cordova, repeat due 2018  . Treadmill stress test negative for angina pectoris 2003   Past Surgical History:  Procedure Laterality Date  . APPENDECTOMY     done during high school  . carpal tunnel release  Sept 2008   R hand, by  Ronald Cordova, Ronald Cordova ARTHROSCOPY  2000   left  . NASAL SINUS SURGERY  1998   bilateral   Family History  Problem Relation Age of Onset  . Hyperlipidemia Sister   . Hyperlipidemia Brother   . Heart attack Brother 38  . BRCA 1/2 Maternal Aunt   . Hyperlipidemia Mother   . Hypertension Mother   . Hyperlipidemia Father   . Hypertension Father     Allergies: Penicillins Current Outpatient Prescriptions on File Prior to Visit  Medication Sig Dispense Refill  . aspirin 81 MG tablet Take 81 mg by mouth daily.    . celecoxib (CELEBREX) 200 MG capsule Take 1 capsule (200 mg total) by mouth 2 (two) times daily. 60 capsule 5  . clonazePAM (KLONOPIN) 1 MG tablet Take 1 tablet (1 mg total) by mouth 2 (two) times daily. 60 tablet 2  . cyclobenzaprine  (FLEXERIL) 10 MG tablet TAKE 1 TABLET BY MOUTH AT BEDTIME 30 tablet 4  . DEXILANT 60 MG capsule Take 1 capsule by mouth daily.    . fluticasone (VERAMYST) 27.5 MCG/SPRAY nasal spray Place 2 sprays into the nose daily. 30 g 3  . losartan-hydrochlorothiazide (HYZAAR) 50-12.5 MG tablet TAKE 1 TABLET BY MOUTH DAILY. 90 tablet 1  . montelukast (SINGULAIR) 10 MG tablet Take 10 mg by mouth at bedtime.    Marland Kitchen nystatin (MYCOSTATIN/NYSTOP) powder Apply topically 4 (four) times daily. 30 g 2  . rosuvastatin (CRESTOR) 5 MG tablet TAKE 1 TABLET BY MOUTH EVERY DAY 90 tablet 1  . traMADol (ULTRAM) 50 MG tablet TAKE 1 TABLET BY MOUTH EVERY EIGHT HOURS AS NEEDED 90 tablet 5   No current facility-administered medications on file prior to visit.     Social History  Substance Use Topics  . Smoking status: Former Smoker    Packs/day: 1.00    Years: 15.00    Types: Cigarettes    Quit date: 05/17/2011  . Smokeless tobacco: Never Used  . Alcohol use Yes     Comment: occasional    Review of Systems  Constitutional: Negative for chills and fever.  Respiratory: Negative  for cough.   Cardiovascular: Negative for chest pain and palpitations.  Gastrointestinal: Negative for nausea and vomiting.  Musculoskeletal: Negative for arthralgias and joint swelling.  Neurological: Positive for numbness.      Objective:    BP 116/72   Pulse 96   Temp 98 F (36.7 C) (Oral)   Ht '5\' 10"'  (1.778 m)   Wt 207 lb 6.4 oz (94.1 kg)   SpO2 98%   BMI 29.76 kg/m    Physical Exam  Constitutional: He appears well-developed and well-nourished.  Cardiovascular: Regular rhythm and normal heart sounds.   Palpable radial pulses bilaterally  Pulmonary/Chest: Effort normal and breath sounds normal. No respiratory distress. He has no wheezes. He has no rhonchi. He has no rales.  Musculoskeletal:       Left elbow: He exhibits normal range of motion, no swelling, no effusion and no deformity. Tenderness found. Lateral epicondyle  tenderness noted.  Pain at left elbow with wrist flexion with left arm extented  Neurological: He is alert.  Sensation light touch intact Negative tinels and phalen.  Skin: Skin is warm and dry.  Psychiatric: He has a normal mood and affect. His speech is normal and behavior is normal.  Vitals reviewed.      Assessment & Plan:   Problem List Items Addressed This Visit      Other   Elbow pain    Symptoms c/w lateral epicondylitis. Suspect ulnar nerve compression as well. Advised conservative therapy and education provided regarding sustained flexion ( e.g during sleep, driving) . Advised counter pressure brace for epicondylitis.             I am having [name] maintain his aspirin, fluticasone, DEXILANT, montelukast, cyclobenzaprine, rosuvastatin, celecoxib, traMADol, clonazePAM, nystatin, and losartan-hydrochlorothiazide.   No orders of the defined types were placed in this encounter.   Return precautions given.   Risks, benefits, and alternatives of the medications and treatment plan prescribed today were discussed, and patient expressed understanding.   Education regarding symptom management and diagnosis given to patient on AVS.  Continue to follow with Ronald Cordova, Ronald Everts, MD for routine health maintenance.   Ronald Cordova and I agreed with plan.   Ronald Paris, FNP

## 2016-07-26 NOTE — Assessment & Plan Note (Addendum)
Symptoms c/w lateral epicondylitis. Suspect ulnar nerve compression as well. Advised conservative therapy and education provided regarding sustained flexion ( e.g during sleep, driving) . Advised counter pressure brace for epicondylitis.

## 2016-07-26 NOTE — Patient Instructions (Addendum)
Suspect element of lateral epicondylitis, 'tennis elbow', as well as ulnar nerve compression.   Tennis elbow brace ( picture)  Try pillow brace to prevent exacerbation.   Trial of topical capsaicin for burning sensation  If there is no improvement in your symptoms, or if there is any worsening of symptoms, or if you have any additional concerns, please return for re-evaluation; or, if we are closed, consider going to the Emergency Room for evaluation if symptoms urgent.  Cubital Tunnel Syndrome Introduction Cubital tunnel syndrome is a condition that causes pain and weakness of the forearm and hand. This condition happens when one of the nerves (ulnar nerve) that runs alongside the elbow joint becomes irritated. What are the causes? Causes of this condition include:  Increased pressure on the ulnar nerve at the elbow, arm, or forearm. This can be caused by:  Swollen tissues.  Ligaments.  Muscles.  Poorly healed elbow fractures.  Tumors in the elbow. These are usually noncancerous (benign).  Scar tissue that develops in the elbow after an injury.  Bony growths (spurs) near the ulnar nerve.  Stretching of the nerve due to loose elbow ligaments.  Trauma to the nerve at the elbow.  Repetitive elbow bending.  Certain medical conditions. What increases the risk? This condition is more likely to develop in:  People who do manual labor that requires frequently bending the elbow.  People who play sports that include repeated or strenuous throwing motions, such as baseball.  People who play contact sports, such as football or lacrosse.  People who do not warm up properly before activities.  People who have diabetes.  People who have an underactive thyroid (hypothyroidism). What are the signs or symptoms? Symptoms of this condition include:  Clumsiness or weakness of the hand.  Tenderness of the inner elbow.  Aching or soreness of the inner elbow, forearm, or fingers,  especially the little finger or the ring finger.  Increased pain with forced elbow bending.  Reduced control when throwing.  Tingling, numbness, or burning inside the forearm, or in part of the hand or fingers, especially the little finger or the ring finger.  Sharp pains that shoot from the elbow down to the wrist and hand.  The inability to grip or pinch hard. How is this diagnosed? This condition is diagnosed with a medical history and physical exam. Your health care provider will ask about your symptoms and ask for details about any injury. You may also have other tests, including:  Electromyogram (EMG). This test checks how well the nerve is working.  X-ray. How is this treated? Treatment starts by stopping the activities that are causing your symptoms to get worse. Treatment may include the use of icing and medicines to reduce pain and swelling. You may also be advised to wear a splint to prevent your elbow from bending or wear an elbow pad where the ulnar nerve is closest to the skin. In less severe cases, treatment may also include working with a physical therapist:  To help decrease your symptoms.  To improve the strength and range of motion of your elbow, forearm, and hand. If the treatments described above do not help, surgery may be needed. Follow these instructions at home: If you have a splint:  Wear it as told by your health care provider. Remove it only as told by your health care provider.  Loosen the splint if your fingers become numb and tingle, or if they turn cold and blue.  Keep the splint clean  and dry. Managing pain, stiffness, and swelling  If directed, apply ice to the injured area:  Put ice in a plastic bag.  Place a towel between your skin and the bag.  Leave the ice on for 20 minutes, 2-3 times per day.  Move your fingers often to avoid stiffness and to lessen swelling.  Raise (elevate) the injured area above the level of your heart while you  are sitting or lying down. General instructions  Take over-the-counter and prescription medicines only as told by your health care provider.  Keep all follow-up visits as told by your health care provider. This is important.  Do any exercise or physical therapy as told by your health care provider.  Do not drive or operate heavy machinery while taking prescription pain medicine.  If you were given an elbow pad, wear it as told by your health care provider. Contact a health care provider if:  Your symptoms get worse.  Your symptoms do not get better with treatment.  Your have new pain.  Your hand on the injured side feels numb or cold. This information is not intended to replace advice given to you by your health care provider. Make sure you discuss any questions you have with your health care provider. Document Released: 08/02/2005 Document Revised: 01/08/2016 Document Reviewed: 10/09/2014  2017 Elsevier

## 2016-07-26 NOTE — Progress Notes (Signed)
Pre visit review using our clinic review tool, if applicable. No additional management support is needed unless otherwise documented below in the visit note. 

## 2016-07-28 NOTE — Telephone Encounter (Signed)
Unread mychart message mailed to patient 

## 2016-08-05 ENCOUNTER — Other Ambulatory Visit: Payer: Self-pay | Admitting: Internal Medicine

## 2016-08-13 ENCOUNTER — Telehealth: Payer: Self-pay | Admitting: Internal Medicine

## 2016-08-13 NOTE — Telephone Encounter (Signed)
Pt called c/o green phlegm/mucos, coughing,runny nose, pressure in nose, and chest congestion. He also took his temperature and he has a temperature of 100, along with possible night sweats. Please advise, thank you!  Call pt @ [phone]

## 2016-08-13 NOTE — Telephone Encounter (Signed)
Tried calling patient voicemail not setup  

## 2016-10-25 ENCOUNTER — Ambulatory Visit: Payer: BLUE CROSS/BLUE SHIELD | Admitting: Family Medicine

## 2016-11-10 DIAGNOSIS — L918 Other hypertrophic disorders of the skin: Secondary | ICD-10-CM | POA: Insufficient documentation

## 2016-12-07 ENCOUNTER — Other Ambulatory Visit: Payer: Self-pay | Admitting: Internal Medicine

## 2016-12-08 NOTE — Telephone Encounter (Signed)
PLEASE CALL IN THE REFILL   SINCE I AM OUT OF THE OFFICE.    NEEDS 6 MONTH FOLLOW UP SCHEDULED WITH ME.

## 2016-12-08 NOTE — Telephone Encounter (Signed)
Spoke with pharmacy and scheduled follow up appt. thanks

## 2016-12-08 NOTE — Telephone Encounter (Signed)
Last OV was Acute with another provider on 07/26/16, last OV with PCP was 06/28/16, last refill was 06/28/2016 #60 with 2 refills

## 2016-12-20 ENCOUNTER — Ambulatory Visit (INDEPENDENT_AMBULATORY_CARE_PROVIDER_SITE_OTHER): Payer: BLUE CROSS/BLUE SHIELD | Admitting: Internal Medicine

## 2016-12-20 ENCOUNTER — Encounter: Payer: Self-pay | Admitting: Internal Medicine

## 2016-12-20 VITALS — BP 116/80 | HR 90 | Temp 98.3°F | Wt 223.2 lb

## 2016-12-20 DIAGNOSIS — E559 Vitamin D deficiency, unspecified: Secondary | ICD-10-CM | POA: Diagnosis not present

## 2016-12-20 DIAGNOSIS — G2581 Restless legs syndrome: Secondary | ICD-10-CM

## 2016-12-20 DIAGNOSIS — F411 Generalized anxiety disorder: Secondary | ICD-10-CM

## 2016-12-20 DIAGNOSIS — M5137 Other intervertebral disc degeneration, lumbosacral region: Secondary | ICD-10-CM

## 2016-12-20 DIAGNOSIS — I10 Essential (primary) hypertension: Secondary | ICD-10-CM | POA: Diagnosis not present

## 2016-12-20 DIAGNOSIS — R5383 Other fatigue: Secondary | ICD-10-CM

## 2016-12-20 DIAGNOSIS — E785 Hyperlipidemia, unspecified: Secondary | ICD-10-CM | POA: Diagnosis not present

## 2016-12-20 DIAGNOSIS — E663 Overweight: Secondary | ICD-10-CM | POA: Diagnosis not present

## 2016-12-20 MED ORDER — DOCUSATE SODIUM 50 MG/5ML PO LIQD: 100.0000 mg | Freq: Every day | ORAL | 1 refills | Status: DC | PRN

## 2016-12-20 MED ORDER — CLONAZEPAM 0.5 MG PO TABS: 1.0000 mg | ORAL_TABLET | Freq: Two times a day (BID) | ORAL | 2 refills | Status: DC

## 2016-12-20 MED ORDER — BUSPIRONE HCL 7.5 MG PO TABS: ORAL_TABLET | ORAL | 2 refills | Status: DC

## 2016-12-20 NOTE — Progress Notes (Signed)
Pre visit review using our clinic review tool, if applicable. No additional management support is needed unless otherwise documented below in the visit note. 

## 2016-12-20 NOTE — Patient Instructions (Signed)
I  agree with reducing your clonazepam dose to 0.5 mg once or twice daily  I have prescribed an alternative medication called buspirone to take as needed for anxiety  Instead of escalating your dose   Celebrex is safe to use  daily for your joint and back/neck pain.  It can be combined with tylenol and tramadol if your pain is severe   I am checking your iron studies to make sure your restless legs is not due to iron deficiency  If not,  We can try a medication at night for RLS to help you sleep

## 2016-12-20 NOTE — Progress Notes (Signed)
Subjective:  Patient ID: Ronald Cordova, male    DOB: [birth year]  Age: 51 y.o. MRN: [id number]  CC: The primary encounter diagnosis was Restless leg syndrome. Diagnoses of Restless legs, Fatigue, unspecified type, Essential hypertension, Hyperlipidemia with target LDL less than 130, Vitamin D deficiency, Degeneration of lumbar or lumbosacral intervertebral disc, Overweight (BMI 25.0-29.9), and Anxiety state were also pertinent to this visit.  HPI Ronald BattenGregory S Cordova presents for 6 month follow up on hyperlipidemia  . GAD   He was Started on sildenafil by [facility] for low testosterone and low libido .Marland Kitchen. He has only recently started the medication   Weight gain of 18 lbs since last visit.  Not exercising , not following a diet .  Back and neck pain more bothersome . Not sleeping wel due ot restless legs.    Increased pressure in the right ear during flight to [place],  Has myringotomy tube , worried that it has become dislodged  Using clonazepam 1/2 tablet  An average of twice daily, still having a hard time managing his grief over the loss of his brother and ongoing family stressors and dysfunction due to his sister's decision to marry an ex minister who outed the patient's homosexual status.  He wants to reduce his dependence on clonazepam . discussed alternative medication while reducing dose .   Outpatient Medications Prior to Visit  Medication Sig Dispense Refill  . aspirin 81 MG tablet Take 81 mg by mouth daily.    . celecoxib (CELEBREX) 200 MG capsule Take 1 capsule (200 mg total) by mouth 2 (two) times daily. 60 capsule 5  . cyclobenzaprine (FLEXERIL) 10 MG tablet TAKE 1 TABLET BY MOUTH AT BEDTIME 30 tablet 4  . DEXILANT 60 MG capsule Take 1 capsule by mouth daily.    . fluticasone (VERAMYST) 27.5 MCG/SPRAY nasal spray Place 2 sprays into the nose daily. 30 g 3  . losartan-hydrochlorothiazide (HYZAAR) 50-12.5 MG tablet TAKE 1 TABLET BY MOUTH DAILY. 90 tablet 1  . montelukast (SINGULAIR) 10 MG tablet  Take 10 mg by mouth at bedtime.    Marland Kitchen. nystatin (MYCOSTATIN/NYSTOP) powder Apply topically 4 (four) times daily. 30 g 2  . rosuvastatin (CRESTOR) 5 MG tablet TAKE 1 TABLET BY MOUTH EVERY DAY 90 tablet 1  . traMADol (ULTRAM) 50 MG tablet TAKE 1 TABLET BY MOUTH EVERY EIGHT HOURS AS NEEDED 90 tablet 5  . clonazePAM (KLONOPIN) 1 MG tablet TAKE 1 TABLET TWICE DAILY 60 tablet 0   No facility-administered medications prior to visit.     Review of Systems;  Patient denies headache, fevers, malaise, unintentional weight loss, skin rash, eye pain, sinus congestion and sinus pain, sore throat, dysphagia,  hemoptysis , cough, dyspnea, wheezing, chest pain, palpitations, orthopnea, edema, abdominal pain, nausea, melena, diarrhea, constipation, flank pain, dysuria, hematuria, urinary  Frequency, nocturia, numbness, tingling, seizures,  Focal weakness, Loss of consciousness,  Tremor, insomnia, and suicidal ideation.      Objective:  BP 116/80   Pulse 90   Temp 98.3 F (36.8 C) (Oral)   Wt 223 lb 4 oz (101.3 kg)   SpO2 94%   BMI 32.03 kg/m   BP Readings from Last 3 Encounters:  12/20/16 116/80  07/26/16 116/72  06/28/16 116/78    Wt Readings from Last 3 Encounters:  12/20/16 223 lb 4 oz (101.3 kg)  07/26/16 207 lb 6.4 oz (94.1 kg)  06/28/16 206 lb 8 oz (93.7 kg)    General appearance: alert, cooperative and appears stated  age Ears:right TM occluded with cerumen,  Myringotomy tube present ut position not certain  Throat: lips, mucosa, and tongue normal; teeth and gums normal Neck: no adenopathy, no carotid bruit, supple, symmetrical, trachea midline and thyroid not enlarged, symmetric, no tenderness/mass/nodules Back: symmetric, no curvature. ROM normal. No CVA tenderness. Lungs: clear to auscultation bilaterally Heart: regular rate and rhythm, S1, S2 normal, no murmur, click, rub or gallop Abdomen: soft, non-tender; bowel sounds normal; no masses,  no organomegaly Pulses: 2+ and  symmetric Skin: Skin color, texture, turgor normal. No rashes or lesions Lymph nodes: Cervical, supraclavicular, and axillary nodes normal.  No results found for: HGBA1C  Lab Results  Component Value Date   CREATININE 1.00 12/20/2016   CREATININE 1.12 07/28/2015   CREATININE 0.97 12/06/2014    Lab Results  Component Value Date   WBC 9.0 12/20/2016   HGB 15.9 12/20/2016   HCT 47.5 12/20/2016   PLT 295.0 12/20/2016   GLUCOSE 96 12/20/2016   CHOL 167 12/20/2016   TRIG 153.0 (H) 12/20/2016   HDL 36.50 (L) 12/20/2016   LDLDIRECT 146.0 07/28/2015   LDLCALC 100 (H) 12/20/2016   ALT 43 12/20/2016   AST 22 12/20/2016   NA 141 12/20/2016   K 4.0 12/20/2016   CL 104 12/20/2016   CREATININE 1.00 12/20/2016   BUN 14 12/20/2016   CO2 28 12/20/2016   TSH 0.76 12/20/2016    No results found.  Assessment & Plan:   Problem List Items Addressed This Visit    Anxiety state    Recommend trial of buspirone while reducing clonazepam       Relevant Medications   busPIRone (BUSPAR) 7.5 MG tablet   Degeneration of lumbar or lumbosacral intervertebral disc    celebrex and tylenol prescribed.       Essential hypertension   Relevant Medications   sildenafil (REVATIO) 20 MG tablet   Hyperlipidemia with target LDL less than 130   Relevant Medications   sildenafil (REVATIO) 20 MG tablet   Overweight (BMI 25.0-29.9)    I have addressed  BMI and recommended a low glycemic index diet utilizing smaller more frequent meals to increase metabolism.  I have also recommended that patient start exercising with a goal of 30 minutes of aerobic exercise a minimum of 5 days per week.         Restless legs    Checking iron studies, which are normal.  Will recommend trial of requip.   Lab Results  Component Value Date   FERRITIN 33.6 12/20/2016   Lab Results  Component Value Date   IRON 96 12/20/2016   TIBC 416 12/20/2016   FERRITIN 33.6 12/20/2016          Other Visit Diagnoses     Restless leg syndrome    -  Primary   Relevant Orders   Iron and TIBC (Completed)   Ferritin (Completed)   Fatigue, unspecified type       Relevant Orders   TSH (Completed)   CBC with Differential/Platelet (Completed)   Vitamin D deficiency          I have changed Ronald Cordova clonazePAM. I am also having him start on docusate and busPIRone. Additionally, I am having him maintain his aspirin, fluticasone, DEXILANT, montelukast, cyclobenzaprine, celecoxib, traMADol, nystatin, losartan-hydrochlorothiazide, rosuvastatin, and sildenafil.  Meds ordered this encounter  Medications  . sildenafil (REVATIO) 20 MG tablet    Sig: 3-5 po tablets daily as needed  . docusate (COLACE) 50 MG/5ML liquid  Sig: Take 10 mLs (100 mg total) by mouth daily as needed for mild constipation.    Dispense:  25 mL    Refill:  1  . clonazePAM (KLONOPIN) 0.5 MG tablet    Sig: Take 2 tablets (1 mg total) by mouth 2 (two) times daily.    Dispense:  60 tablet    Refill:  2    May refill on or after May 25  . busPIRone (BUSPAR) 7.5 MG tablet    Sig: 1 tablet 2 to 3 times daily as needed for anxiety    Dispense:  90 tablet    Refill:  2    Medications Discontinued During This Encounter  Medication Reason  . clonazePAM (KLONOPIN) 1 MG tablet Reorder    Follow-up: No Follow-up on file.   Sherlene Shams, MD

## 2016-12-21 DIAGNOSIS — G2581 Restless legs syndrome: Secondary | ICD-10-CM | POA: Insufficient documentation

## 2016-12-21 LAB — IRON AND TIBC
%SAT: 23 % (ref 15–60)
Iron: 96 ug/dL (ref 50–180)
TIBC: 416 ug/dL (ref 250–425)
UIBC: 320 ug/dL (ref 125–400)

## 2016-12-21 LAB — COMPREHENSIVE METABOLIC PANEL
ALK PHOS: 50 U/L (ref 39–117)
ALT: 43 U/L (ref 0–53)
AST: 22 U/L (ref 0–37)
Albumin: 4.8 g/dL (ref 3.5–5.2)
BUN: 14 mg/dL (ref 6–23)
CHLORIDE: 104 meq/L (ref 96–112)
CO2: 28 mEq/L (ref 19–32)
CREATININE: 1 mg/dL (ref 0.40–1.50)
Calcium: 10.1 mg/dL (ref 8.4–10.5)
GFR: 83.99 mL/min (ref >60.00)
GLUCOSE: 96 mg/dL (ref 70–99)
Potassium: 4 mEq/L (ref 3.5–5.1)
SODIUM: 141 meq/L (ref 135–145)
TOTAL PROTEIN: 7.3 g/dL (ref 6.0–8.3)
Total Bilirubin: 0.7 mg/dL (ref 0.2–1.2)

## 2016-12-21 LAB — CBC WITH DIFFERENTIAL/PLATELET
Basophils Absolute: 0.1 10*3/uL (ref 0.0–0.1)
Basophils Relative: 0.9 % (ref 0.0–3.0)
EOS ABS: 0.3 10*3/uL (ref 0.0–0.7)
Eosinophils Relative: 3.6 % (ref 0.0–5.0)
HCT: 47.5 % (ref 39.0–52.0)
HEMOGLOBIN: 15.9 g/dL (ref 13.0–17.0)
Lymphocytes Relative: 38.5 % (ref 12.0–46.0)
Lymphs Abs: 3.5 10*3/uL (ref 0.7–4.0)
MCHC: 33.5 g/dL (ref 30.0–36.0)
MCV: 91.7 fl (ref 78.0–100.0)
MONO ABS: 0.6 10*3/uL (ref 0.1–1.0)
Monocytes Relative: 6.4 % (ref 3.0–12.0)
NEUTROS PCT: 50.6 % (ref 43.0–77.0)
Neutro Abs: 4.6 10*3/uL (ref 1.4–7.7)
Platelets: 295 10*3/uL (ref 150.0–400.0)
RBC: 5.17 Mil/uL (ref 4.22–5.81)
RDW: 13.7 % (ref 11.5–15.5)
WBC: 9 10*3/uL (ref 4.0–10.5)

## 2016-12-21 LAB — LIPID PANEL
CHOLESTEROL: 167 mg/dL (ref 0–200)
HDL: 36.5 mg/dL — ABNORMAL LOW (ref >39.00)
LDL CALC: 100 mg/dL — AB (ref 0–99)
NONHDL: 130.13
Total CHOL/HDL Ratio: 5
Triglycerides: 153 mg/dL — ABNORMAL HIGH (ref 0.0–149.0)
VLDL: 30.6 mg/dL (ref 0.0–40.0)

## 2016-12-21 LAB — VITAMIN D 25 HYDROXY (VIT D DEFICIENCY, FRACTURES): VITD: 22.94 ng/mL — ABNORMAL LOW (ref 30.00–100.00)

## 2016-12-21 LAB — TSH: TSH: 0.76 u[IU]/mL (ref 0.35–4.50)

## 2016-12-21 LAB — FERRITIN: FERRITIN: 33.6 ng/mL (ref 22.0–322.0)

## 2016-12-21 NOTE — Assessment & Plan Note (Signed)
I have addressed  BMI and recommended a low glycemic index diet utilizing smaller more frequent meals to increase metabolism.  I have also recommended that patient start exercising with a goal of 30 minutes of aerobic exercise a minimum of 5 days per week.  

## 2016-12-21 NOTE — Assessment & Plan Note (Signed)
Recommend trial of buspirone while reducing clonazepam  

## 2016-12-21 NOTE — Assessment & Plan Note (Signed)
celebrex and tylenol prescribed.

## 2016-12-21 NOTE — Assessment & Plan Note (Addendum)
Checking iron studies, which are normal.  Will recommend trial of requip.   Lab Results  Component Value Date   FERRITIN 33.6 12/20/2016   Lab Results  Component Value Date   IRON 96 12/20/2016   TIBC 416 12/20/2016   FERRITIN 33.6 12/20/2016

## 2016-12-23 ENCOUNTER — Encounter: Payer: Self-pay | Admitting: Internal Medicine

## 2016-12-23 ENCOUNTER — Other Ambulatory Visit: Payer: Self-pay | Admitting: Internal Medicine

## 2016-12-23 DIAGNOSIS — E559 Vitamin D deficiency, unspecified: Secondary | ICD-10-CM | POA: Insufficient documentation

## 2016-12-23 MED ORDER — ERGOCALCIFEROL 1.25 MG (50000 UT) PO CAPS: 50000.0000 [IU] | ORAL_CAPSULE | ORAL | 0 refills | Status: DC

## 2017-01-28 ENCOUNTER — Other Ambulatory Visit: Payer: Self-pay | Admitting: Internal Medicine

## 2017-02-25 ENCOUNTER — Telehealth: Payer: Self-pay | Admitting: Internal Medicine

## 2017-02-25 NOTE — Telephone Encounter (Signed)
Spoke with the patient, he is a [employer] architecttruck driver and has had a UTI in the past, was treated in our clinic.  I explained that Dr. Darrick Huntsmanullo was not in the office, and he stated that he made an appt for Monday and that he would wait, I advised that if his symptoms got worse to seek medical advice, offered urgent cares in the area and facilities that [facility] has.  He agreed and will let us know Monday if he needs the appt still. He is using Azo and increasing his fluid intake and drinking cranberry juice.

## 2017-02-25 NOTE — Telephone Encounter (Signed)
Pt lm on office vm. He thinks he has a UTI. Please call him at [phone], he wants to see Dr. Darrick Huntsmanullo.

## 2017-02-28 ENCOUNTER — Ambulatory Visit (INDEPENDENT_AMBULATORY_CARE_PROVIDER_SITE_OTHER): Payer: BLUE CROSS/BLUE SHIELD | Admitting: Podiatry

## 2017-02-28 ENCOUNTER — Encounter: Payer: Self-pay | Admitting: Family

## 2017-02-28 ENCOUNTER — Ambulatory Visit (INDEPENDENT_AMBULATORY_CARE_PROVIDER_SITE_OTHER): Payer: BLUE CROSS/BLUE SHIELD | Admitting: Family

## 2017-02-28 ENCOUNTER — Encounter: Payer: Self-pay | Admitting: Podiatry

## 2017-02-28 VITALS — BP 134/74 | HR 98 | Temp 97.9°F | Ht 70.0 in | Wt 224.6 lb

## 2017-02-28 VITALS — BP 122/74 | HR 83 | Resp 16

## 2017-02-28 DIAGNOSIS — L603 Nail dystrophy: Secondary | ICD-10-CM | POA: Diagnosis not present

## 2017-02-28 DIAGNOSIS — R1031 Right lower quadrant pain: Secondary | ICD-10-CM | POA: Diagnosis not present

## 2017-02-28 DIAGNOSIS — N400 Enlarged prostate without lower urinary tract symptoms: Secondary | ICD-10-CM

## 2017-02-28 LAB — URINALYSIS, ROUTINE W REFLEX MICROSCOPIC
BILIRUBIN URINE: NEGATIVE
HGB URINE DIPSTICK: NEGATIVE
Ketones, ur: NEGATIVE
LEUKOCYTES UA: NEGATIVE
NITRITE: NEGATIVE
RBC / HPF: NONE SEEN (ref 0–?)
Specific Gravity, Urine: 1.02 (ref 1.000–1.030)
TOTAL PROTEIN, URINE-UPE24: NEGATIVE
UROBILINOGEN UA: 0.2 (ref 0.0–1.0)
Urine Glucose: NEGATIVE
WBC UA: NONE SEEN (ref 0–?)
pH: 6 (ref 5.0–8.0)

## 2017-02-28 LAB — PSA: PSA: 0.69 ng/mL (ref 0.10–4.00)

## 2017-02-28 MED ORDER — NYSTATIN 100000 UNIT/GM EX OINT: 1.0000 "application " | TOPICAL_OINTMENT | Freq: Two times a day (BID) | CUTANEOUS | 1 refills | Status: DC

## 2017-02-28 MED ORDER — CIPROFLOXACIN HCL 500 MG PO TABS: 500.0000 mg | ORAL_TABLET | Freq: Two times a day (BID) | ORAL | 0 refills | Status: DC

## 2017-02-28 NOTE — Progress Notes (Signed)
   Subjective:    Patient ID: Ronald Cordova, male    DOB: 04-20-1966, 51 y.o.   MRN: [id number]  HPI: He presents today chief concern of a discoloration to the distal central nail plate second digit left foot. States that seems to be getting lighter and looking some better. He's concerned that he may be fungus on his father has.    Review of Systems  All other systems reviewed and are negative.      Objective:   Physical Exam: Vital signs are stable he is alert and oriented 3. Pulses are strongly palpable few small venous varicosities around the medial ankle no lesions or wounds no signs of inflammation or edema. Nail plate second digit left foot does demonstrate what appears to have been a subungual hematoma that appears to be growing out. There is a line of delineation across the nail leading the edge of the hematoma. I feel this will go ahead and grow out 100%.        Assessment & Plan:  Subungual hematoma second digital nail plate left foot.  Plan: Follow up with me as needed.

## 2017-02-28 NOTE — Progress Notes (Signed)
Pre visit review using our clinic review tool, if applicable. No additional management support is needed unless otherwise documented below in the visit note. 

## 2017-02-28 NOTE — Patient Instructions (Addendum)
We will go ahead and start Ciprofloxacin as we discussed today. Please stay very very vigilant regarding abdominal discomfort, pressure as I do have concerns that could be something abdominal etiology accompanying  prostatitis, such as diverticulitis which we discussed.  Antibodies diverticulitis are generally up to 6 weeks. I have given you a  prescription for 2 weeks at this time.   I would like to follow-up with your PCP or myself  So we can decide if clinically we need to continue to extend them to ensure adequate treatment.   Prostatitis Prostatitis is swelling or inflammation of the prostate gland. The prostate is a walnut-sized gland that is involved in the production of semen. It is located below a man's bladder, in front of the rectum. There are four types of prostatitis:  Chronic nonbacterial prostatitis. This is the most common type of prostatitis. It may be associated with a viral infection or autoimmune disorder.  Acute bacterial prostatitis. This is the least common type of prostatitis. It starts quickly and is usually associated with a bladder infection, high fever, and shaking chills. It can occur at any age.  Chronic bacterial prostatitis. This type usually results from acute bacterial prostatitis that happens repeatedly (is recurrent) or has not been treated properly. It can occur in men of any age but is most common among middle-aged men whose prostate has begun to get larger. The symptoms are not as severe as symptoms caused by acute bacterial prostatitis.  Prostatodynia or chronic pelvic pain syndrome (CPPS). This type is also called pelvic floor disorder. It is associated with increased muscular tone in the pelvis surrounding the prostate.  What are the causes? Bacterial prostatitis is caused by infection from bacteria. Chronic nonbacterial prostatitis may be caused by:  Urinary tract infections (UTIs).  Nerve damage.  A response by the body's disease-fighting system  (autoimmune response).  Chemicals in the urine.  The causes of the other types of prostatitis are usually not known. What are the signs or symptoms? Symptoms of this condition vary depending upon the type of prostatitis. If you have acute bacterial prostatitis, you may experience:  Urinary symptoms, such as: ? Painful urination. ? Burning during urination. ? Frequent and sudden urges to urinate. ? Inability to start urinating. ? A weak or interrupted stream of urine.  Vomiting.  Nausea.  Fever.  Chills.  Inability to empty the bladder completely.  Pain in the: ? Muscles or joints. ? Lower back. ? Lower abdomen.  If you have any of the other types of prostatitis, you may experience:  Urinary symptoms, such as: ? Sudden urges to urinate. ? Frequent urination. ? Difficulty starting urination. ? Weak urine stream. ? Dribbling after urination.  Discharge from the urethra. The urethra is a tube that opens at the end of the penis.  Pain in the: ? Testicles. ? Penis or tip of the penis. ? Rectum. ? Area in front of the rectum and below the scrotum (perineum).  Problems with sexual function.  Painful ejaculation.  Bloody semen.  How is this diagnosed? This condition may be diagnosed based on:  A physical and medical exam.  Your symptoms.  A urine test to check for bacteria.  An exam in which a health care provider uses a finger to feel the prostate (digital rectal exam).  A test of a sample of semen.  Blood tests.  Ultrasound.  Removal of prostate tissue to be examined under a microscope (biopsy).  Tests to check how your body handles  urine (urodynamic tests).  A test to look inside your bladder or urethra (cystoscopy).  How is this treated? Treatment for this condition depends on the type of prostatitis. Treatment may involve:  Medicines to relieve pain or inflammation.  Medicines to help relax your muscles.  Physical therapy.  Heat  therapy.  Techniques to help you control certain body functions (biofeedback).  Relaxation exercises.  Antibiotic medicine, if your condition is caused by bacteria.  Warm water baths (sitz baths). Sitz baths help with relaxing your pelvic floor muscles, which helps to relieve pressure on the prostate.  Follow these instructions at home:  Take over-the-counter and prescription medicines only as told by your health care provider.  If you were prescribed an antibiotic, take it as told by your health care provider. Do not stop taking the antibiotic even if you start to feel better.  If physical therapy, biofeedback, or relaxation exercises were prescribed, do exercises as instructed.  Take sitz baths as directed by your health care provider. For a sitz bath, sit in warm water that is deep enough to cover your hips and buttocks.  Keep all follow-up visits as told by your health care provider. This is important. Contact a health care provider if:  Your symptoms get worse.  You have a fever. Get help right away if:  You have chills.  You feel nauseous.  You vomit.  You feel light-headed or feel like you are going to faint.  You are unable to urinate.  You have blood or blood clots in your urine. This information is not intended to replace advice given to you by your health care provider. Make sure you discuss any questions you have with your health care provider. Document Released: 07/30/2000 Document Revised: 04/22/2016 Document Reviewed: 04/22/2016 Elsevier Interactive Patient Education  2017 [employer]

## 2017-02-28 NOTE — Progress Notes (Signed)
Subjective:    Patient ID: Ronald Cordova, male    DOB: [birth year], 51 y.o.   MRN: [id number]  CC: Ronald Cordova is a 51 y.o. male who presents today for an acute visit.    HPI: Patient complains of pelvic discomfort and suprapubic pressure, 4 days, improved. Pressure, 'tightness' radiated towards to rectum. He is concerned for prostatitis. Endorses urinary frequency, 'slow' urinating.  Started drinking cranberry juice, water which he thinks may be helping.  No fever, chills, hematuria, blood in BM, diarrhea, nausea, vomiting.  Works as Administrator No concern for STDs No recent UTI  NO h/o diverticulitis.   Reports colonoscopy at 60 years , 'normal' per patient.   History of appendectomy    HISTORY:  Past Medical History:  Diagnosis Date  . Anxiety state, unspecified   . Eosinophilic esophagitis 4034   by EGD with  biopsies  . GERD (gastroesophageal reflux disease)    carafate added by Dr. Collene Cordova  . Hemorrhoids   . Irritable bowel syndrome   . Polyp of colon, hyperplastic 01/08/2011   Ronald Cordova, repeat due 2018  . Treadmill stress test negative for angina pectoris 2003   Past Surgical History:  Procedure Laterality Date  . APPENDECTOMY     done during high school  . carpal tunnel release  Sept 2008   R hand, by  Ronald Cordova, Ronald Cordova ARTHROSCOPY  2000   left  . NASAL SINUS SURGERY  1998   bilateral   Family History  Problem Relation Age of Onset  . Hyperlipidemia Sister   . Hyperlipidemia Brother   . Heart attack Brother 91  . BRCA 1/2 Maternal Aunt   . Hyperlipidemia Mother   . Hypertension Mother   . Hyperlipidemia Father   . Hypertension Father     Allergies: Penicillins Current Outpatient Prescriptions on File Prior to Visit  Medication Sig Dispense Refill  . aspirin 81 MG tablet Take 81 mg by mouth daily.    . busPIRone (BUSPAR) 7.5 MG tablet 1 tablet 2 to 3 times daily as needed for anxiety 90 tablet 2  . celecoxib  (CELEBREX) 200 MG capsule Take 1 capsule (200 mg total) by mouth 2 (two) times daily. 60 capsule 5  . clonazePAM (KLONOPIN) 0.5 MG tablet Take 2 tablets (1 mg total) by mouth 2 (two) times daily. 60 tablet 2  . cyclobenzaprine (FLEXERIL) 10 MG tablet TAKE 1 TABLET BY MOUTH AT BEDTIME 30 tablet 4  . DEXILANT 60 MG capsule Take 1 capsule by mouth daily.    Marland Kitchen docusate (COLACE) 50 MG/5ML liquid Take 10 mLs (100 mg total) by mouth daily as needed for mild constipation. 25 mL 1  . ergocalciferol (DRISDOL) 50000 units capsule Take 1 capsule (50,000 Units total) by mouth once a week. 4 capsule 0  . fluticasone (VERAMYST) 27.5 MCG/SPRAY nasal spray Place 2 sprays into the nose daily. 30 g 3  . losartan-hydrochlorothiazide (HYZAAR) 50-12.5 MG tablet TAKE 1 TABLET BY MOUTH DAILY. 90 tablet 1  . montelukast (SINGULAIR) 10 MG tablet Take 10 mg by mouth at bedtime.    Marland Kitchen nystatin (MYCOSTATIN/NYSTOP) powder Apply topically 4 (four) times daily. 30 g 2  . rosuvastatin (CRESTOR) 5 MG tablet TAKE 1 TABLET BY MOUTH EVERY DAY 90 tablet 1  . sildenafil (REVATIO) 20 MG tablet 3-5 po tablets daily as needed    . traMADol (ULTRAM) 50 MG tablet TAKE 1 TABLET BY MOUTH EVERY  EIGHT HOURS AS NEEDED 90 tablet 5   No current facility-administered medications on file prior to visit.     Social History  Substance Use Topics  . Smoking status: Former Smoker    Packs/day: 1.00    Years: 15.00    Types: Cigarettes    Quit date: 05/17/2011  . Smokeless tobacco: Never Used  . Alcohol use Yes     Comment: occasional    Review of Systems  Constitutional: Negative for chills and fever.  Respiratory: Negative for cough.   Cardiovascular: Negative for chest pain and palpitations.  Gastrointestinal: Positive for abdominal pain. Negative for abdominal distention, anal bleeding, blood in stool, constipation, diarrhea, nausea and vomiting.  Genitourinary: Positive for difficulty urinating. Negative for dysuria, hematuria, penile  pain, penile swelling, scrotal swelling and testicular pain.      Objective:    BP 134/74   Pulse 98   Temp 97.9 F (36.6 C) (Oral)   Ht 5' 10" (1.778 m)   Wt 224 lb 9.6 oz (101.9 kg)   SpO2 98%   BMI 32.23 kg/m    Physical Exam  Constitutional: He appears well-developed and well-nourished.  Cardiovascular: Regular rhythm and normal heart sounds.   Pulmonary/Chest: Effort normal and breath sounds normal. No respiratory distress. He has no wheezes. He has no rales.  Abdominal: Soft. Normal appearance and bowel sounds are normal. He exhibits no distension and no mass. There is tenderness in the right lower quadrant. There is no rigidity, no rebound, no guarding and no CVA tenderness.  mild tenderness noted over right lower quadrant.  Genitourinary: Rectum normal and prostate normal. Rectal exam shows no external hemorrhoid, no mass and no tenderness. Prostate is not enlarged and not tender.  Genitourinary Comments: No masses or nodules appreciated during DRE lobes of prostate. Prostate did feel symmetrically enlarged. Prostate tender, not boggy or asymmetric.   Neurological: He is alert.  Skin: Skin is warm and dry.  Psychiatric: He has a normal mood and affect. His speech is normal and behavior is normal.  Vitals reviewed.      Assessment & Plan:  1. Right lower quadrant abdominal pain Working diagnosis of prostatitis. Pending urine studies, PSA. Patient did have mild tenderness right lower quadrant on exam. I advised patient have a CT abdomen for concern of also diverticulitis. Patient politely declined and would like to start antibiotics for presumed prostatitis at this time. Return precautions given and advised very close follow-up to patient  - Urinalysis, Routine w reflex microscopic     I am having Mr. Ronald Cordova maintain his aspirin, fluticasone, DEXILANT, montelukast, cyclobenzaprine, celecoxib, traMADol, nystatin, rosuvastatin, sildenafil, docusate, clonazePAM, busPIRone,  ergocalciferol, and losartan-hydrochlorothiazide.   No orders of the defined types were placed in this encounter.   Return precautions given.   Risks, benefits, and alternatives of the medications and treatment plan prescribed today were discussed, and patient expressed understanding.   Education regarding symptom management and diagnosis given to patient on AVS.  Continue to follow with Crecencio Mc, MD for routine health maintenance.   [name] and I agreed with plan.   Mable Paris, FNP

## 2017-02-28 NOTE — Addendum Note (Signed)
Addended byElise Benne: Nea Gittens T on: 02/28/2017 02:04 PM   Modules accepted: Orders

## 2017-03-01 LAB — URINE CULTURE: ORGANISM ID, BACTERIA: NO GROWTH

## 2017-03-03 ENCOUNTER — Emergency Department: Payer: BLUE CROSS/BLUE SHIELD

## 2017-03-03 ENCOUNTER — Telehealth: Payer: Self-pay | Admitting: Internal Medicine

## 2017-03-03 ENCOUNTER — Encounter: Payer: Self-pay | Admitting: Emergency Medicine

## 2017-03-03 ENCOUNTER — Emergency Department
Admission: EM | Admit: 2017-03-03 | Discharge: 2017-03-03 | Disposition: A | Discharge status: Home / Self Care | Payer: BLUE CROSS/BLUE SHIELD | Attending: Emergency Medicine | Admitting: Emergency Medicine

## 2017-03-03 DIAGNOSIS — R109 Unspecified abdominal pain: Secondary | ICD-10-CM | POA: Diagnosis not present

## 2017-03-03 DIAGNOSIS — Z87891 Personal history of nicotine dependence: Secondary | ICD-10-CM | POA: Diagnosis not present

## 2017-03-03 DIAGNOSIS — R079 Chest pain, unspecified: Secondary | ICD-10-CM | POA: Diagnosis present

## 2017-03-03 DIAGNOSIS — I10 Essential (primary) hypertension: Secondary | ICD-10-CM | POA: Insufficient documentation

## 2017-03-03 DIAGNOSIS — R0789 Other chest pain: Secondary | ICD-10-CM | POA: Diagnosis not present

## 2017-03-03 LAB — CBC
HCT: 47.1 % (ref 40.0–52.0)
Hemoglobin: 16.1 g/dL (ref 13.0–18.0)
MCH: 30.5 pg (ref 26.0–34.0)
MCHC: 34.1 g/dL (ref 32.0–36.0)
MCV: 89.4 fL (ref 80.0–100.0)
PLATELETS: 270 10*3/uL (ref 150–440)
RBC: 5.27 MIL/uL (ref 4.40–5.90)
RDW: 13.3 % (ref 11.5–14.5)
WBC: 7.6 10*3/uL (ref 3.8–10.6)

## 2017-03-03 LAB — URINALYSIS, COMPLETE (UACMP) WITH MICROSCOPIC
Bacteria, UA: NONE SEEN
Bilirubin Urine: NEGATIVE
GLUCOSE, UA: NEGATIVE mg/dL
Hgb urine dipstick: NEGATIVE
Ketones, ur: NEGATIVE mg/dL
Leukocytes, UA: NEGATIVE
Nitrite: NEGATIVE
PH: 6 (ref 5.0–8.0)
Protein, ur: NEGATIVE mg/dL
RBC / HPF: NONE SEEN RBC/hpf (ref 0–5)
SPECIFIC GRAVITY, URINE: 1.008 (ref 1.005–1.030)
SQUAMOUS EPITHELIAL / LPF: NONE SEEN
WBC, UA: NONE SEEN WBC/hpf (ref 0–5)

## 2017-03-03 LAB — COMPREHENSIVE METABOLIC PANEL
ALK PHOS: 55 U/L (ref 38–126)
ALT: 40 U/L (ref 17–63)
AST: 27 U/L (ref 15–41)
Albumin: 4.6 g/dL (ref 3.5–5.0)
Anion gap: 9 (ref 5–15)
BUN: 13 mg/dL (ref 6–20)
CALCIUM: 9.9 mg/dL (ref 8.9–10.3)
CO2: 26 mmol/L (ref 22–32)
CREATININE: 0.98 mg/dL (ref 0.61–1.24)
Chloride: 106 mmol/L (ref 101–111)
GFR calc non Af Amer: >60 mL/min (ref >60)
GLUCOSE: 95 mg/dL (ref 65–99)
Potassium: 3.7 mmol/L (ref 3.5–5.1)
SODIUM: 141 mmol/L (ref 135–145)
Total Bilirubin: 1 mg/dL (ref 0.3–1.2)
Total Protein: 7.6 g/dL (ref 6.5–8.1)

## 2017-03-03 LAB — TROPONIN I: Troponin I: <0.03 ng/mL (ref <0.03)

## 2017-03-03 LAB — LIPASE, BLOOD: Lipase: 28 U/L (ref 11–51)

## 2017-03-03 NOTE — Telephone Encounter (Signed)
Patient advised to go ER to be evaluated for chest pain per Claris CheMargaret he agreed to go to be  evaluated.  Advised to call 911 and not drive.  He agreed to comply.

## 2017-03-03 NOTE — Telephone Encounter (Signed)
Call pt  I agree- he needs to be seen in ED.   VERY concerned for chest pain   Let me know he will comply

## 2017-03-03 NOTE — ED Triage Notes (Signed)
Pt reports some abdominal tenderness since last Wednesday. Pt was seen at [facility] on Monday but discomfort still there. Pt reports this am had some pressure like CP with some light headedness. Pt called [facility] who advised him to come to the ED for evaluation. Pt reports light pain at this time.

## 2017-03-03 NOTE — ED Provider Notes (Signed)
[facility] Emergency Department Provider Note       Time seen: ----------------------------------------- 1:13 PM on 03/03/2017 -----------------------------------------     I have reviewed the triage vital signs and the nursing notes.   HISTORY   Chief Complaint Chest Pain    HPI Ronald Cordova is a 51 y.o. male who presents to the ED for abdominal tenderness since last Wednesday. Patient was seen in his primary care doctor's office on Monday but discomfort persists particularly in the right flank. Patient reports this morning he had some pressure-like chest pain and lightheadedness and was encouraged comes to ER for evaluation. Discomfort is 1 out of 10 at this point.   Past Medical History:  Diagnosis Date  . Anxiety state, unspecified   . Eosinophilic esophagitis 2010   by EGD with  biopsies  . GERD (gastroesophageal reflux disease)    carafate added by Dr. Loreta AveMann  . Hemorrhoids   . Irritable bowel syndrome   . Polyp of colon, hyperplastic 01/08/2011   Charna ElizabethJyothi Mann, repeat due 2018  . Treadmill stress test negative for angina pectoris 2003    Patient Active Problem List   Diagnosis Date Noted  . Vitamin D deficiency 12/23/2016  . Restless legs 12/21/2016  . Ulnar neuropathy at elbow of left upper extremity 06/29/2016  . Elbow pain 03/19/2016  . Family history of sudden death in brother 12/02/2015  . Polyarthritis 12/02/2015  . Chronic bilateral thoracic back pain 07/29/2015  . Palpitations 07/29/2015  . Essential hypertension 11/13/2014  . Major depressive disorder, recurrent episode (HCC) 11/03/2014  . Degeneration of lumbar or lumbosacral intervertebral disc 01/14/2014  . Overweight (BMI 25.0-29.9) 08/28/2012  . Incidental pulmonary nodule, > 3mm and < 8mm 02/21/2012  . 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 Isolated or specific phobia 01/21/2012  . Chest pain on respiration 01/21/2012  . Hyperlipidemia with target LDL less than  130 05/09/2011  . Treadmill stress test negative for angina pectoris   . Anxiety state   . Irritable bowel syndrome   . Hemorrhoids     Past Surgical History:  Procedure Laterality Date  . APPENDECTOMY     done during high school  . carpal tunnel release  Sept 2008   R hand, by  [place], [place]   . JOINT REPLACEMENT    . KNEE ARTHROSCOPY  2000   left  . NASAL SINUS SURGERY  1998   bilateral    Allergies Penicillins  Social History Social History  Substance Use Topics  . Smoking status: Former Smoker    Packs/day: 1.00    Years: 15.00    Types: Cigarettes    Quit date: 05/17/2011  . Smokeless tobacco: Never Used  . Alcohol use Yes     Comment: occasional    Review of Systems Constitutional: Negative for fever. Eyes: Negative for vision changes ENT:  Negative for congestion, sore throat Cardiovascular: Positive for chest pain Respiratory: Negative for shortness of breath. Gastrointestinal: Positive for abdominal pain Genitourinary: Negative for dysuria. Musculoskeletal: Negative for back pain. Skin: Negative for rash. Neurological: Negative for headaches, focal weakness or numbness.  All systems negative/normal/unremarkable except as stated in the HPI  ____________________________________________   PHYSICAL EXAM:  VITAL SIGNS: ED Triage Vitals  Enc Vitals Group     BP 03/03/17 1010 129/71     Pulse Rate 03/03/17 1010 76     Resp 03/03/17 1010 20     Temp 03/03/17 1010 98.1 F (36.7 C)  Temp Source 03/03/17 1010 Oral     SpO2 03/03/17 1010 98 %     Weight 03/03/17 1004 223 lb (101.2 kg)     Height 03/03/17 1004 5\' 10"  (1.778 m)     Head Circumference --      Peak Flow --      Pain Score 03/03/17 1004 1     Pain Loc --      Pain Edu? --      Excl. in GC? --     Constitutional: Alert and oriented. Well appearing and in no distress. Eyes: Conjunctivae are normal. Normal extraocular movements. ENT   Head: Normocephalic and atraumatic.    Nose: No congestion/rhinnorhea.   Mouth/Throat: Mucous membranes are moist.   Neck: No stridor. Cardiovascular: Normal rate, regular rhythm. No murmurs, rubs, or gallops. Respiratory: Normal respiratory effort without tachypnea nor retractions. Breath sounds are clear and equal bilaterally. No wheezes/rales/rhonchi. Gastrointestinal: mild right flank tenderness, no rebound or gaurding Musculoskeletal: Nontender with normal range of motion in extremities. No lower extremity tenderness nor edema. Neurologic:  Normal speech and language. No gross focal neurologic deficits are appreciated.  Skin:  Skin is warm, dry and intact. No rash noted. Psychiatric: Mood and affect are normal. Speech and behavior are normal.  ____________________________________________  EKG: Interpreted by me. Sinus rhythm with a rate of 69 bpm, normal PR interval, normal QRS, normal QT.  ____________________________________________  ED COURSE:  Pertinent labs & imaging results that were available during my care of the patient were reviewed by me and considered in my medical decision making (see chart for details). Patient presents for chest pain and abdominal pain, we will assess with labs and imaging as indicated.   Procedures ____________________________________________   LABS (pertinent positives/negatives)  Labs Reviewed  URINALYSIS, COMPLETE (UACMP) WITH MICROSCOPIC - Abnormal; Notable for the following:       Result Value   Color, Urine STRAW (*)    APPearance CLEAR (*)    All other components within normal limits  TROPONIN I  LIPASE, BLOOD  COMPREHENSIVE METABOLIC PANEL  CBC    RADIOLOGY  Chest x-ray is normal CT renal protocolIs unremarkable ____________________________________________  FINAL ASSESSMENT AND PLAN  Chest pain, flank pain  Plan: Patient's labs and imaging were dictated above. Patient had presented for chest pain of uncertain etiology. Workup here has been negative. He is  stable for outpatient follow-up with his doctor.   Emily Filbert, MD   Note: This note was generated in part or whole with voice recognition software. Voice recognition is usually quite accurate but there are transcription errors that can and very often do occur. I apologize for any typographical errors that were not detected and corrected.     Emily Filbert, MD 03/03/17 [phone]

## 2017-03-03 NOTE — Telephone Encounter (Addendum)
Spoke with patient hurting in around belly button region to right lower portion describes pain sore feeling .   Woke up this am had really bad acid reflux burning esophagus, nauseous, light headed.   Hurting in chest , acid reflux feeling didn't feel like elephant sitting on chest pressure, burning sensation in chest, blood pressure was fine normal range 133/76.  Repeated blood pressure 123/72 at 853 .   Left arm numbness but he contributes that to sore shoulder from sleeping on that shoulder. Small shortness of breath due to anxiety feeling. Now no short of breath.   Presently denies  chest pain or radiation to back or shortness of breath presently . Still has numbness in left arm.    Patient would like go and schedule CT of Abdomen.    Advised patient to go on to ER and get evaluated  Could not rule out heart related symptoms.  Advised patient symptoms seem to be signs of heart attack.     Will check with Claris CheMargaret and get right back with patient.

## 2017-03-03 NOTE — Telephone Encounter (Signed)
Pt called and stated that he saw M. Arnett on Monday and is still not feeling well. Pt is still c/o stomach tenderness, stomach issues, lead headedness, and acid reflux. Please advise, thank you!  Call pt @ [phone]

## 2017-03-14 ENCOUNTER — Encounter: Payer: Self-pay | Admitting: Internal Medicine

## 2017-03-14 ENCOUNTER — Ambulatory Visit (INDEPENDENT_AMBULATORY_CARE_PROVIDER_SITE_OTHER): Payer: BLUE CROSS/BLUE SHIELD | Admitting: Internal Medicine

## 2017-03-14 VITALS — BP 116/78 | HR 92 | Temp 98.1°F | Resp 16 | Ht 70.0 in | Wt 224.4 lb

## 2017-03-14 DIAGNOSIS — M5137 Other intervertebral disc degeneration, lumbosacral region: Secondary | ICD-10-CM | POA: Diagnosis not present

## 2017-03-14 DIAGNOSIS — M130 Polyarthritis, unspecified: Secondary | ICD-10-CM

## 2017-03-14 DIAGNOSIS — K760 Fatty (change of) liver, not elsewhere classified: Secondary | ICD-10-CM

## 2017-03-14 DIAGNOSIS — M25512 Pain in left shoulder: Secondary | ICD-10-CM

## 2017-03-14 DIAGNOSIS — M546 Pain in thoracic spine: Secondary | ICD-10-CM | POA: Diagnosis not present

## 2017-03-14 DIAGNOSIS — E559 Vitamin D deficiency, unspecified: Secondary | ICD-10-CM | POA: Diagnosis not present

## 2017-03-14 DIAGNOSIS — G2581 Restless legs syndrome: Secondary | ICD-10-CM

## 2017-03-14 DIAGNOSIS — M25522 Pain in left elbow: Secondary | ICD-10-CM

## 2017-03-14 DIAGNOSIS — F411 Generalized anxiety disorder: Secondary | ICD-10-CM | POA: Diagnosis not present

## 2017-03-14 DIAGNOSIS — F331 Major depressive disorder, recurrent, moderate: Secondary | ICD-10-CM

## 2017-03-14 DIAGNOSIS — E785 Hyperlipidemia, unspecified: Secondary | ICD-10-CM | POA: Diagnosis not present

## 2017-03-14 DIAGNOSIS — Z23 Encounter for immunization: Secondary | ICD-10-CM

## 2017-03-14 DIAGNOSIS — G8929 Other chronic pain: Secondary | ICD-10-CM

## 2017-03-14 MED ORDER — TRAMADOL HCL 50 MG PO TABS: ORAL_TABLET | ORAL | 1 refills | Status: DC

## 2017-03-14 MED ORDER — ERGOCALCIFEROL 1.25 MG (50000 UT) PO CAPS: 50000.0000 [IU] | ORAL_CAPSULE | ORAL | 0 refills | Status: DC

## 2017-03-14 NOTE — Progress Notes (Signed)
Subjective:  Patient ID: Ronald Cordova, male    DOB: 30-Jul-1966  Age: 51 y.o. MRN: [id number]  CC: The primary encounter diagnosis was Hyperlipidemia with target LDL less than 130. Diagnoses of Vitamin D deficiency, Fatty liver, Need for hepatitis A and B vaccination, Left anterior shoulder pain, Restless legs, Acute pain of left shoulder, Chronic bilateral thoracic back pain, Anxiety state, Degeneration of lumbar or lumbosacral intervertebral disc, Left elbow pain, Polyarthritis of multiple sites, and Major depressive disorder, recurrent episode, moderate (HCC) were also pertinent to this visit.  HPI Ronald Cordova presents for follow up on multiple issues , including RLS  Recently seen for RLQ abdominal pain . He ruled out for prostatitis so cipro was not taken and symptoms improved.    Woke up on with chest pain/pressure 4 days later. Had CT of abdomen done.  fatty liver  And aortic atherosclerosis noted   Diverticulosis without inflammation noted in the  sigmoid area m  No stones   Stopped the crestor 2 weeks ago due to joint pain , and the pain subsided   FH of early CAD (brother had an MI at 76)  Taking tramadol for back pain.  NSAIDS c/i due to hypertension   Felt better on vitamin d supplementation   History of Hepatitis A and B vaccines , requesting proof of immunity   left shoulder painful  Over the A/c joint and laterallu  hurts to sleep on it referral to [facility] discussed     Outpatient Medications Prior to Visit  Medication Sig Dispense Refill  . busPIRone (BUSPAR) 7.5 MG tablet 1 tablet 2 to 3 times daily as needed for anxiety 90 tablet 2  . clonazePAM (KLONOPIN) 0.5 MG tablet Take 2 tablets (1 mg total) by mouth 2 (two) times daily. 60 tablet 2  . cyclobenzaprine (FLEXERIL) 10 MG tablet TAKE 1 TABLET BY MOUTH AT BEDTIME 30 tablet 4  . DEXILANT 60 MG capsule Take 1 capsule by mouth daily.    Marland Kitchen losartan-hydrochlorothiazide (HYZAAR) 50-12.5 MG tablet TAKE 1 TABLET BY  MOUTH DAILY. 90 tablet 1  . montelukast (SINGULAIR) 10 MG tablet Take 10 mg by mouth at bedtime.    Marland Kitchen nystatin ointment (MYCOSTATIN) Apply 1 application topically 2 (two) times daily. 30 g 1  . sildenafil (REVATIO) 20 MG tablet 3-5 po tablets daily as needed    . celecoxib (CELEBREX) 200 MG capsule Take 1 capsule (200 mg total) by mouth 2 (two) times daily. 60 capsule 5  . traMADol (ULTRAM) 50 MG tablet TAKE 1 TABLET BY MOUTH EVERY EIGHT HOURS AS NEEDED 90 tablet 5  . ciprofloxacin (CIPRO) 500 MG tablet Take 1 tablet (500 mg total) by mouth 2 (two) times daily. (Patient not taking: Reported on 03/03/2017) 28 tablet 0  . docusate (COLACE) 50 MG/5ML liquid Take 10 mLs (100 mg total) by mouth daily as needed for mild constipation. (Patient not taking: Reported on 03/14/2017) 25 mL 1  . ergocalciferol (DRISDOL) 50000 units capsule Take 1 capsule (50,000 Units total) by mouth once a week. (Patient not taking: Reported on 03/03/2017) 4 capsule 0  . fluticasone (VERAMYST) 27.5 MCG/SPRAY nasal spray Place 2 sprays into the nose daily. (Patient not taking: Reported on 03/03/2017) 30 g 3  . rosuvastatin (CRESTOR) 5 MG tablet TAKE 1 TABLET BY MOUTH EVERY DAY (Patient not taking: Reported on 03/14/2017) 90 tablet 1   No facility-administered medications prior to visit.     Review of Systems;  Patient denies  headache, fevers, malaise, unintentional weight loss, skin rash, eye pain, sinus congestion and sinus pain, sore throat, dysphagia,  hemoptysis , cough, dyspnea, wheezing, chest pain, palpitations, orthopnea, edema, abdominal pain, nausea, melena, diarrhea, constipation, flank pain, dysuria, hematuria, urinary  Frequency, nocturia, numbness, tingling, seizures,  Focal weakness, Loss of consciousness,  Tremor, insomnia, depression, anxiety, and suicidal ideation.      Objective:  BP 116/78 (BP Location: Left Arm, Patient Position: Sitting, Cuff Size: Normal)   Pulse 92   Temp 98.1 F (36.7 C) (Oral)    Resp 16   Ht 5\' 10"  (1.778 m)   Wt 224 lb 6.4 oz (101.8 kg)   SpO2 96%   BMI 32.20 kg/m   BP Readings from Last 3 Encounters:  03/14/17 116/78  03/03/17 126/85  02/28/17 122/74    Wt Readings from Last 3 Encounters:  03/14/17 224 lb 6.4 oz (101.8 kg)  03/03/17 223 lb (101.2 kg)  02/28/17 224 lb 9.6 oz (101.9 kg)    General appearance: alert, cooperative and appears stated age Ears: normal TM's and external ear canals both ears Throat: lips, mucosa, and tongue normal; teeth and gums normal Neck: no adenopathy, no carotid bruit, supple, symmetrical, trachea midline and thyroid not enlarged, symmetric, no tenderness/mass/nodules Back: symmetric, no curvature. ROM normal. No CVA tenderness. Lungs: clear to auscultation bilaterally Heart: regular rate and rhythm, S1, S2 normal, no murmur, click, rub or gallop Abdomen: soft, non-tender; bowel sounds normal; no masses,  no organomegaly Pulses: 2+ and symmetric Skin: Skin color, texture, turgor normal. No rashes or lesions Lymph nodes: Cervical, supraclavicular, and axillary nodes normal.  No results found for: HGBA1C  Lab Results  Component Value Date   CREATININE 0.98 03/03/2017   CREATININE 1.00 12/20/2016   CREATININE 1.12 07/28/2015    Lab Results  Component Value Date   WBC 7.6 03/03/2017   HGB 16.1 03/03/2017   HCT 47.1 03/03/2017   PLT 270 03/03/2017   GLUCOSE 95 03/03/2017   CHOL 167 12/20/2016   TRIG 153.0 (H) 12/20/2016   HDL 36.50 (L) 12/20/2016   LDLDIRECT 146.0 07/28/2015   LDLCALC 100 (H) 12/20/2016   ALT 40 03/03/2017   AST 27 03/03/2017   NA 141 03/03/2017   K 3.7 03/03/2017   CL 106 03/03/2017   CREATININE 0.98 03/03/2017   BUN 13 03/03/2017   CO2 26 03/03/2017   TSH 0.76 12/20/2016   PSA 0.69 02/28/2017    Dg Chest 2 View  Result Date: 03/03/2017 CLINICAL DATA:  Chest pain EXAM: CHEST  2 VIEW COMPARISON:  10/28/14 FINDINGS: The heart size and mediastinal contours are within normal limits.  Both lungs are clear. The visualized skeletal structures are unremarkable. IMPRESSION: No active cardiopulmonary disease. Electronically Signed   By: Alcide CleverMark  Lukens M.D.   On: 03/03/2017 10:34   Ct Renal Stone Study  Result Date: 03/03/2017 CLINICAL DATA:  Right flank pain/lower quadrant tenderness since last week. EXAM: CT ABDOMEN AND PELVIS WITHOUT CONTRAST TECHNIQUE: Multidetector CT imaging of the abdomen and pelvis was performed following the standard protocol without IV contrast. COMPARISON:  Abdominal ultrasound 12/02/2015 FINDINGS: Lower chest: Minimal scarring or atelectasis in the lingula. No pleural effusion. Hepatobiliary: Slightly decreased attenuation of the liver compared to the spleen compatible with steatosis. Unremarkable gallbladder. No biliary dilatation. Pancreas: Unremarkable. Spleen: Unremarkable. Adrenals/Urinary Tract: Unremarkable adrenal glands. No evidence of renal mass, hydronephrosis, or urinary tract calculi. Unremarkable bladder. Stomach/Bowel: The stomach is within normal limits. There is no evidence of bowel obstruction  or inflammation. Scattered sigmoid colon diverticula are noted without evidence of diverticulitis. Prior appendectomy. Vascular/Lymphatic: Incidental retroaortic left renal vein. Mild aortoiliac atherosclerosis without aneurysm. No enlarged lymph nodes. Reproductive: Unremarkable prostate. Other: No intraperitoneal free fluid. No abdominal wall mass or hernia. Musculoskeletal: Mild thoracolumbar spondylosis. No acute osseous abnormality or suspicious osseous lesion IMPRESSION: 1. No acute abnormality identified in the abdomen or pelvis. 2. Hepatic steatosis. 3. Mild sigmoid colon diverticulosis. 4.  Aortic Atherosclerosis (ICD10-I70.0). Electronically Signed   By: Sebastian AcheAllen  Grady M.D.   On: 03/03/2017 13:45    Assessment & Plan:   Problem List Items Addressed This Visit    Anxiety state    Recommend trial of buspirone while reducing clonazepam       Chronic  bilateral thoracic back pain    celebrex and tylenol prescribed. Tramadol for moderate pain       Relevant Medications   traMADol (ULTRAM) 50 MG tablet   Degeneration of lumbar or lumbosacral intervertebral disc    celebrex and tylenol prescribed. Tramadol refilled.       Relevant Medications   traMADol (ULTRAM) 50 MG tablet   RESOLVED: Elbow pain    Symptoms c/w lateral epicondylitis. Suspect ulnar nerve compression as well. Advised conservative therapy and education provided regarding sustained flexion ( e.g during sleep, driving) . Advised counter pressure brace for epicondylitis.        Fatty liver    Noted on CT .Marland Kitchen.  Low glycemic index diet, weight loss advised   Checking Hepatitis A and B  immunity       Relevant Orders   Comprehensive metabolic panel   Hyperlipidemia with target LDL less than 130 - Primary   Relevant Orders   Lipid panel   Left shoulder pain    Impingement syndrome suggested  Referral to Francena HanlyKevin Supple for evalua tion      Polyarthritis of multiple sites    serologies for auto immune syndromes were negative last year.  Current symptoms have improve with suspension of statin .   Lab Results  Component Value Date   ESRSEDRATE 6 12/01/2015   No results found for: ANA       Restless legs     iron studies, which are normal.  He has deferred trial of requip.   Lab Results  Component Value Date   FERRITIN 33.6 12/20/2016   Lab Results  Component Value Date   IRON 96 12/20/2016   TIBC 416 12/20/2016   FERRITIN 33.6 12/20/2016         Vitamin D deficiency   Relevant Orders   VITAMIN D 25 Hydroxy (Vit-D Deficiency, Fractures)    Other Visit Diagnoses    Need for hepatitis A and B vaccination       Relevant Orders   Hepatitis B surface antibody   Hepatitis A Ab, Total   Left anterior shoulder pain       Relevant Orders   Ambulatory referral to Orthopedic Surgery   Major depressive disorder, recurrent episode, moderate (HCC)   (Chronic)          I have discontinued Mr. Ronald Cordova's fluticasone, celecoxib, rosuvastatin, docusate, and ciprofloxacin. I am also having him maintain his DEXILANT, montelukast, cyclobenzaprine, sildenafil, clonazePAM, busPIRone, losartan-hydrochlorothiazide, nystatin ointment, traMADol, and ergocalciferol.  Meds ordered this encounter  Medications  . traMADol (ULTRAM) 50 MG tablet    Sig: TAKE 1 TABLET BY MOUTH EVERY EIGHT HOURS AS NEEDED    Dispense:  90 tablet    Refill:  1  This request is for a new prescription for a controlled substance as required by Federal/State law..  . ergocalciferol (DRISDOL) 50000 units capsule    Sig: Take 1 capsule (50,000 Units total) by mouth once a week.    Dispense:  4 capsule    Refill:  0    Medications Discontinued During This Encounter  Medication Reason  . ciprofloxacin (CIPRO) 500 MG tablet Therapy completed  . docusate (COLACE) 50 MG/5ML liquid Patient has not taken in last 30 days  . ergocalciferol (DRISDOL) 50000 units capsule Patient has not taken in last 30 days  . fluticasone (VERAMYST) 27.5 MCG/SPRAY nasal spray Patient has not taken in last 30 days  . rosuvastatin (CRESTOR) 5 MG tablet Patient has not taken in last 30 days  . traMADol (ULTRAM) 50 MG tablet Reorder  . celecoxib (CELEBREX) 200 MG capsule     Follow-up: No Follow-up on file.   Sherlene Shams, MD

## 2017-03-14 NOTE — Patient Instructions (Addendum)
We will repeat  Your cholesterol,  Check your Immunity to  Hep A and B ,  And Vitamin D in 4 weeks   I am Refilling the tramadol for use as needed   I am making a referral to Dr Francena HanlyKevin Supple for your shoulder  , at [facility]

## 2017-03-15 DIAGNOSIS — M7550 Bursitis of unspecified shoulder: Secondary | ICD-10-CM | POA: Insufficient documentation

## 2017-03-15 DIAGNOSIS — K760 Fatty (change of) liver, not elsewhere classified: Secondary | ICD-10-CM | POA: Insufficient documentation

## 2017-03-15 NOTE — Assessment & Plan Note (Addendum)
celebrex and tylenol prescribed. Tramadol refilled.

## 2017-03-15 NOTE — Assessment & Plan Note (Signed)
celebrex and tylenol prescribed. Tramadol for moderate pain

## 2017-03-15 NOTE — Assessment & Plan Note (Signed)
iron studies, which are normal.  He has deferred trial of requip.   Lab Results  Component Value Date   FERRITIN 33.6 12/20/2016   Lab Results  Component Value Date   IRON 96 12/20/2016   TIBC 416 12/20/2016   FERRITIN 33.6 12/20/2016

## 2017-03-15 NOTE — Assessment & Plan Note (Signed)
Recommend trial of buspirone while reducing clonazepam

## 2017-03-15 NOTE — Assessment & Plan Note (Addendum)
serologies for auto immune syndromes were negative last year.  Current symptoms have improve with suspension of statin .   Lab Results  Component Value Date   ESRSEDRATE 6 12/01/2015   No results found for: ANA

## 2017-03-15 NOTE — Assessment & Plan Note (Signed)
Symptoms c/w lateral epicondylitis. Suspect ulnar nerve compression as well. Advised conservative therapy and education provided regarding sustained flexion ( e.g during sleep, driving) . Advised counter pressure brace for epicondylitis.

## 2017-03-15 NOTE — Assessment & Plan Note (Signed)
Noted on CT .Marland Kitchen.  Low glycemic index diet, weight loss advised   Checking Hepatitis A and B  immunity

## 2017-03-15 NOTE — Assessment & Plan Note (Signed)
Impingement syndrome suggested  Referral to Francena HanlyKevin Supple for evalua tion

## 2017-04-13 ENCOUNTER — Other Ambulatory Visit: Payer: Self-pay | Admitting: Internal Medicine

## 2017-04-13 ENCOUNTER — Other Ambulatory Visit (INDEPENDENT_AMBULATORY_CARE_PROVIDER_SITE_OTHER): Payer: BLUE CROSS/BLUE SHIELD

## 2017-04-13 DIAGNOSIS — K760 Fatty (change of) liver, not elsewhere classified: Secondary | ICD-10-CM | POA: Diagnosis not present

## 2017-04-13 DIAGNOSIS — Z23 Encounter for immunization: Secondary | ICD-10-CM | POA: Diagnosis not present

## 2017-04-13 DIAGNOSIS — E559 Vitamin D deficiency, unspecified: Secondary | ICD-10-CM | POA: Diagnosis not present

## 2017-04-13 DIAGNOSIS — E785 Hyperlipidemia, unspecified: Secondary | ICD-10-CM | POA: Diagnosis not present

## 2017-04-13 LAB — LIPID PANEL
Cholesterol: 243 mg/dL — ABNORMAL HIGH (ref 0–200)
HDL: 30.8 mg/dL — ABNORMAL LOW (ref >39.00)
LDL CALC: 181 mg/dL — AB (ref 0–99)
NONHDL: 212.28
Total CHOL/HDL Ratio: 8
Triglycerides: 157 mg/dL — ABNORMAL HIGH (ref 0.0–149.0)
VLDL: 31.4 mg/dL (ref 0.0–40.0)

## 2017-04-13 LAB — COMPREHENSIVE METABOLIC PANEL
ALK PHOS: 46 U/L (ref 39–117)
ALT: 40 U/L (ref 0–53)
AST: 19 U/L (ref 0–37)
Albumin: 4.2 g/dL (ref 3.5–5.2)
BUN: 14 mg/dL (ref 6–23)
CHLORIDE: 105 meq/L (ref 96–112)
CO2: 29 mEq/L (ref 19–32)
Calcium: 9.8 mg/dL (ref 8.4–10.5)
Creatinine, Ser: 0.95 mg/dL (ref 0.40–1.50)
GFR: 89 mL/min (ref >60.00)
GLUCOSE: 90 mg/dL (ref 70–99)
POTASSIUM: 3.7 meq/L (ref 3.5–5.1)
SODIUM: 142 meq/L (ref 135–145)
Total Bilirubin: 0.6 mg/dL (ref 0.2–1.2)
Total Protein: 6.9 g/dL (ref 6.0–8.3)

## 2017-04-13 LAB — VITAMIN D 25 HYDROXY (VIT D DEFICIENCY, FRACTURES): VITD: 23.62 ng/mL — AB (ref 30.00–100.00)

## 2017-04-13 MED ORDER — ERGOCALCIFEROL 1.25 MG (50000 UT) PO CAPS: 50000.0000 [IU] | ORAL_CAPSULE | ORAL | 2 refills | Status: DC

## 2017-04-13 NOTE — Progress Notes (Signed)
drisdol 

## 2017-04-14 ENCOUNTER — Encounter: Payer: Self-pay | Admitting: Internal Medicine

## 2017-04-19 LAB — HEPATITIS A ANTIBODY, TOTAL: HEP A TOTAL AB: NONREACTIVE

## 2017-04-19 LAB — HEPATITIS B SURFACE ANTIBODY,QUALITATIVE: HEP B S AB: NONREACTIVE

## 2017-05-03 ENCOUNTER — Telehealth: Payer: Self-pay | Admitting: Internal Medicine

## 2017-05-03 NOTE — Telephone Encounter (Signed)
Patient called complaining of sinus infection. Symptoms started Saturday afternoon. Patient says that he is blowing dark yellow mucous. Sinus pressure behind eyes. Fever on the first day but none since then. Patient has been taking Theraflu but is not effective. Patient was wondering if we could call something in for him to take?

## 2017-05-03 NOTE — Telephone Encounter (Signed)
Does not warrant antibiotics based on description and chronicity (it has not been 5 days,  No ongoing fevers.  Needs ot use saline irrigation,  Phenylephrine 30 me every 6 hours,  During the day,  Afrin at night.  Needs to try these and if no better by Thursday,  I will call in abx

## 2017-05-03 NOTE — Telephone Encounter (Signed)
Pt called and stated that he has a bad sinus infection. Pt is c/o congestion and pressure. Pt would like to know if we could just call something in for him. Please advise, thank you!  Call pt @ [phone]

## 2017-05-03 NOTE — Telephone Encounter (Signed)
Patient is aware and stated that he would call back Thursday if not any better

## 2017-05-06 MED ORDER — PREDNISONE 10 MG PO TABS: ORAL_TABLET | ORAL | 0 refills | Status: DC

## 2017-05-06 MED ORDER — LEVOFLOXACIN 500 MG PO TABS
500.0000 mg | ORAL_TABLET | Freq: Every day | ORAL | 0 refills | Status: DC
Start: 2017-05-06 — End: 2017-08-11

## 2017-05-06 NOTE — Telephone Encounter (Signed)
Patient states that he does not feel any better. Denies fever, chills, nausea, vomit. He has been taking Mucinex D q 4 hrs, Afrin at night, and fluticasone propionate glucocorticoid. 1000 mg Vit C. Patient is wanting to know if you can go ahead and send in abx? Please advise.

## 2017-05-06 NOTE — Telephone Encounter (Signed)
Patient aware of prescriptions that were sent in.

## 2017-05-06 NOTE — Telephone Encounter (Signed)
Yes.   Ise nt levaquin and a 6 day prednisone taper to [facility] . Probiotic advised as well.

## 2017-05-06 NOTE — Telephone Encounter (Signed)
Pt called and stated that he is not feeling any better. He states that he has been taking mucinex every 4 hours. Pt states that whenever he can get something out it dark yellow/brown mucus. Please advise, thank you!  Call pt @ [phone]

## 2017-05-27 ENCOUNTER — Telehealth: Payer: Self-pay | Admitting: Internal Medicine

## 2017-05-27 MED ORDER — CLONAZEPAM 1 MG PO TABS: 1.0000 mg | ORAL_TABLET | Freq: Every day | ORAL | 3 refills | Status: DC

## 2017-05-27 NOTE — Telephone Encounter (Signed)
Pt called office, he thinks that his clonazePAM (KLONOPIN) 0.5 MG tablet, needs to be increase back to . Please refill. [facility]acy [facility] [address]

## 2017-05-27 NOTE — Telephone Encounter (Signed)
I can increase to 1 mg daily but not twice daily  The maximal dose is 1 mg daily .  My chart message sent,  Fax rx to [facility]

## 2017-05-27 NOTE — Telephone Encounter (Signed)
Spoke with pt and he stated that he has having an increase in his anxiety. He stated that he has a mother with dementia and a father that just had total knee replacement and he is having to take care of both of them while working a 60 hour week at [employer]. Pt would like to know if he could get an increase in his clonazepam.

## 2017-05-30 NOTE — Telephone Encounter (Signed)
Message was read by pt.

## 2017-07-06 ENCOUNTER — Other Ambulatory Visit: Payer: Self-pay | Admitting: Internal Medicine

## 2017-07-24 ENCOUNTER — Other Ambulatory Visit: Payer: Self-pay | Admitting: Internal Medicine

## 2017-08-11 ENCOUNTER — Ambulatory Visit: Payer: Self-pay | Admitting: *Deleted

## 2017-08-11 ENCOUNTER — Encounter: Payer: Self-pay | Admitting: Family Medicine

## 2017-08-11 ENCOUNTER — Ambulatory Visit: Payer: BLUE CROSS/BLUE SHIELD | Admitting: Family Medicine

## 2017-08-11 VITALS — BP 142/82 | HR 78 | Temp 98.2°F | Ht 70.0 in | Wt 225.0 lb

## 2017-08-11 DIAGNOSIS — J0110 Acute frontal sinusitis, unspecified: Secondary | ICD-10-CM | POA: Diagnosis not present

## 2017-08-11 MED ORDER — LEVOFLOXACIN 500 MG PO TABS: 500.0000 mg | ORAL_TABLET | Freq: Every day | ORAL | 0 refills | Status: DC

## 2017-08-11 NOTE — Progress Notes (Signed)
Sinus

## 2017-08-11 NOTE — Progress Notes (Signed)
RENOLD KOZAR - 51 y.o. male MRN [id number]  Date of birth: December 02, 1965  SUBJECTIVE:  Including CC & ROS.  Chief Complaint  Patient presents with  . Sinus drainage    Adon Gehlhausen is a 51 y.o. male that is presenting with sinus pressure/drainage. Has been ongoing for five days. Patient has been taking Dayquil with no improvement. Admits to fevers. Denies body aches and chills. Feels like his symptoms are staying the same. He has been around people at work with similar symptoms. He has had similar illnesses in the past. He has an allergy to penicillins. He symptoms are intermittent in nature.   Former tobacco user.   Review of chest xray from 03/03/17 was normal.    Review of Systems  Constitutional: Negative for fever.  HENT: Positive for rhinorrhea, sinus pressure and sore throat.   Respiratory: Negative for shortness of breath.   Cardiovascular: Negative for chest pain.  Gastrointestinal: Negative for abdominal pain.  Musculoskeletal: Negative for gait problem.    HISTORY: Past Medical, Surgical, Social, and Family History Reviewed & Updated per EMR.   Pertinent Historical Findings include:  Past Medical History:  Diagnosis Date  . Anxiety state, unspecified   . Eosinophilic esophagitis 3710   by EGD with  biopsies  . GERD (gastroesophageal reflux disease)    carafate added by Dr. Collene Mares  . Hemorrhoids   . Irritable bowel syndrome   . Polyp of colon, hyperplastic 01/08/2011   Juanita Craver, repeat due 2018  . Treadmill stress test negative for angina pectoris 2003    Past Surgical History:  Procedure Laterality Date  . APPENDECTOMY     done during high school  . carpal tunnel release  Sept 2008   R hand, by  [place], [place] ARTHROSCOPY  2000   left  . NASAL SINUS SURGERY  1998   bilateral    Allergies  Allergen Reactions  . Penicillins Hives    Has patient had a PCN reaction causing immediate rash, facial/tongue/throat swelling, SOB  or lightheadedness with hypotension: No Has patient had a PCN reaction causing severe rash involving mucus membranes or skin necrosis: No Has patient had a PCN reaction that required hospitalization: No Has patient had a PCN reaction occurring within the last 10 years: No If all of the above answers are "NO", then may proceed with Cephalosporin use.     Family History  Problem Relation Age of Onset  . Hyperlipidemia Sister   . Hyperlipidemia Brother   . Heart attack Brother 70  . Hyperlipidemia Mother   . Hypertension Mother   . Hyperlipidemia Father   . Hypertension Father   . BRCA 1/2 Maternal Aunt      Social History   Socioeconomic History  . Marital status: Single    Spouse name: Not on file  . Number of children: Not on file  . Years of education: Not on file  . Highest education level: Not on file  Social Needs  . Financial resource strain: Not on file  . Food insecurity - worry: Not on file  . Food insecurity - inability: Not on file  . Transportation needs - medical: Not on file  . Transportation needs - non-medical: Not on file  Occupational History  . Not on file  Tobacco Use  . Smoking status: Former Smoker    Packs/day: 1.00    Years: 15.00    Pack years: 15.00    Types:  Cigarettes    Last attempt to quit: 05/17/2011    Years since quitting: 6.2  . Smokeless tobacco: Never Used  Substance and Sexual Activity  . Alcohol use: Yes    Comment: occasional  . Drug use: No  . Sexual activity: Not on file  Other Topics Concern  . Not on file  Social History Narrative  . Not on file     PHYSICAL EXAM:  VS: BP (!) 142/82 (BP Location: Left Arm, Patient Position: Sitting, Cuff Size: Normal)   Pulse 78   Temp 98.2 F (36.8 C) (Oral)   Ht _0  (1.778 m)   Wt 225 lb (102.1 kg)   SpO2 98%   BMI 32.28 kg/m  Physical Exam Gen: NAD, alert, cooperative with exam,  ENT: normal lips, normal nasal mucosa, tympanic membranes clear and intact bilaterally,  normal oropharynx, no cervical lymphadenopathy, TTP of the frontal sinuses  Eye: normal EOM, normal conjunctiva and lids CV:  no edema, +2 pedal pulses, regular rate and rhythm, S1-S2   Resp: no accessory muscle use, non-labored, clear to auscultation bilaterally, no crackles or wheezes GI: no masses or tenderness, no hernia  Skin: no rashes, no areas of induration  Neuro: normal tone, normal sensation to touch Psych:  normal insight, alert and oriented MSK: Normal gait, normal strength       ASSESSMENT & PLAN:   Acute non-recurrent frontal sinusitis Possible for viral  - counseled on supportive care  - provided printed rx of levaquin due to the holiday season if his symptoms continue.

## 2017-08-11 NOTE — Telephone Encounter (Signed)
Called in c/o having a sinus infection.   When he woke up this morning he noticed he is having burning in his chest with coughing.   He had this same thing 2 months ago.   Took an antibiotic and a steroid which cleared him up. I got him an appt at the [facility] at [address] with Dr. Clare GandyJeremy Schmitz today at 4:00. He has been using the [employer] Pot but not getting any better.   Reason for Disposition . [1] Sinus pain (not just congestion) AND [2] fever  Answer Assessment - Initial Assessment Questions 1. LOCATION: "Where does it hurt?"      Pain behind my nose.  It burns.  Everything in there is swelled shut.   Cold medicine helped at first but now it's not helping. 2. ONSET: "When did the sinus pain start?"  (e.g., hours, days)      Started on Monday 3. SEVERITY: "How bad is the pain?"   (Scale 1-10; mild, moderate or severe)   - MILD (1-3): doesn't interfere with normal activities    - MODERATE (4-7): interferes with normal activities (e.g., work or school) or awakens from sleep   - SEVERE (8-10): excruciating pain and patient unable to do any normal activities        [place] [employer]   I got caught in the rain on Sunday at the game.   Plus I'm exposed to 1,000 of people.    Pain scale 3-it just burns. 4. RECURRENT SYMPTOM: "Have you ever had sinus problems before?" If so, ask: "When was the last time?" and "What happened that time?"      I'm burning in my chest.   I've had a sinus infection a couple of months ago.  5. NASAL CONGESTION: "Is the nose blocked?" If so, ask, "Can you open it or must you breathe through the mouth?"     My nose is stuffy now.   Was using Dyquil and Mucinex and the Netti pot. 6. NASAL DISCHARGE: "Do you have discharge from your nose?" If so ask, "What color?"     Yellow 7. FEVER: "Do you have a fever?" If so, ask: "What is it, how was it measured, and when did it start?"      Have a fever.   I can tell.  Having chills and  then getting hot. 8. OTHER SYMPTOMS: "Do you have any other symptoms?" (e.g., sore throat, cough, earache, difficulty breathing)     Burning in chest with coughing.   Sore throat.   Had tubes in my ears at 48 ears ago. 9. PREGNANCY: "Is there any chance you are pregnant?" "When was your last menstrual period?"     N/A  Protocols used: SINUS PAIN OR CONGESTION-A-AH

## 2017-08-11 NOTE — Patient Instructions (Signed)
Please try things such as zyrtec-D or allegra-D which is an antihistamine and decongestant.   Please try afrin which will help with nasal congestion but use for only three days.   Please also try using a netti pot on a regular occasion.  Honey can help with a sore throat.   

## 2017-08-12 DIAGNOSIS — J0110 Acute frontal sinusitis, unspecified: Secondary | ICD-10-CM | POA: Insufficient documentation

## 2017-08-12 NOTE — Assessment & Plan Note (Signed)
Possible for viral  - counseled on supportive care  - provided printed rx of levaquin due to the holiday season if his symptoms continue.

## 2017-08-18 ENCOUNTER — Ambulatory Visit: Payer: BLUE CROSS/BLUE SHIELD | Admitting: Family Medicine

## 2017-08-25 ENCOUNTER — Encounter: Payer: Self-pay | Admitting: Family Medicine

## 2017-08-25 ENCOUNTER — Ambulatory Visit: Payer: BLUE CROSS/BLUE SHIELD | Admitting: Family Medicine

## 2017-08-25 DIAGNOSIS — M7522 Bicipital tendinitis, left shoulder: Secondary | ICD-10-CM | POA: Diagnosis not present

## 2017-08-25 DIAGNOSIS — M7550 Bursitis of unspecified shoulder: Secondary | ICD-10-CM

## 2017-08-25 NOTE — Progress Notes (Signed)
Ronald Cordova - 52 y.o. male MRN [id number]  Date of birth: [birth year]  SUBJECTIVE:  Including CC & ROS.  Chief Complaint  Patient presents with  . Left shoulder pain    Ronald Cordova is a 52 y.o. male that is here for left shoulder pain. Patient is experiencing significant left shoulder pain. Pain is worsened upon lifting his shoulder in abduction. Pain located throughout his shoulder. Pain has been ongoing for a couple of months. Denies inciting event or surgeries. Has not been taking anything for the pain. He feels like the pain is staying the same. Nothing improves the pain. 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       Review of Systems  Constitutional: Negative for fever.  HENT: Negative for sinus pain.   Respiratory: Negative for shortness of breath.   Cardiovascular: Negative for chest pain.  Gastrointestinal: Negative for abdominal pain.  Musculoskeletal: Negative for gait problem and joint swelling.  Skin: Negative for color change.  Neurological: Negative for weakness.  Hematological: Negative for adenopathy.  Psychiatric/Behavioral: Negative for agitation.    HISTORY: Past Medical, Surgical, Social, and Family History Reviewed & Updated per EMR.   Pertinent Historical Findings include:  Past Medical History:  Diagnosis Date  . Anxiety state, unspecified   . Eosinophilic esophagitis 8676   by EGD with  biopsies  . GERD (gastroesophageal reflux disease)    carafate added by Dr. Collene Mares  . Hemorrhoids   . Irritable bowel syndrome   . Polyp of colon, hyperplastic 01/08/2011   Juanita Craver, repeat due 2018  . Treadmill stress test negative for angina pectoris 2003    Past Surgical History:  Procedure Laterality Date  . APPENDECTOMY     done during high school  . carpal tunnel release  Sept 2008   R hand, by  [place], [place] ARTHROSCOPY  2000   left  . NASAL SINUS SURGERY  1998   bilateral    Allergies  Allergen  Reactions  . Penicillins Hives    Has patient had a PCN reaction causing immediate rash, facial/tongue/throat swelling, SOB or lightheadedness with hypotension: No Has patient had a PCN reaction causing severe rash involving mucus membranes or skin necrosis: No Has patient had a PCN reaction that required hospitalization: No Has patient had a PCN reaction occurring within the last 10 years: No If all of the above answers are "NO", then may proceed with Cephalosporin use.     Family History  Problem Relation Age of Onset  . Hyperlipidemia Sister   . Hyperlipidemia Brother   . Heart attack Brother 26  . Hyperlipidemia Mother   . Hypertension Mother   . Hyperlipidemia Father   . Hypertension Father   . BRCA 1/2 Maternal Aunt      Social History   Socioeconomic History  . Marital status: Single    Spouse name: Not on file  . Number of children: Not on file  . Years of education: Not on file  . Highest education level: Not on file  Social Needs  . Financial resource strain: Not on file  . Food insecurity - worry: Not on file  . Food insecurity - inability: Not on file  . Transportation needs - medical: Not on file  . Transportation needs - non-medical: Not on file  Occupational History  . Not on file  Tobacco Use  . Smoking status: Former Smoker    Packs/day: 1.00  Years: 15.00    Pack years: 15.00    Types: Cigarettes    Last attempt to quit: 05/17/2011    Years since quitting: 6.2  . Smokeless tobacco: Never Used  Substance and Sexual Activity  . Alcohol use: Yes    Comment: occasional  . Drug use: No  . Sexual activity: Not on file  Other Topics Concern  . Not on file  Social History Narrative  . Not on file     PHYSICAL EXAM:  VS: BP 130/72 (BP Location: Left Arm, Patient Position: Sitting, Cuff Size: Normal)   Pulse 81   Temp 98.3 F (36.8 C) (Oral)   Ht '5\' 10"'  (1.778 m)   Wt 228 lb (103.4 kg)   SpO2 98%   BMI 32.71 kg/m  Physical Exam Gen: NAD,  alert, cooperative with exam, well-appearing ENT: normal lips, normal nasal mucosa,  Eye: normal EOM, normal conjunctiva and lids CV:  no edema, +2 pedal pulses   Resp: no accessory muscle use, non-labored,  GI: no masses or tenderness, no hernia  Skin: no rashes, no areas of induration  Neuro: normal tone, normal sensation to touch Psych:  normal insight, alert and oriented MSK:  Left Shoulder:  Limited active ROM  Normal Passive ROM  Normal ER  Normal strength to resistance with IR and ER  Normal Speed's test  Pain with empty can  Neurovascularly intact  Limited ultrasound: left shoulder:  BT was small hypoechoic change  Normal subscap, supraspinatus and infraspinatus Enlarged subacromial bursa   Summary: Findings suggestive of a subacromial bursitis. Either biceps tendonitis vs a superior labral tear   Ultrasound and interpretation by Clearance Coots, MD            Aspiration/Injection Procedure Note Ronald Cordova [birth year]  Procedure: Injection Indications: left shoulder pain  Procedure Details Consent: Risks of procedure as well as the alternatives and risks of each were explained to the (patient/caregiver).  Consent for procedure obtained. Time Out: Verified patient identification, verified procedure, site/side was marked, verified correct patient position, special equipment/implants available, medications/allergies/relevent history reviewed, required imaging and test results available.  Performed.  The area was cleaned with iodine and alcohol swabs.    The left subacromial space was injected using 1 cc's of 40 mg Depomedrol and 4 cc's of 1% lidocaine with a 25 1 1/2" needle.  Ultrasound was used. Images were obtained in  Long views showing the injection.    A sterile dressing was applied.  Patient did tolerate procedure well.         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  - injection  - counseled on HEP  - if no improvement consider PT  Biceps tendinitis Has a BT tendinitis or fluid tracking down from a labral tear  - Could consider a GH injection  - may need an MRI to further evaluate - could try nitro if no improvement

## 2017-08-25 NOTE — Patient Instructions (Signed)
You can try Aspercreme with lidocaine. We can try an injection around her elbow if the pain seems to worsen. Try adjusting your work and your posture at work

## 2017-08-27 DIAGNOSIS — M7520 Bicipital tendinitis, unspecified shoulder: Secondary | ICD-10-CM | POA: Insufficient documentation

## 2017-08-27 NOTE — Assessment & Plan Note (Signed)
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  - injection  - counseled on HEP  - if no improvement consider PT

## 2017-08-27 NOTE — Assessment & Plan Note (Signed)
Has a BT tendinitis or fluid tracking down from a labral tear  - Could consider a GH injection  - may need an MRI to further evaluate - could try nitro if no improvement

## 2017-09-08 ENCOUNTER — Ambulatory Visit: Payer: BLUE CROSS/BLUE SHIELD | Admitting: Family Medicine

## 2017-09-08 ENCOUNTER — Encounter: Payer: Self-pay | Admitting: Family Medicine

## 2017-09-08 VITALS — BP 142/84 | HR 74 | Temp 98.4°F | Ht 70.0 in | Wt 222.0 lb

## 2017-09-08 DIAGNOSIS — M545 Low back pain, unspecified: Secondary | ICD-10-CM | POA: Insufficient documentation

## 2017-09-08 DIAGNOSIS — G8929 Other chronic pain: Secondary | ICD-10-CM | POA: Insufficient documentation

## 2017-09-08 MED ORDER — CYCLOBENZAPRINE HCL 10 MG PO TABS: 10.0000 mg | ORAL_TABLET | Freq: Every day | ORAL | 1 refills | Status: DC

## 2017-09-08 NOTE — Patient Instructions (Signed)
Please try to Aspercreme with lidocaine to the area Applying heat to the area can help as well. Please try some of the exercises. If there is no improvement we can consider physical therapy.

## 2017-09-08 NOTE — Assessment & Plan Note (Signed)
Pain appears to be myofascial in nature. 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 - Trigger point injection today - Counseled on home exercise therapy - Refilled Flexeril - If no improvement can try physical therapy

## 2017-09-08 NOTE — Progress Notes (Signed)
Ronald Cordova - 52 y.o. male MRN [id number]  Date of birth: [birth year]  SUBJECTIVE:  Including CC & ROS.  Chief Complaint  Patient presents with  . Back Pain    Ronald Cordova is a 52 y.o. male that is here today for right sided lower back pain. The pain has acute on chronic. The pain is localized. The pain is exacerbated by prolonged sitting which has to do with his work. Denies any radicular symptoms. No pain or blood urination. He has not tried anything for the pain thus far. The pain is intermittent and staying the same. He denies any inciting event. Denies any prior surgeries.   Review of Systems  Constitutional: Negative for fever.  HENT: Negative for sinus pain.   Respiratory: Negative for cough.   Cardiovascular: Negative for chest pain.  Gastrointestinal: Negative for abdominal pain.  Musculoskeletal: Positive for back pain.  Skin: Negative for color change.  Neurological: Negative for weakness.  Hematological: Negative for adenopathy.  Psychiatric/Behavioral: Negative for agitation.    HISTORY: Past Medical, Surgical, Social, and Family History Reviewed & Updated per EMR.   Pertinent Historical Findings include:  Past Medical History:  Diagnosis Date  . Anxiety state, unspecified   . Eosinophilic esophagitis 4098   by EGD with  biopsies  . GERD (gastroesophageal reflux disease)    carafate added by Dr. Collene Mares  . Hemorrhoids   . Irritable bowel syndrome   . Polyp of colon, hyperplastic 01/08/2011   Ronald Cordova, repeat due 2018  . Treadmill stress test negative for angina pectoris 2003    Past Surgical History:  Procedure Laterality Date  . APPENDECTOMY     done during high school  . carpal tunnel release  Sept 2008   R hand, by  [place], [place] ARTHROSCOPY  2000   left  . NASAL SINUS SURGERY  1998   bilateral    Allergies  Allergen Reactions  . Penicillins Hives    Has patient had a PCN reaction causing immediate rash,  facial/tongue/throat swelling, SOB or lightheadedness with hypotension: No Has patient had a PCN reaction causing severe rash involving mucus membranes or skin necrosis: No Has patient had a PCN reaction that required hospitalization: No Has patient had a PCN reaction occurring within the last 10 years: No If all of the above answers are "NO", then may proceed with Cephalosporin use.     Family History  Problem Relation Age of Onset  . Hyperlipidemia Sister   . Hyperlipidemia Brother   . Heart attack Brother 79  . Hyperlipidemia Mother   . Hypertension Mother   . Hyperlipidemia Father   . Hypertension Father   . BRCA 1/2 Maternal Aunt      Social History   Socioeconomic History  . Marital status: Single    Spouse name: Not on file  . Number of children: Not on file  . Years of education: Not on file  . Highest education level: Not on file  Social Needs  . Financial resource strain: Not on file  . Food insecurity - worry: Not on file  . Food insecurity - inability: Not on file  . Transportation needs - medical: Not on file  . Transportation needs - non-medical: Not on file  Occupational History  . Not on file  Tobacco Use  . Smoking status: Former Smoker    Packs/day: 1.00    Years: 15.00    Pack years: 15.00  Types: Cigarettes    Last attempt to quit: 05/17/2011    Years since quitting: 6.3  . Smokeless tobacco: Never Used  Substance and Sexual Activity  . Alcohol use: Yes    Comment: occasional  . Drug use: No  . Sexual activity: Not on file  Other Topics Concern  . Not on file  Social History Narrative  . Not on file     PHYSICAL EXAM:  VS: BP (!) 142/84 (BP Location: Left Arm, Patient Position: Sitting, Cuff Size: Normal)   Pulse 74   Temp 98.4 F (36.9 C) (Oral)   Ht _0  (1.778 m)   Wt 222 lb (100.7 kg)   SpO2 96%   BMI 31.85 kg/m  Physical Exam Gen: NAD, alert, cooperative with exam, well-appearing ENT: normal lips, normal nasal mucosa,    Eye: normal EOM, normal conjunctiva and lids CV:  no edema, +2 pedal pulses   Resp: no accessory muscle use, non-labored,  Skin: no rashes, no areas of induration  Neuro: normal tone, normal sensation to touch Psych:  normal insight, alert and oriented MSK:  Back:  TTP of the right paraspinal muscles.  No TTP of the lumbar spine  Normal flexion and extension  Normal strength to resistance with hip flexion  Normal strength to resistance with knee flexion and extension Negative SLR b/l  neurovascularly intact    Aspiration/Injection Procedure Note Ronald Cordova 1966-07-10  Procedure: Injection Indications: right lower back pain   Procedure Details Consent: Risks of procedure as well as the alternatives and risks of each were explained to the (patient/caregiver).  Consent for procedure obtained. Time Out: Verified patient identification, verified procedure, site/side was marked, verified correct patient position, special equipment/implants available, medications/allergies/relevent history reviewed, required imaging and test results available.  Performed.  The area was cleaned with iodine and alcohol swabs.    The serratus and latissimus muscles as injected using 1 cc's of 40 mg Depomedrol and 4 cc's of 0.5% benzocaine with a 25 1 1/2" needle.  Ultrasound was used. Images were obtained in Long views showing the injection.    A sterile dressing was applied.  Patient did tolerate procedure well.        ASSESSMENT & PLAN:   Chronic right-sided low back pain without sciatica Pain appears to be myofascial in nature. 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 - Trigger point injection today - Counseled on home exercise therapy - Refilled Flexeril - If no improvement can try physical therapy

## 2017-09-19 ENCOUNTER — Other Ambulatory Visit: Payer: Self-pay | Admitting: Internal Medicine

## 2017-10-25 ENCOUNTER — Other Ambulatory Visit: Payer: Self-pay

## 2017-10-25 MED ORDER — LOSARTAN POTASSIUM-HCTZ 50-12.5 MG PO TABS: 1.0000 | ORAL_TABLET | Freq: Every day | ORAL | 0 refills | Status: DC

## 2017-11-03 ENCOUNTER — Encounter: Payer: Self-pay | Admitting: Family Medicine

## 2017-11-03 ENCOUNTER — Ambulatory Visit: Payer: BLUE CROSS/BLUE SHIELD | Admitting: Family Medicine

## 2017-11-03 VITALS — BP 142/86 | HR 86 | Temp 98.2°F | Ht 70.0 in | Wt 223.0 lb

## 2017-11-03 DIAGNOSIS — M25512 Pain in left shoulder: Secondary | ICD-10-CM | POA: Diagnosis not present

## 2017-11-03 MED ORDER — PREDNISONE 5 MG PO TABS
ORAL_TABLET | ORAL | 0 refills | Status: DC
Start: 2017-11-03 — End: 2017-11-21

## 2017-11-03 MED ORDER — DICLOFENAC SODIUM 2 % TD SOLN: 1.0000 "application " | Freq: Two times a day (BID) | TRANSDERMAL | 3 refills | Status: DC

## 2017-11-03 NOTE — Patient Instructions (Signed)
Please try the exercises  Follow up with me if you don't have any improvement and [facility] could [facility]  Please try to ice the shoulder

## 2017-11-03 NOTE — Progress Notes (Signed)
Ronald Cordova - 52 y.o. male MRN [id number]  Date of birth: Nov 28, 1965  SUBJECTIVE:  Including CC & ROS.  Chief Complaint  Patient presents with  . left shoulder pain    Ronald Cordova is a 52 y.o. male that is here for left shoulder pain. Pain has been increasing over the past two weeks. Pain is mild upon extension and flexion. He appreciates the pain is anterior in nature.  Pain is worse with he is pushing overhead.  He has been exercising with a sports trainer and noticed the pain. Denies specific injury. He has been taking Celebrex witih no improvement.   He was seen on 1/10 and provided with a subacromial injection.   Review of Systems  Constitutional: Negative for fever.  HENT: Negative for congestion.   Respiratory: Negative for cough.   Cardiovascular: Negative for chest pain.  Gastrointestinal: Negative for abdominal pain.  Musculoskeletal: Negative for joint swelling.  Skin: Negative for color change.  Neurological: Negative for weakness.  Hematological: Negative for adenopathy.  Psychiatric/Behavioral: Negative for agitation.    HISTORY: Past Medical, Surgical, Social, and Family History Reviewed & Updated per EMR.   Pertinent Historical Findings include:  Past Medical History:  Diagnosis Date  . Anxiety state, unspecified   . Eosinophilic esophagitis 0175   by EGD with  biopsies  . GERD (gastroesophageal reflux disease)    carafate added by Dr. Collene Mares  . Hemorrhoids   . Irritable bowel syndrome   . Polyp of colon, hyperplastic 01/08/2011   Juanita Craver, repeat due 2018  . Treadmill stress test negative for angina pectoris 2003    Past Surgical History:  Procedure Laterality Date  . APPENDECTOMY     done during high school  . carpal tunnel release  Sept 2008   R hand, by  [place], [place] ARTHROSCOPY  2000   left  . NASAL SINUS SURGERY  1998   bilateral    Allergies  Allergen Reactions  . Penicillins Hives    Has patient  had a PCN reaction causing immediate rash, facial/tongue/throat swelling, SOB or lightheadedness with hypotension: No Has patient had a PCN reaction causing severe rash involving mucus membranes or skin necrosis: No Has patient had a PCN reaction that required hospitalization: No Has patient had a PCN reaction occurring within the last 10 years: No If all of the above answers are "NO", then may proceed with Cephalosporin use.     Family History  Problem Relation Age of Onset  . Hyperlipidemia Sister   . Hyperlipidemia Brother   . Heart attack Brother 80  . Hyperlipidemia Mother   . Hypertension Mother   . Hyperlipidemia Father   . Hypertension Father   . BRCA 1/2 Maternal Aunt      Social History   Socioeconomic History  . Marital status: Single    Spouse name: Not on file  . Number of children: Not on file  . Years of education: Not on file  . Highest education level: Not on file  Occupational History  . Not on file  Social Needs  . Financial resource strain: Not on file  . Food insecurity:    Worry: Not on file    Inability: Not on file  . Transportation needs:    Medical: Not on file    Non-medical: Not on file  Tobacco Use  . Smoking status: Former Smoker    Packs/day: 1.00    Years:  15.00    Pack years: 15.00    Types: Cigarettes    Last attempt to quit: 05/17/2011    Years since quitting: 6.4  . Smokeless tobacco: Never Used  Substance and Sexual Activity  . Alcohol use: Yes    Comment: occasional  . Drug use: No  . Sexual activity: Not on file  Lifestyle  . Physical activity:    Days per week: Not on file    Minutes per session: Not on file  . Stress: Not on file  Relationships  . Social connections:    Talks on phone: Not on file    Gets together: Not on file    Attends religious service: Not on file    Active member of club or organization: Not on file    Attends meetings of clubs or organizations: Not on file    Relationship status: Not on  file  . Intimate partner violence:    Fear of current or ex partner: Not on file    Emotionally abused: Not on file    Physically abused: Not on file    Forced sexual activity: Not on file  Other Topics Concern  . Not on file  Social History Narrative  . Not on file     PHYSICAL EXAM:  VS: BP (!) 142/86 (BP Location: Left Arm, Patient Position: Sitting, Cuff Size: Normal)   Pulse 86   Temp 98.2 F (36.8 C) (Oral)   Ht _0  (1.778 m)   Wt 223 lb (101.2 kg)   SpO2 97%   BMI 32.00 kg/m  Physical Exam Gen: NAD, alert, cooperative with exam, well-appearing ENT: normal lips, normal nasal mucosa,  Eye: normal EOM, normal conjunctiva and lids CV:  no edema, +2 pedal pulses   Resp: no accessory muscle use, non-labored,  GI: no masses or tenderness, no hernia  Skin: no rashes, no areas of induration  Neuro: normal tone, normal sensation to touch Psych:  normal insight, alert and oriented MSK:  Left shoulder:  No TTP of the AC joint  Normal active flexion and abduction  Normal strength to resistance  Normal ER  Normal Speed's test  Neurovascularly intact   Limited ultrasound: left shoulder:  Normal appearing BT  Normal appearing AC joint and subscap  Summary: normal exam   Ultrasound and interpretation by Clearance Coots, MD        ASSESSMENT & PLAN:   Acute pain of left shoulder History would suggest more of a AC joint problem. On clinical exam, possible for more BT problem  - prednisone  - pennsaid - counseled on HEP for BT  - if no improvement consider nitro, PT, MRI

## 2017-11-05 DIAGNOSIS — M25512 Pain in left shoulder: Secondary | ICD-10-CM | POA: Insufficient documentation

## 2017-11-05 NOTE — Assessment & Plan Note (Addendum)
History would suggest more of a AC joint problem. On clinical exam, possible for more BT problem  - prednisone  - pennsaid - counseled on HEP for BT  - if no improvement consider nitro, PT, MRI

## 2017-11-07 ENCOUNTER — Ambulatory Visit: Payer: BLUE CROSS/BLUE SHIELD | Admitting: Internal Medicine

## 2017-11-07 ENCOUNTER — Encounter: Payer: Self-pay | Admitting: Internal Medicine

## 2017-11-07 ENCOUNTER — Telehealth: Payer: Self-pay | Admitting: Internal Medicine

## 2017-11-07 DIAGNOSIS — F411 Generalized anxiety disorder: Secondary | ICD-10-CM

## 2017-11-07 DIAGNOSIS — E66811 Obesity, class 1: Secondary | ICD-10-CM

## 2017-11-07 DIAGNOSIS — R0789 Other chest pain: Secondary | ICD-10-CM

## 2017-11-07 DIAGNOSIS — E669 Obesity, unspecified: Secondary | ICD-10-CM

## 2017-11-07 MED ORDER — CLONAZEPAM 1 MG PO TABS: 1.0000 mg | ORAL_TABLET | Freq: Every day | ORAL | 5 refills | Status: DC

## 2017-11-07 MED ORDER — DEXLANSOPRAZOLE 60 MG PO CPDR: 1.0000 | DELAYED_RELEASE_CAPSULE | Freq: Every day | ORAL | 1 refills | Status: DC

## 2017-11-07 MED ORDER — VENLAFAXINE HCL 37.5 MG PO TABS: 37.5000 mg | ORAL_TABLET | Freq: Two times a day (BID) | ORAL | 1 refills | Status: DC

## 2017-11-07 MED ORDER — SILDENAFIL CITRATE 20 MG PO TABS: ORAL_TABLET | ORAL | 1 refills | Status: DC

## 2017-11-07 MED ORDER — LOSARTAN POTASSIUM-HCTZ 100-12.5 MG PO TABS: 1.0000 | ORAL_TABLET | Freq: Every day | ORAL | 3 refills | Status: DC

## 2017-11-07 NOTE — Progress Notes (Signed)
Subjective:  Patient ID: Ronald Cordova, male    DOB: 15-Jul-1966  Age: 52 y.o. MRN: [id number]  CC: Diagnoses of Atypical chest pain, Anxiety state, and Obesity (BMI 30.0-34.9) were pertinent to this visit.  HPI Ronald Cordova presents for follow up on a "list" of  multiple issues .  Last seen in July after ER visit  .  A total of 40 minutes was spent with patient more than half of which was spent in counseling patient on the above mentioned issues , reviewing and explaining recent labs and imaging studies done, and coordination of care.   Seeing sport medicine for Left shoulder and back pain: :  Has had 2 steroid  injections by Clare Gandy  AndSAID cream containing  diclofenac gel  Which he is waiting  to receive.  Takes celebrex on a daily basis.  Was  Recently prescribed a prednisone taper ,  Still on it,  Has developed Some LE edema .  History of persistent right flank pain and new onset SSCP with LH  resulting in  ER visit July 2018  which included  An EKG, troponin and renal  CT scan which noted aortic atheroslclerosis.  EKG and troponin wwere normal.    Last LDL was 181 in August on Crestor 5 mg  And dose was increased.  Has not had repeat labs. No recurrent chest pain. ER visit reviewed with patient.  FH of CAD in brother also reviewed. Discussed cardiology referral.   Obesity:  Has started exercising and modifying diet.  Working with a Systems analyst.  Gets very anxious prior to workouts..  Denies chest pain with workouts.  using clonazepam  Pre workout. Want to retry phentermine for appetite suppression   Hypertension:  Not at goal despite adherence to medication.  Does not check at home.  Increased losartan to 100 mg  Today   Anxiety   Using clonazepam  One talbet daily in divided doses for insomnia and anxiety prior to  workouts.  Using  Buspirone as well.   Wants to resume effexor.     Outpatient Medications Prior to Visit  Medication Sig Dispense Refill  . celecoxib  (CELEBREX) 100 MG capsule Take 100 mg by mouth 2 (two) times daily.    . cyclobenzaprine (FLEXERIL) 10 MG tablet Take 1 tablet (10 mg total) by mouth at bedtime. 60 tablet 1  . Diclofenac Sodium (PENNSAID) 2 % SOLN Place 1 application onto the skin 2 (two) times daily. 1 Bottle 3  . losartan-hydrochlorothiazide (HYZAAR) 50-12.5 MG tablet Take 1 tablet by mouth daily. 90 tablet 0  . montelukast (SINGULAIR) 10 MG tablet Take 10 mg by mouth at bedtime.    Marland Kitchen nystatin ointment (MYCOSTATIN) Apply 1 application topically 2 (two) times daily. 30 g 1  . predniSONE (DELTASONE) 5 MG tablet Take 6 pills for first day, 5 pills second day, 4 pills third day, 3 pills fourth day, 2 pills the fifth day, and 1 pill sixth day. 21 tablet 0  . rosuvastatin (CRESTOR) 5 MG tablet TAKE 1 TABLET BY MOUTH EVERY DAY 90 tablet 1  . traMADol (ULTRAM) 50 MG tablet TAKE 1 TABLET BY MOUTH EVERY EIGHT HOURS AS NEEDED 90 tablet 1  . clonazePAM (KLONOPIN) 1 MG tablet Take 1 tablet (1 mg total) by mouth daily. 30 tablet 3  . DEXILANT 60 MG capsule Take 1 capsule by mouth daily.    . sildenafil (REVATIO) 20 MG tablet 3-5 po tablets daily as needed  No facility-administered medications prior to visit.     Review of Systems;  Patient denies headache, fevers, malaise, unintentional weight loss, skin rash, eye pain, sinus congestion and sinus pain, sore throat, dysphagia,  hemoptysis , cough, dyspnea, wheezing, chest pain, palpitations, orthopnea, edema, abdominal pain, nausea, melena, diarrhea, constipation, flank pain, dysuria, hematuria, urinary  Frequency, nocturia, numbness, tingling, seizures,  Focal weakness, Loss of consciousness,  Tremor, insomnia, depression, anxiety, and suicidal ideation.      Objective:  BP (!) 142/84 (BP Location: Left Arm, Patient Position: Sitting, Cuff Size: Normal)   Pulse 91   Temp 98.1 F (36.7 C) (Oral)   Resp 15   Ht 5\' 10"  (1.778 m)   Wt 220 lb 9.6 oz (100.1 kg)   SpO2 97%   BMI  31.65 kg/m   BP Readings from Last 3 Encounters:  11/07/17 (!) 142/84  11/03/17 (!) 142/86  09/08/17 (!) 142/84    Wt Readings from Last 3 Encounters:  11/07/17 220 lb 9.6 oz (100.1 kg)  11/03/17 223 lb (101.2 kg)  09/08/17 222 lb (100.7 kg)    General appearance: alert, cooperative and appears stated age Ears: normal TM's and external ear canals both ears Throat: lips, mucosa, and tongue normal; teeth and gums normal Neck: no adenopathy, no carotid bruit, supple, symmetrical, trachea midline and thyroid not enlarged, symmetric, no tenderness/mass/nodules Back: symmetric, no curvature. ROM normal. No CVA tenderness. Lungs: clear to auscultation bilaterally Heart: regular rate and rhythm, S1, S2 normal, no murmur, click, rub or gallop Abdomen: soft, non-tender; bowel sounds normal; no masses,  no organomegaly Pulses: 2+ and symmetric Skin: Skin color, texture, turgor normal. No rashes or lesions Lymph nodes: Cervical, supraclavicular, and axillary nodes normal.  No results found for: HGBA1C  Lab Results  Component Value Date   CREATININE 0.95 04/13/2017   CREATININE 0.98 03/03/2017   CREATININE 1.00 12/20/2016    Lab Results  Component Value Date   WBC 7.6 03/03/2017   HGB 16.1 03/03/2017   HCT 47.1 03/03/2017   PLT 270 03/03/2017   GLUCOSE 90 04/13/2017   CHOL 243 (H) 04/13/2017   TRIG 157.0 (H) 04/13/2017   HDL 30.80 (L) 04/13/2017   LDLDIRECT 146.0 07/28/2015   LDLCALC 181 (H) 04/13/2017   ALT 40 04/13/2017   AST 19 04/13/2017   NA 142 04/13/2017   K 3.7 04/13/2017   CL 105 04/13/2017   CREATININE 0.95 04/13/2017   BUN 14 04/13/2017   CO2 29 04/13/2017   TSH 0.76 12/20/2016   PSA 0.69 02/28/2017    Dg Chest 2 View  Result Date: 03/03/2017 CLINICAL DATA:  Chest pain EXAM: CHEST  2 VIEW COMPARISON:  10/28/14 FINDINGS: The heart size and mediastinal contours are within normal limits. Both lungs are clear. The visualized skeletal structures are unremarkable.  IMPRESSION: No active cardiopulmonary disease. Electronically Signed   By: Alcide Clever M.D.   On: 03/03/2017 10:34   Ct Renal Stone Study  Result Date: 03/03/2017 CLINICAL DATA:  Right flank pain/lower quadrant tenderness since last week. EXAM: CT ABDOMEN AND PELVIS WITHOUT CONTRAST TECHNIQUE: Multidetector CT imaging of the abdomen and pelvis was performed following the standard protocol without IV contrast. COMPARISON:  Abdominal ultrasound 12/02/2015 FINDINGS: Lower chest: Minimal scarring or atelectasis in the lingula. No pleural effusion. Hepatobiliary: Slightly decreased attenuation of the liver compared to the spleen compatible with steatosis. Unremarkable gallbladder. No biliary dilatation. Pancreas: Unremarkable. Spleen: Unremarkable. Adrenals/Urinary Tract: Unremarkable adrenal glands. No evidence of renal mass, hydronephrosis, or  urinary tract calculi. Unremarkable bladder. Stomach/Bowel: The stomach is within normal limits. There is no evidence of bowel obstruction or inflammation. Scattered sigmoid colon diverticula are noted without evidence of diverticulitis. Prior appendectomy. Vascular/Lymphatic: Incidental retroaortic left renal vein. Mild aortoiliac atherosclerosis without aneurysm. No enlarged lymph nodes. Reproductive: Unremarkable prostate. Other: No intraperitoneal free fluid. No abdominal wall mass or hernia. Musculoskeletal: Mild thoracolumbar spondylosis. No acute osseous abnormality or suspicious osseous lesion IMPRESSION: 1. No acute abnormality identified in the abdomen or pelvis. 2. Hepatic steatosis. 3. Mild sigmoid colon diverticulosis. 4.  Aortic Atherosclerosis (ICD10-I70.0). Electronically Signed   By: Sebastian Ache M.D.   On: 03/03/2017 13:45    Assessment & Plan:   Problem List Items Addressed This Visit    Obesity (BMI 30.0-34.9)    Reviewed his previous success at weight loss,  His er goal weight for BMI < 30 is 206 lbs).Given his hypertension and anxiety,  phentermine not the best choice.  He has fatty liver as well,  Will discuss Saxenda with patient at next visit       Atypical chest pain    Cardiac risk factors include history of tobacco abuse,  Hypertension and hyperlipidemia,  As well as a recent CT scan that noted aortic atherosclerosis.    He has had negative non invasive testing in the last ten years  But is concerned given his FH of early CAD (brother died at 22 of massive MI). Will advise referral to cardiology  .  crestor dose was increased at last visit for LDL > 160 ,  Continue losartan at higher dose, return for fasting labs asap and add baby aspirin         Anxiety state    Resuming effexor given past improvement in symptoms with use of this medication.  Clonazepam use reviewed,  Advised to limit use to once daily       Relevant Medications   venlafaxine (EFFEXOR) 37.5 MG tablet      I have changed Graylin Shiver. Brickhouse's DEXILANT to dexlansoprazole. I am also having him start on losartan-hydrochlorothiazide and venlafaxine. Additionally, I am having him maintain his montelukast, nystatin ointment, traMADol, cyclobenzaprine, rosuvastatin, losartan-hydrochlorothiazide, celecoxib, predniSONE, Diclofenac Sodium, sildenafil, and clonazePAM.  Meds ordered this encounter  Medications  . losartan-hydrochlorothiazide (HYZAAR) 100-12.5 MG tablet    Sig: Take 1 tablet by mouth daily.    Dispense:  90 tablet    Refill:  3  . sildenafil (REVATIO) 20 MG tablet    Sig: 3-5 po tablets daily as needed    Dispense:  90 tablet    Refill:  1    3 month supply  . venlafaxine (EFFEXOR) 37.5 MG tablet    Sig: Take 1 tablet (37.5 mg total) by mouth 2 (two) times daily.    Dispense:  180 tablet    Refill:  1  . dexlansoprazole (DEXILANT) 60 MG capsule    Sig: Take 1 capsule (60 mg total) by mouth daily.    Dispense:  90 capsule    Refill:  1  . clonazePAM (KLONOPIN) 1 MG tablet    Sig: Take 1 tablet (1 mg total) by mouth daily.    Dispense:  30  tablet    Refill:  5    Medications Discontinued During This Encounter  Medication Reason  . sildenafil (REVATIO) 20 MG tablet Reorder  . DEXILANT 60 MG capsule Reorder  . clonazePAM (KLONOPIN) 1 MG tablet Reorder    Follow-up: Return in about 1 month (around  12/05/2017) for anxiety .   Sherlene Shamseresa L Loriel Diehl, MD

## 2017-11-07 NOTE — Patient Instructions (Addendum)
I have increased your losartan to 100/12.5 mg dose for blood pressure  Increase your Crestor to 10 mg daily.  Our goal for  Your LDL is < 100    Repeat fasting labs in 6 weeks  We can use phentermine once your blood pressure Is 120/70   To 130/80   I can refer you for ADD testing if you  Your symptoms do not improve with management of anxiety

## 2017-11-07 NOTE — Telephone Encounter (Signed)
Patient wants to come back in for fasting lab work

## 2017-11-08 ENCOUNTER — Encounter: Payer: Self-pay | Admitting: Internal Medicine

## 2017-11-08 NOTE — Assessment & Plan Note (Signed)
Reviewed his previous success at weight loss,  His er goal weight for BMI < 30 is 206 lbs).Given his hypertension and anxiety, phentermine not the best choice.  He has fatty liver as well,  Will discuss Saxenda with patient at next visit

## 2017-11-08 NOTE — Assessment & Plan Note (Signed)
Cardiac risk factors include history of tobacco abuse,  Hypertension and hyperlipidemia,  As well as a recent CT scan that noted aortic atherosclerosis.    He has had negative non invasive testing in the last ten years  But is concerned given his FH of early CAD (brother died at 6652 of massive MI). Will advise referral to cardiology  .  crestor dose was increased at last visit for LDL > 160 ,  Continue losartan at higher dose, return for fasting labs asap and add baby aspirin

## 2017-11-08 NOTE — Assessment & Plan Note (Signed)
Resuming effexor given past improvement in symptoms with use of this medication.  Clonazepam use reviewed,  Advised to limit use to once daily

## 2017-11-09 ENCOUNTER — Telehealth: Payer: Self-pay

## 2017-11-09 NOTE — Telephone Encounter (Signed)
Formal [facility] patient last seen in 2017 now with referral from [facility] for atypical cp wants to know if testing can be ordered so that he can avoid spending hundreds of dollars on an ov for provider to order testing   Please advise

## 2017-11-09 NOTE — Telephone Encounter (Signed)
Patient will need appointment before any orders. Last seen in 2017

## 2017-11-10 ENCOUNTER — Telehealth: Payer: Self-pay | Admitting: Radiology

## 2017-11-10 ENCOUNTER — Other Ambulatory Visit: Payer: Self-pay | Admitting: Internal Medicine

## 2017-11-10 ENCOUNTER — Telehealth: Payer: Self-pay | Admitting: Internal Medicine

## 2017-11-10 DIAGNOSIS — Z79899 Other long term (current) drug therapy: Secondary | ICD-10-CM

## 2017-11-10 DIAGNOSIS — E785 Hyperlipidemia, unspecified: Secondary | ICD-10-CM

## 2017-11-10 NOTE — Telephone Encounter (Signed)
Copied from CRM [id number]. Topic: Quick Communication - See Telephone Encounter >> Nov 10, 2017  4:50 PM Arlyss Gandyichardson, Syrianna Schillaci N, NT wrote: CRM for notification. See Telephone encounter for: 11/10/17. PT calling and needs a note that states that he is safe to operate a vehicle since he takes his flexeril, tramadol, and klonopin at night. 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 Please let pt know when letter is ready for pick up. He is out of work until he can turn this letter in to his job.

## 2017-11-10 NOTE — Telephone Encounter (Signed)
Pt coming in for labs Monday, please place future orders. Thank you 

## 2017-11-10 NOTE — Telephone Encounter (Signed)
That's a lot of symptoms to attribute to a change in losartan dose. I would give it a bit longer,  Can his lab visit on Monday be amended to include an RN visit for vital sign check?

## 2017-11-10 NOTE — Telephone Encounter (Signed)
Can we schedule pt for a BP on Monday at the same time as his lab appt?

## 2017-11-10 NOTE — Telephone Encounter (Signed)
Spoke with pt and he already has a lab appt scheduled for Monday. While talking to the pt he stated that since his blood pressure medication losartan-hctz was increased to 100-12.5mg  he has started experiencing leg cramps, headaches, what he thinks is an irregular heart beat, dizziness and panic attacks. He stated that he spoke with his dad about this who takes three blood pressure medications and he told him that he may just have to give the medication some time and gave him a V8 and told him to drink it. The pt stated that as soon as he drank the V8 he instantly felt better. The pt has been taking the medication for 3 days. The pt is willing to take it a little bit longer and see if it gets better but he wants your opinion.

## 2017-11-10 NOTE — Telephone Encounter (Signed)
Spoke with the patient at length about this at the time of making an appointment.  Patient wanted to talk to the nurse for this advice

## 2017-11-10 NOTE — Telephone Encounter (Signed)
No answer. Mailbox full and cannot accept messages at this time. 

## 2017-11-11 NOTE — Telephone Encounter (Signed)
I will work him in on Monday.

## 2017-11-11 NOTE — Telephone Encounter (Signed)
OK to wright letter? 

## 2017-11-11 NOTE — Telephone Encounter (Signed)
Please advise 

## 2017-11-11 NOTE — Telephone Encounter (Signed)
Letter written

## 2017-11-11 NOTE — Telephone Encounter (Signed)
Spoke with patient and confirmed upcoming appointment with Dr. Mariah MillingGollan. Advised that additional testing would be reviewed with him at appointment. He reports that his brother died of sudden cardiac death and did not have any symptoms so he wants to follow up with provider and see if additional testing is needed. He verbalized understanding of our conversation, agreement with plan, and had no further questions at this time.

## 2017-11-11 NOTE — Telephone Encounter (Signed)
Patient notified letter ready and placed at front desk. Patient BP today 121/71 and he stated he is feeling much better with the medication.

## 2017-11-14 ENCOUNTER — Other Ambulatory Visit (INDEPENDENT_AMBULATORY_CARE_PROVIDER_SITE_OTHER): Payer: BLUE CROSS/BLUE SHIELD

## 2017-11-14 DIAGNOSIS — E785 Hyperlipidemia, unspecified: Secondary | ICD-10-CM | POA: Diagnosis not present

## 2017-11-14 DIAGNOSIS — Z79899 Other long term (current) drug therapy: Secondary | ICD-10-CM | POA: Diagnosis not present

## 2017-11-14 LAB — COMPREHENSIVE METABOLIC PANEL
ALK PHOS: 48 U/L (ref 39–117)
ALT: 29 U/L (ref 0–53)
AST: 18 U/L (ref 0–37)
Albumin: 4 g/dL (ref 3.5–5.2)
BUN: 15 mg/dL (ref 6–23)
CO2: 29 meq/L (ref 19–32)
Calcium: 9.4 mg/dL (ref 8.4–10.5)
Chloride: 103 mEq/L (ref 96–112)
Creatinine, Ser: 0.93 mg/dL (ref 0.40–1.50)
GFR: 91 mL/min (ref >60.00)
Glucose, Bld: 107 mg/dL — ABNORMAL HIGH (ref 70–99)
POTASSIUM: 3.8 meq/L (ref 3.5–5.1)
SODIUM: 140 meq/L (ref 135–145)
TOTAL PROTEIN: 7 g/dL (ref 6.0–8.3)
Total Bilirubin: 0.5 mg/dL (ref 0.2–1.2)

## 2017-11-14 LAB — LIPID PANEL
Cholesterol: 154 mg/dL (ref 0–200)
HDL: 27.6 mg/dL — AB (ref >39.00)
LDL CALC: 105 mg/dL — AB (ref 0–99)
NONHDL: 126.34
Total CHOL/HDL Ratio: 6
Triglycerides: 108 mg/dL (ref 0.0–149.0)
VLDL: 21.6 mg/dL (ref 0.0–40.0)

## 2017-11-16 ENCOUNTER — Other Ambulatory Visit: Payer: Self-pay | Admitting: Internal Medicine

## 2017-11-17 ENCOUNTER — Other Ambulatory Visit: Payer: Self-pay | Admitting: Internal Medicine

## 2017-11-17 NOTE — Telephone Encounter (Signed)
Refilled: 03/14/2017 Last OV: 11/07/2017 Next OV: 12/12/2017

## 2017-11-18 NOTE — Progress Notes (Signed)
Cardiology Office Note  Date:  11/21/2017   ID:  Ronald Cordova, DOB Jan 05, 1966, MRN [id number]  PCP:  Crecencio Mc, MD   Chief Complaint  Patient presents with  . Other    Referred by Dr. Derrel Nip for atypical chest pain and bad family history. Former ingal patient 2017. Meds reviewed verbally with patient.     HPI:  Ronald Cordova is a 52 y.o. male with past medical history of chest pain  shortness of breath Hypertension Hyperlipidemia Palpitations Anxiety  family history: brother died suddenly and was told it was from a massive heart attack, at age of 38,  Previously seen by Dr. Farrel Conners in 2017 Referred by Dr. Derrel Nip for cardiac risk factors  On keto, lost weight  Total cholesterol 243 now down to 154 on medication LDL 181 down to 105 Drives a 18 wheeler, no regular exercise program  Prior history of right flank pain Previously with chest pain emergency room July 2018 for chest pain abdominal tenderness  Previous CT scan chest July 2013 Renal CT July 2018 Previous stress test June 2017 no ischemia,   CT scan images pulled up in the office today Renal CT scan and chest CT 2013 No coronary calcifications noted He does have mild distal descending aorta and mild common iliac calcification  EKG personally reviewed by myself on todays visit  shows  normal sinus rhythm rate 77 bpm no significant ST or T wave changes   PMH:   has a past medical history of Anxiety state, unspecified, Eosinophilic esophagitis (3664), GERD (gastroesophageal reflux disease), Hemorrhoids, Irritable bowel syndrome, Polyp of colon, hyperplastic (01/08/2011), and Treadmill stress test negative for angina pectoris (2003).  PSH:    Past Surgical History:  Procedure Laterality Date  . APPENDECTOMY     done during high school  . carpal tunnel release  Sept 2008   R hand, by  [place], [place] ARTHROSCOPY  2000   left  . NASAL SINUS SURGERY  1998   bilateral     Current Outpatient Medications  Medication Sig Dispense Refill  . celecoxib (CELEBREX) 100 MG capsule Take 100 mg by mouth 2 (two) times daily.    . clonazePAM (KLONOPIN) 1 MG tablet Take 1 tablet (1 mg total) by mouth daily. 30 tablet 5  . cyclobenzaprine (FLEXERIL) 10 MG tablet Take 1 tablet (10 mg total) by mouth at bedtime. 60 tablet 1  . dexlansoprazole (DEXILANT) 60 MG capsule Take 1 capsule (60 mg total) by mouth daily. 90 capsule 1  . Diclofenac Sodium (PENNSAID) 2 % SOLN Place 1 application onto the skin 2 (two) times daily. 1 Bottle 3  . losartan-hydrochlorothiazide (HYZAAR) 100-12.5 MG tablet Take 1 tablet by mouth daily. 90 tablet 3  . montelukast (SINGULAIR) 10 MG tablet Take 10 mg by mouth at bedtime.    Marland Kitchen nystatin ointment (MYCOSTATIN) Apply 1 application topically 2 (two) times daily. 30 g 1  . rosuvastatin (CRESTOR) 5 MG tablet TAKE 1 TABLET BY MOUTH EVERY DAY 90 tablet 1  . sildenafil (REVATIO) 20 MG tablet 3-5 po tablets daily as needed 90 tablet 1  . traMADol (ULTRAM) 50 MG tablet Take 1 tablet (50 mg total) by mouth every 12 (twelve) hours as needed for up to 7 days for moderate pain. 60 tablet 5  . venlafaxine (EFFEXOR) 37.5 MG tablet Take 1 tablet (37.5 mg total) by mouth 2 (two) times daily. 180 tablet 1  .  ezetimibe (ZETIA) 10 MG tablet Take 1 tablet (10 mg total) by mouth daily. 90 tablet 3   No current facility-administered medications for this visit.      Allergies:   Penicillins   Social History:  The patient  reports that he quit smoking about 6 years ago. His smoking use included cigarettes. He has a 15.00 pack-year smoking history. He has never used smokeless tobacco. He reports that he drinks alcohol. He reports that he does not use drugs.   Family History:   family history includes BRCA 1/2 in his maternal aunt; Heart attack (age of onset: 53) in his brother; Hyperlipidemia in his brother, father, mother, and sister; Hypertension in his father and  mother.    Review of Systems: Review of Systems  Constitutional: Negative.   Respiratory: Negative.   Cardiovascular: Negative.   Gastrointestinal: Negative.   Musculoskeletal: Negative.   Neurological: Negative.   Psychiatric/Behavioral: Negative.   All other systems reviewed and are negative.    PHYSICAL EXAM: VS:  BP 130/80 (BP Location: Left Arm, Patient Position: Sitting, Cuff Size: Normal)   Pulse 77   Ht 5' 10" (1.778 m)   Wt 216 lb (98 kg)   BMI 30.99 kg/m  , BMI Body mass index is 30.99 kg/m. GEN: Well nourished, well developed, in no acute distress  HEENT: normal  Neck: no JVD, carotid bruits, or masses Cardiac: RRR; no murmurs, rubs, or gallops,no edema  Respiratory:  clear to auscultation bilaterally, normal work of breathing GI: soft, nontender, nondistended, + BS MS: no deformity or atrophy  Skin: warm and dry, no rash Neuro:  Strength and sensation are intact Psych: euthymic mood, full affect    Recent Labs: 12/20/2016: TSH 0.76 03/03/2017: Hemoglobin 16.1; Platelets 270 11/14/2017: ALT 29; BUN 15; Creatinine, Ser 0.93; Potassium 3.8; Sodium 140    Lipid Panel Lab Results  Component Value Date   CHOL 154 11/14/2017   HDL 27.60 (L) 11/14/2017   LDLCALC 105 (H) 11/14/2017   TRIG 108.0 11/14/2017      Wt Readings from Last 3 Encounters:  11/21/17 216 lb (98 kg)  11/07/17 220 lb 9.6 oz (100.1 kg)  11/03/17 223 lb (101.2 kg)       ASSESSMENT AND PLAN:  Chest pain with moderate risk for cardiac etiology - Plan: EKG 12-Lead Previous atypical pain, no further workup at this time Discussed risk stratification He will call us if he would like CT coronary calcium scoring Will likely be very low given findings on recent CT scan 2013 in 2019   Mixed hyperlipidemia recommended he add Zetia 10 mg daily to his Crestor goal LDL less than 70 given aortic atherosclerosis  Essential hypertension Blood pressure is well controlled on today's visit. No  changes made to the medications.  Palpitations Denies having significant symptoms  Shortness of breath Recommended weight loss exercise program   Aortic atherosclerosis Seen on CT scan, mild We will push LDL to 70 or less   Disposition:   F/U  12 months as needed   Total encounter time more than 25 minutes  Greater than 50% was spent in counseling and coordination of care with the patient     Orders Placed This Encounter  Procedures  . EKG 12-Lead     Signed, Esmond Plants, M.D., Ph.D. 11/21/2017  [place], [place]

## 2017-11-18 NOTE — Telephone Encounter (Signed)
Printed, signed and faxed.  

## 2017-11-21 ENCOUNTER — Encounter: Payer: Self-pay | Admitting: Cardiovascular Disease

## 2017-11-21 ENCOUNTER — Ambulatory Visit: Payer: BLUE CROSS/BLUE SHIELD | Admitting: Cardiovascular Disease

## 2017-11-21 VITALS — BP 130/80 | HR 77 | Ht 70.0 in | Wt 216.0 lb

## 2017-11-21 DIAGNOSIS — R0602 Shortness of breath: Secondary | ICD-10-CM

## 2017-11-21 DIAGNOSIS — I10 Essential (primary) hypertension: Secondary | ICD-10-CM | POA: Diagnosis not present

## 2017-11-21 DIAGNOSIS — R079 Chest pain, unspecified: Secondary | ICD-10-CM

## 2017-11-21 DIAGNOSIS — R002 Palpitations: Secondary | ICD-10-CM | POA: Diagnosis not present

## 2017-11-21 DIAGNOSIS — I700 Atherosclerosis of aorta: Secondary | ICD-10-CM | POA: Diagnosis not present

## 2017-11-21 DIAGNOSIS — E782 Mixed hyperlipidemia: Secondary | ICD-10-CM | POA: Diagnosis not present

## 2017-11-21 MED ORDER — EZETIMIBE 10 MG PO TABS: 10.0000 mg | ORAL_TABLET | Freq: Every day | ORAL | 3 refills | Status: DC

## 2017-11-21 NOTE — Patient Instructions (Addendum)
Medication Instructions:   Start zetia one a day for cholesterol Goal LDL <70  Labwork:  No new labs needed  Testing/Procedures:  No further testing at this time   Follow-Up: It was a pleasure seeing you in the office today. Please call us if you have new issues that need to be addressed before your next appt.  [phone]  Your physician wants you to follow-up in: 12 months as needed.  You will receive a reminder letter in the mail two months in advance. If you don't receive a letter, please call our office to schedule the follow-up appointment.  If you need a refill on your cardiac medications before your next appointment, please call your pharmacy.  For educational health videos Log in to : [email or web address] Or : [email or web address], password : [email or web address]

## 2017-12-12 ENCOUNTER — Ambulatory Visit: Payer: BLUE CROSS/BLUE SHIELD | Admitting: Internal Medicine

## 2017-12-12 ENCOUNTER — Encounter: Payer: Self-pay | Admitting: Internal Medicine

## 2017-12-12 VITALS — BP 116/64 | HR 86 | Temp 98.4°F | Resp 15 | Ht 70.0 in | Wt 214.8 lb

## 2017-12-12 DIAGNOSIS — Z23 Encounter for immunization: Secondary | ICD-10-CM

## 2017-12-12 DIAGNOSIS — F411 Generalized anxiety disorder: Secondary | ICD-10-CM

## 2017-12-12 DIAGNOSIS — R079 Chest pain, unspecified: Secondary | ICD-10-CM

## 2017-12-12 DIAGNOSIS — K760 Fatty (change of) liver, not elsewhere classified: Secondary | ICD-10-CM | POA: Diagnosis not present

## 2017-12-12 DIAGNOSIS — E782 Mixed hyperlipidemia: Secondary | ICD-10-CM

## 2017-12-12 DIAGNOSIS — F331 Major depressive disorder, recurrent, moderate: Secondary | ICD-10-CM

## 2017-12-12 DIAGNOSIS — E559 Vitamin D deficiency, unspecified: Secondary | ICD-10-CM

## 2017-12-12 MED ORDER — NYSTATIN 100000 UNIT/GM EX OINT: 1.0000 "application " | TOPICAL_OINTMENT | Freq: Two times a day (BID) | CUTANEOUS | 1 refills | Status: DC

## 2017-12-12 MED ORDER — CLONAZEPAM 1 MG PO TABS: 1.0000 mg | ORAL_TABLET | Freq: Two times a day (BID) | ORAL | 5 refills | Status: DC | PRN

## 2017-12-12 NOTE — Progress Notes (Signed)
Subjective:  Patient ID: Ronald Cordova, male    DOB: [birth year]  Age: 52 y.o. MRN: [id number]  CC: The primary encounter diagnosis was Mixed hyperlipidemia. Diagnoses of Vitamin D deficiency, Need for hepatitis A and B vaccination, Moderate episode of recurrent major depressive disorder (HCC), Fatty liver, Anxiety state, and Chest pain with moderate risk for cardiac etiology were also pertinent to this visit.  HPI Ronald Cordova presents for  One month follow up on hypertension , hyperlipidemia,  Chest pain with FH of CAD  And anxiety.  Losartan dose increased to 100 mg daily  At last visit  Tolerating medication change,  No orthostatic symptoms.   LDL reduced to 105 of  5 mg Crestor . Dr. Mariah Milling recommended adding Zetia for goal LDL < 70 due to aortic c atherosclerosis .  Tolerating medication additon  Chest Pain:  Had an evaluation by Dr. Mariah Milling.  No indication for cath,  But Noninvasive testing with ct coronary calcium scoring offered given aortic atherosclerosis noted on prior CT .  No other work up recommended at this time.  Has decided he wants to  Have the test.   Anxiety; improved with repeat trial of wellbutrin, but having side effect of anorgasmia. Using generic viagra.   Outpatient Medications Prior to Visit  Medication Sig Dispense Refill  . celecoxib (CELEBREX) 100 MG capsule Take 100 mg by mouth 2 (two) times daily.    . cyclobenzaprine (FLEXERIL) 10 MG tablet Take 1 tablet (10 mg total) by mouth at bedtime. 60 tablet 1  . dexlansoprazole (DEXILANT) 60 MG capsule Take 1 capsule (60 mg total) by mouth daily. 90 capsule 1  . Diclofenac Sodium (PENNSAID) 2 % SOLN Place 1 application onto the skin 2 (two) times daily. 1 Bottle 3  . ezetimibe (ZETIA) 10 MG tablet Take 1 tablet (10 mg total) by mouth daily. 90 tablet 3  . losartan-hydrochlorothiazide (HYZAAR) 100-12.5 MG tablet Take 1 tablet by mouth daily. 90 tablet 3  . montelukast (SINGULAIR) 10 MG tablet Take 10 mg by mouth at  bedtime.    . rosuvastatin (CRESTOR) 5 MG tablet TAKE 1 TABLET BY MOUTH EVERY DAY 90 tablet 1  . sildenafil (REVATIO) 20 MG tablet 3-5 po tablets daily as needed 90 tablet 1  . traMADol (ULTRAM) 50 MG tablet TAKE 1 TABLET BY MOUTH EVERY 12 HOURS AS NEEDED FOR MODERATE PAIN  5  . venlafaxine (EFFEXOR) 37.5 MG tablet Take 1 tablet (37.5 mg total) by mouth 2 (two) times daily. 180 tablet 1  . clonazePAM (KLONOPIN) 1 MG tablet Take 1 tablet (1 mg total) by mouth daily. 30 tablet 5  . nystatin ointment (MYCOSTATIN) Apply 1 application topically 2 (two) times daily. 30 g 1   No facility-administered medications prior to visit.     Review of Systems;  Patient denies headache, fevers, malaise, unintentional weight loss, skin rash, eye pain, sinus congestion and sinus pain, sore throat, dysphagia,  hemoptysis , cough, dyspnea, wheezing, chest pain, palpitations, orthopnea, edema, abdominal pain, nausea, melena, diarrhea, constipation, flank pain, dysuria, hematuria, urinary  Frequency, nocturia, numbness, tingling, seizures,  Focal weakness, Loss of consciousness,  Tremor, insomnia, depression, anxiety, and suicidal ideation.      Objective:  BP 116/64 (BP Location: Left Arm, Patient Position: Sitting, Cuff Size: Large)   Pulse 86   Temp 98.4 F (36.9 C) (Oral)   Resp 15   Ht  (1.778 m)   Wt 214 lb 12.8 oz (97.4 kg)  SpO2 97%   BMI 30.82 kg/m   BP Readings from Last 3 Encounters:  12/12/17 116/64  11/21/17 130/80  11/07/17 (!) 142/84    Wt Readings from Last 3 Encounters:  12/12/17 214 lb 12.8 oz (97.4 kg)  11/21/17 216 lb (98 kg)  11/07/17 220 lb 9.6 oz (100.1 kg)    General appearance: alert, cooperative and appears stated age Ears: normal TM's and external ear canals both ears Throat: lips, mucosa, and tongue normal; teeth and gums normal Neck: no adenopathy, no carotid bruit, supple, symmetrical, trachea midline and thyroid not enlarged, symmetric, no  tenderness/mass/nodules Back: symmetric, no curvature. ROM normal. No CVA tenderness. Lungs: clear to auscultation bilaterally Heart: regular rate and rhythm, S1, S2 normal, no murmur, click, rub or gallop Abdomen: soft, non-tender; bowel sounds normal; no masses,  no organomegaly Pulses: 2+ and symmetric Skin: Skin color, texture, turgor normal. No rashes or lesions Lymph nodes: Cervical, supraclavicular, and axillary nodes normal.  No results found for: HGBA1C  Lab Results  Component Value Date   CREATININE 0.93 11/14/2017   CREATININE 0.95 04/13/2017   CREATININE 0.98 03/03/2017    Lab Results  Component Value Date   WBC 7.6 03/03/2017   HGB 16.1 03/03/2017   HCT 47.1 03/03/2017   PLT 270 03/03/2017   GLUCOSE 107 (H) 11/14/2017   CHOL 154 11/14/2017   TRIG 108.0 11/14/2017   HDL 27.60 (L) 11/14/2017   LDLDIRECT 146.0 07/28/2015   LDLCALC 105 (H) 11/14/2017   ALT 29 11/14/2017   AST 18 11/14/2017   NA 140 11/14/2017   K 3.8 11/14/2017   CL 103 11/14/2017   CREATININE 0.93 11/14/2017   BUN 15 11/14/2017   CO2 29 11/14/2017   TSH 0.76 12/20/2016   PSA 0.69 02/28/2017    Dg Chest 2 View  Result Date: 03/03/2017 CLINICAL DATA:  Chest pain EXAM: CHEST  2 VIEW COMPARISON:  10/28/14 FINDINGS: The heart size and mediastinal contours are within normal limits. Both lungs are clear. The visualized skeletal structures are unremarkable. IMPRESSION: No active cardiopulmonary disease. Electronically Signed   By: Alcide Clever M.D.   On: 03/03/2017 10:34   Ct Renal Stone Study  Result Date: 03/03/2017 CLINICAL DATA:  Right flank pain/lower quadrant tenderness since last week. EXAM: CT ABDOMEN AND PELVIS WITHOUT CONTRAST TECHNIQUE: Multidetector CT imaging of the abdomen and pelvis was performed following the standard protocol without IV contrast. COMPARISON:  Abdominal ultrasound 12/02/2015 FINDINGS: Lower chest: Minimal scarring or atelectasis in the lingula. No pleural effusion.  Hepatobiliary: Slightly decreased attenuation of the liver compared to the spleen compatible with steatosis. Unremarkable gallbladder. No biliary dilatation. Pancreas: Unremarkable. Spleen: Unremarkable. Adrenals/Urinary Tract: Unremarkable adrenal glands. No evidence of renal mass, hydronephrosis, or urinary tract calculi. Unremarkable bladder. Stomach/Bowel: The stomach is within normal limits. There is no evidence of bowel obstruction or inflammation. Scattered sigmoid colon diverticula are noted without evidence of diverticulitis. Prior appendectomy. Vascular/Lymphatic: Incidental retroaortic left renal vein. Mild aortoiliac atherosclerosis without aneurysm. No enlarged lymph nodes. Reproductive: Unremarkable prostate. Other: No intraperitoneal free fluid. No abdominal wall mass or hernia. Musculoskeletal: Mild thoracolumbar spondylosis. No acute osseous abnormality or suspicious osseous lesion IMPRESSION: 1. No acute abnormality identified in the abdomen or pelvis. 2. Hepatic steatosis. 3. Mild sigmoid colon diverticulosis. 4.  Aortic Atherosclerosis (ICD10-I70.0). Electronically Signed   By: Sebastian Ache M.D.   On: 03/03/2017 13:45    Assessment & Plan:   Problem List Items Addressed This Visit    Vitamin D  deficiency   Relevant Orders   VITAMIN D 25 Hydroxy (Vit-D Deficiency, Fractures)   Mixed hyperlipidemia - Primary    Tolerating addition of zetia to crestor,  Will return in one  Month for fasting lipids      Relevant Orders   Lipid panel   Comprehensive metabolic panel   Major depressive disorder, recurrent episode (HCC)    complicatd by GAD.  Improving with effexor.  No changes today       Fatty liver    Noted on CT .Marland Kitchen  Low glycemic index diet, weight loss advised   Serologies for  Hepatitis A and B  immunity  Were  negative ,  Fist dose of Twinrix given today.        Chest pain with moderate risk for cardiac etiology    He has requested noninvasive risk stratification with  cardiac coronary CT       Anxiety state    Resuming effexor given past improvement in symptoms with use of this medication.  Clonazepam use reviewed,  Advised to limit use to once daily except in extreme situations.  Refills for #60/month given. The risks and benefits of benzodiazepine use were discussed with patient today including excessive sedation leading to respiratory depression,  impaired thinking/driving, and addiction.  Patient was advised to avoid concurrent use with alcohol, to use medication only as needed and not to share with others  .        Other Visit Diagnoses    Need for hepatitis A and B vaccination       Relevant Orders   Hepatitis A hepatitis B combined vaccine IM (Completed)      I have changed Graylin Shiver. Ferger's clonazePAM. I am also having him maintain his montelukast, cyclobenzaprine, rosuvastatin, celecoxib, Diclofenac Sodium, losartan-hydrochlorothiazide, sildenafil, venlafaxine, dexlansoprazole, ezetimibe, traMADol, and nystatin ointment.  Meds ordered this encounter  Medications  . clonazePAM (KLONOPIN) 1 MG tablet    Sig: Take 1 tablet (1 mg total) by mouth 2 (two) times daily as needed for anxiety.    Dispense:  60 tablet    Refill:  5  . nystatin ointment (MYCOSTATIN)    Sig: Apply 1 application topically 2 (two) times daily.    Dispense:  30 g    Refill:  1    Medications Discontinued During This Encounter  Medication Reason  . clonazePAM (KLONOPIN) 1 MG tablet Reorder  . nystatin ointment (MYCOSTATIN) Reorder    Follow-up: Return in about 6 months (around 06/13/2018).   Sherlene Shams, MD

## 2017-12-12 NOTE — Patient Instructions (Addendum)
I have refilled the Klonipin for use twice daily if needed   I have refilled the Nystatin ointment  The first dose of the Hepatitis A and B vaccines was given to you today  Return in 1 month for fasting labs and 2nd dose

## 2017-12-13 NOTE — Assessment & Plan Note (Signed)
Noted on CT .Marland Kitchen  Low glycemic index diet, weight loss advised   Serologies for  Hepatitis A and B  immunity  Were  negative ,  Fist dose of Twinrix given today.

## 2017-12-13 NOTE — Assessment & Plan Note (Signed)
complicatd by GAD.  Improving with effexor.  No changes today

## 2017-12-13 NOTE — Assessment & Plan Note (Signed)
He has requested noninvasive risk stratification with cardiac coronary CT

## 2017-12-13 NOTE — Assessment & Plan Note (Signed)
Resuming effexor given past improvement in symptoms with use of this medication.  Clonazepam use reviewed,  Advised to limit use to once daily except in extreme situations.  Refills for #60/month given. The risks and benefits of benzodiazepine use were discussed with patient today including excessive sedation leading to respiratory depression,  impaired thinking/driving, and addiction.  Patient was advised to avoid concurrent use with alcohol, to use medication only as needed and not to share with others  .

## 2017-12-13 NOTE — Assessment & Plan Note (Signed)
Tolerating addition of zetia to crestor,  Will return in one  Month for fasting lipids

## 2017-12-15 ENCOUNTER — Telehealth: Payer: Self-pay | Admitting: *Deleted

## 2017-12-15 ENCOUNTER — Encounter (INDEPENDENT_AMBULATORY_CARE_PROVIDER_SITE_OTHER): Payer: Self-pay

## 2017-12-15 DIAGNOSIS — E782 Mixed hyperlipidemia: Secondary | ICD-10-CM

## 2017-12-15 NOTE — Telephone Encounter (Signed)
Order entered and mychart message sent to patient with information.

## 2017-12-15 NOTE — Telephone Encounter (Signed)
-----   Message from Antonieta Iba, MD sent at 12/14/2017 10:31 PM EDT ----- Triage, Can we place an order for CT coronary calcium score thx TGollan  ----- Message ----- From: Sherlene Shams, MD Sent: 12/12/2017   5:36 PM To: Antonieta Iba, MD  Ronald Cordova has decided to go ahead with the CT coronary calcium test,  Will you order?

## 2017-12-29 ENCOUNTER — Telehealth: Payer: Self-pay | Admitting: Internal Medicine

## 2017-12-29 ENCOUNTER — Encounter: Payer: Self-pay | Admitting: *Deleted

## 2017-12-29 NOTE — Telephone Encounter (Signed)
Scheduled patient for Hep A/B and sent my chart message .  Multiple attempts made to reach by phone.

## 2018-01-02 ENCOUNTER — Other Ambulatory Visit (INDEPENDENT_AMBULATORY_CARE_PROVIDER_SITE_OTHER): Payer: BLUE CROSS/BLUE SHIELD

## 2018-01-02 ENCOUNTER — Other Ambulatory Visit: Payer: BLUE CROSS/BLUE SHIELD

## 2018-01-02 DIAGNOSIS — E559 Vitamin D deficiency, unspecified: Secondary | ICD-10-CM | POA: Diagnosis not present

## 2018-01-02 DIAGNOSIS — E782 Mixed hyperlipidemia: Secondary | ICD-10-CM | POA: Diagnosis not present

## 2018-01-02 LAB — COMPREHENSIVE METABOLIC PANEL
ALK PHOS: 45 U/L (ref 39–117)
ALT: 34 U/L (ref 0–53)
AST: 16 U/L (ref 0–37)
Albumin: 4.3 g/dL (ref 3.5–5.2)
BILIRUBIN TOTAL: 0.7 mg/dL (ref 0.2–1.2)
BUN: 11 mg/dL (ref 6–23)
CALCIUM: 9.6 mg/dL (ref 8.4–10.5)
CO2: 28 mEq/L (ref 19–32)
Chloride: 106 mEq/L (ref 96–112)
Creatinine, Ser: 0.9 mg/dL (ref 0.40–1.50)
GFR: 94.46 mL/min (ref >60.00)
GLUCOSE: 97 mg/dL (ref 70–99)
Potassium: 4.3 mEq/L (ref 3.5–5.1)
Sodium: 142 mEq/L (ref 135–145)
TOTAL PROTEIN: 7.1 g/dL (ref 6.0–8.3)

## 2018-01-02 LAB — VITAMIN D 25 HYDROXY (VIT D DEFICIENCY, FRACTURES): VITD: 21.92 ng/mL — AB (ref 30.00–100.00)

## 2018-01-02 LAB — LIPID PANEL
Cholesterol: 125 mg/dL (ref 0–200)
HDL: 34.5 mg/dL — AB (ref >39.00)
LDL Cholesterol: 70 mg/dL (ref 0–99)
NONHDL: 90.25
TRIGLYCERIDES: 103 mg/dL (ref 0.0–149.0)
Total CHOL/HDL Ratio: 4
VLDL: 20.6 mg/dL (ref 0.0–40.0)

## 2018-01-04 ENCOUNTER — Other Ambulatory Visit: Payer: Self-pay | Admitting: Internal Medicine

## 2018-01-04 DIAGNOSIS — E559 Vitamin D deficiency, unspecified: Secondary | ICD-10-CM

## 2018-01-04 MED ORDER — ERGOCALCIFEROL 1.25 MG (50000 UT) PO CAPS: 50000.0000 [IU] | ORAL_CAPSULE | ORAL | 0 refills | Status: DC

## 2018-01-04 NOTE — Assessment & Plan Note (Signed)
Recurrent  Drisdol weekly x 3 months prescribed followed by daily dose of 2000 Ius

## 2018-01-12 ENCOUNTER — Telehealth: Payer: Self-pay

## 2018-01-12 NOTE — Telephone Encounter (Signed)
Copied from CRM [id number]. Topic: Appointment Scheduling - Scheduling Inquiry for Clinic >> Jan 12, 2018 11:14 AM Windy Kalata, NT wrote: Reason for CRM: patient is calling and states that his eye doctor can see him Monday 01/16/18 at 1;55pm and he has a nurse visit at 3pm he would like to know can this appt be pushed back later that day. He is off on mondays and tries to get all his appts in on that day. Please contact pt.

## 2018-01-16 ENCOUNTER — Ambulatory Visit (INDEPENDENT_AMBULATORY_CARE_PROVIDER_SITE_OTHER): Payer: BLUE CROSS/BLUE SHIELD | Admitting: *Deleted

## 2018-01-16 DIAGNOSIS — Z23 Encounter for immunization: Secondary | ICD-10-CM | POA: Diagnosis not present

## 2018-03-03 ENCOUNTER — Telehealth: Payer: Self-pay

## 2018-03-03 MED ORDER — EZETIMIBE 10 MG PO TABS: 10.0000 mg | ORAL_TABLET | Freq: Every day | ORAL | 3 refills | Status: DC

## 2018-03-03 NOTE — Telephone Encounter (Signed)
Refill sent for Zetia 90 day supply with 3 refills to [facility]

## 2018-03-07 ENCOUNTER — Telehealth: Payer: Self-pay | Admitting: Internal Medicine

## 2018-03-07 NOTE — Telephone Encounter (Signed)
Copied from CRM [phone]#[id number]. Topic: Quick Communication - Rx Refill/Question >> Mar 07, 2018 12:16 PM Floria RavelingStovall, Shana A wrote: Medication:  Crestor  Losartan  Dexilant   Has the patient contacted their pharmacy?  No  (Agent: If no, request that the patient contact the pharmacy for the refill.) (Agent: If yes, when and what did the pharmacy advise?)  Preferred Pharmacy (with phone number or street name): [facility].  Phone number [phone]  Fax number - [phone] Pt needs 90 day supply of these meds and new script sent to mail order.  He is switching pharmacy    Agent: Please be advised that RX refills may take up to 3 business days. We ask that you follow-up with your pharmacy.

## 2018-03-08 MED ORDER — LOSARTAN POTASSIUM-HCTZ 100-12.5 MG PO TABS: 1.0000 | ORAL_TABLET | Freq: Every day | ORAL | 1 refills | Status: DC

## 2018-03-08 MED ORDER — ROSUVASTATIN CALCIUM 5 MG PO TABS: 5.0000 mg | ORAL_TABLET | Freq: Every day | ORAL | 1 refills | Status: DC

## 2018-03-08 MED ORDER — DEXLANSOPRAZOLE 60 MG PO CPDR: 1.0000 | DELAYED_RELEASE_CAPSULE | Freq: Every day | ORAL | 1 refills | Status: DC

## 2018-03-08 NOTE — Telephone Encounter (Signed)
Refill requests :   Crestor; LRF 09/19/17; #90; RF x 1  Losartan-Hydrochlorothiazide; LRF 11/07/17; #90; RF x 3  Dexilant; LRF 11/07/17; # 90; RF x 1  Last OV; 12/12/17  PCP Dr. Darrick Huntsmanullo  [facility] [place]  Medications refilled per protocol

## 2018-03-24 ENCOUNTER — Other Ambulatory Visit: Payer: Self-pay | Admitting: Internal Medicine

## 2018-03-27 ENCOUNTER — Other Ambulatory Visit: Payer: Self-pay | Admitting: Internal Medicine

## 2018-03-28 ENCOUNTER — Other Ambulatory Visit: Payer: Self-pay | Admitting: Internal Medicine

## 2018-03-29 NOTE — Telephone Encounter (Signed)
Refilled: 11/26/2017 Last OV: 12/12/2017 Next OV: not scheduled

## 2018-04-07 ENCOUNTER — Encounter: Payer: Self-pay | Admitting: Nurse Practitioner

## 2018-04-07 ENCOUNTER — Ambulatory Visit: Payer: BLUE CROSS/BLUE SHIELD | Admitting: Nurse Practitioner

## 2018-04-07 ENCOUNTER — Ambulatory Visit: Payer: Self-pay | Admitting: *Deleted

## 2018-04-07 VITALS — BP 140/80 | HR 88 | Temp 98.5°F | Ht 70.0 in | Wt 213.5 lb

## 2018-04-07 DIAGNOSIS — R197 Diarrhea, unspecified: Secondary | ICD-10-CM

## 2018-04-07 DIAGNOSIS — R109 Unspecified abdominal pain: Secondary | ICD-10-CM

## 2018-04-07 NOTE — Telephone Encounter (Signed)
Contacted pt regarding symptoms; he complains of lower abdominal pain; the says that he had a root canal 2 weeks ago and he was started on clindamycin; he states that he called his dentist and was told to stop taking the medication due to stomach cramps and diarrhea; he notices that the cramps after he eats something; the pt says his last dose was on 04/04/18; the pt says that he got a probiotic yesterday; he says that the diarrhea did not start until 04/03/18; recommendations made per nurse triage protocol to include seeing a physician within 24 hours; the pt [facility], but she has no availability within parameters per protocol; no other providers in the office has no availability; pt [facility] but the pt can notget to a morning appointment; pt offered and accepted a 1430 today with Alphonse GuildAshleigh Shambley, [facility]; he verbalizes understanding; will route to office for notification of this upcoming appointment.  Reason for Disposition . [1] MODERATE pain (e.g., interferes with normal activities) AND [2] pain comes and goes (cramps) AND [3] present > 24 hours  (Exception: pain with Vomiting or Diarrhea - see that Guideline)  Answer Assessment - Initial Assessment Questions 1. LOCATION: "Where does it hurt?"      Lower abdomen 2. RADIATION: "Does the pain shoot anywhere else?" (e.g., chest, back)     no 3. ONSET: "When did the pain begin?" (Minutes, hours or days ago)      Days  4. SUDDEN: "Gradual or sudden onset?"     gradual 5. PATTERN "Does the pain come and go, or is it constant?"    - If constant: "Is it getting better, staying the same, or worsening?"      (Note: Constant means the pain never goes away completely; most serious pain is constant and it progresses)     - If intermittent: "How long does it last?" "Do you have pain now?"     (Note: Intermittent means the pain goes away completely between bouts)     intermittent 6.  SEVERITY: "How bad is the pain?"  (e.g., Scale 1-10; mild, moderate, or severe)    - MILD (1-3): doesn't interfere with normal activities, abdomen soft and not tender to touch     - MODERATE (4-7): interferes with normal activities or awakens from sleep, tender to touch     - SEVERE (8-10): excruciating pain, doubled over, unable to do any normal activities       moderate 7. RECURRENT SYMPTOM: "Have you ever had this type of abdominal pain before?" If so, ask: "When was the last time?" and "What happened that time?"      Yes when taking antibiotics 8. CAUSE: "What do you think is causing the abdominal pain?"     Taking clindamycin 9. RELIEVING/AGGRAVATING FACTORS: "What makes it better or worse?" (e.g., movement, antacids, bowel movement)     no 10. OTHER SYMPTOMS: "Has there been any vomiting, diarrhea, constipation, or urine problems?"       diarrhea  Protocols used: ABDOMINAL PAIN - MALE-A-AH

## 2018-04-07 NOTE — Progress Notes (Signed)
Name: Ronald Cordova   MRN: [id number]    DOB: [birth year]   Date:04/07/2018       Progress Note  Subjective  Chief Complaint Abdominal pain  HPI  Ronald Cordova is here today for evaluation of an acute complaint of abdominal pain and diarrhea, which seemed to start after beginning a course of clindamycin last week for dental infection. He started 8 day course clindamycin last Tuesday for dental infection, on Friday began to notice abdominal cramping and nausea after taking the medication, then this past Tuesday began to have frequent watery bowel movements. He took his last dose of the clindamycin 2 days ago on Wednesday but has continued to have abdominal cramping, pain and 3-4 episodes of diarrhea per day until today, he did take immodium this am and has not had a bowel movement since. He also started probiotics this am. He denies fevers, weakness, confusion, syncope, vomiting, rectal bleeding. He does have a history of IBS which he feels is contributing to his symptoms   Patient Active Problem List   Diagnosis Date Noted  . Aortic atherosclerosis (HCC) 11/21/2017  . Shortness of breath 11/21/2017  . Acute pain of left shoulder 11/05/2017  . Chronic right-sided low back pain without sciatica 09/08/2017  . Biceps tendinitis 08/27/2017  . Acute non-recurrent frontal sinusitis 08/12/2017  . Subacromial bursitis 03/15/2017  . Fatty liver 03/15/2017  . Vitamin D deficiency 12/23/2016  . Restless legs 12/21/2016  . Ulnar neuropathy at elbow of left upper extremity 06/29/2016  . Family history of sudden death in brother 12/02/2015  . Polyarthritis of multiple sites 12/02/2015  . Chronic bilateral thoracic back pain 07/29/2015  . Palpitations 07/29/2015  . Essential hypertension 11/13/2014  . Major depressive disorder, recurrent episode (HCC) 11/03/2014  . Degeneration of lumbar or lumbosacral intervertebral disc 01/14/2014  . Obesity (BMI 30.0-34.9) 08/28/2012  . Incidental pulmonary nodule, > 3mm  and < 8mm 02/21/2012  . 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 Isolated or specific phobia 01/21/2012  . Chest pain with moderate risk for cardiac etiology 01/21/2012  . Mixed hyperlipidemia 05/09/2011  . Treadmill stress test negative for angina pectoris   . Anxiety state   . Irritable bowel syndrome   . Hemorrhoids     Social History   Tobacco Use  . Smoking status: Former Smoker    Packs/day: 1.00    Years: 15.00    Pack years: 15.00    Types: Cigarettes    Last attempt to quit: 05/17/2011    Years since quitting: 6.8  . Smokeless tobacco: Never Used  Substance Use Topics  . Alcohol use: Yes    Comment: occasional     Current Outpatient Medications:  .  celecoxib (CELEBREX) 100 MG capsule, Take 100 mg by mouth 2 (two) times daily., Disp: , Rfl:  .  clonazePAM (KLONOPIN) 1 MG tablet, Take 1 tablet (1 mg total) by mouth 2 (two) times daily as needed for anxiety., Disp: 60 tablet, Rfl: 5 .  cyclobenzaprine (FLEXERIL) 10 MG tablet, Take 1 tablet (10 mg total) by mouth at bedtime., Disp: 60 tablet, Rfl: 1 .  dexlansoprazole (DEXILANT) 60 MG capsule, Take 1 capsule (60 mg total) by mouth daily., Disp: 90 capsule, Rfl: 1 .  Diclofenac Sodium (PENNSAID) 2 % SOLN, Place 1 application onto the skin 2 (two) times daily., Disp: 1 Bottle, Rfl: 3 .  ezetimibe (ZETIA) 10 MG tablet, Take 1 tablet (10 mg total) by mouth daily., Disp: 90 tablet, Rfl: 3 .  losartan-hydrochlorothiazide (HYZAAR) 100-12.5 MG tablet, Take 1 tablet by mouth daily., Disp: 90 tablet, Rfl: 1 .  montelukast (SINGULAIR) 10 MG tablet, Take 10 mg by mouth at bedtime., Disp: , Rfl:  .  nystatin ointment (MYCOSTATIN), Apply 1 application topically 2 (two) times daily., Disp: 30 g, Rfl: 1 .  rosuvastatin (CRESTOR) 5 MG tablet, Take 1 tablet (5 mg total) by mouth daily., Disp: 90 tablet, Rfl: 1 .  sildenafil (REVATIO) 20 MG tablet, 3-5 po tablets daily as needed, Disp: 90 tablet, Rfl: 1 .  traMADol (ULTRAM) 50 MG tablet,  TAKE 1 TABLET BY MOUTH EVERY 12 HOURS ASNEEDED FOR MODERATE PAIN, Disp: 60 tablet, Rfl: 3 .  venlafaxine (EFFEXOR) 37.5 MG tablet, Take 1 tablet (37.5 mg total) by mouth 2 (two) times daily., Disp: 180 tablet, Rfl: 1 .  Vitamin D, Ergocalciferol, (DRISDOL) 50000 units CAPS capsule, TAKE ONE CAPSULE BY MOUTH ONE TIME PER WEEK, Disp: 12 capsule, Rfl: 0  Allergies  Allergen Reactions  . Penicillins Hives    Has patient had a PCN reaction causing immediate rash, facial/tongue/throat swelling, SOB or lightheadedness with hypotension: No Has patient had a PCN reaction causing severe rash involving mucus membranes or skin necrosis: No Has patient had a PCN reaction that required hospitalization: No Has patient had a PCN reaction occurring within the last 10 years: No If all of the above answers are "NO", then may proceed with Cephalosporin use.     ROS  No other specific complaints in a complete review of systems (except as listed in HPI above).  Objective  Vitals:   04/07/18 1425  BP: 140/80  Pulse: 88  Temp: 98.5 F (36.9 C)  TempSrc: Oral  SpO2: 97%  Weight: 213 lb 8 oz (96.8 kg)  Height: 5\' 10"  (1.778 m)    Body mass index is 30.63 kg/m.  Nursing Note and Vital Signs reviewed.  Physical Exam  Constitutional: Patient appears well-developed and well-nourished.  No distress.  HEENT: head atraumatic, normocephalic, pupils equal and reactive to light, EOM's intact,  neck supple, oropharynx pink and moist without exudate Cardiovascular: Normal rate, regular rhythm, S1/S2 present. Distal pulses intact. Pulmonary/Chest: Effort normal and breath sounds clear. No respiratory distress or retractions. Abdominal: Soft. Bowel sounds are normal, no distension. There is mild generalized tenderness. no masses Neurological: He is alert and oriented to person, place, and time. No cranial nerve deficit. Coordination, balance, strength, speech and gait are normal.  Skin: Skin is warm and dry.  No rash noted. No erythema.  Psychiatric: Patient has a normal mood and affect. behavior is normal. Judgment and thought content normal.  Assessment & Plan  Abdominal pain, unspecified abdominal location, Diarrhea, unspecified type Due to short duration of symptoms, no further workup ordered at this time We discussed continuing probiotics, prn imodium for diarrhea He will follow up if symptoms persist for >1 week, will consider additional testing including stool testing if symptoms continue -home management, Red flags and when to present for emergency care or RTC including fever >101.62F,  new/worsening/un-resolving symptoms,  reviewed with patient at time of visit. Follow up and care instructions discussed and provided in AVS.

## 2018-04-07 NOTE — Patient Instructions (Signed)
Please continue imodium as needed and probiotics.  Please follow up if diarrhea persists into next week.  Remember to stay hydrated!   Diarrhea, Adult Diarrhea is when you have loose and water poop (stool) often. Diarrhea can make you feel weak and cause you to get dehydrated. Dehydration can make you tired and thirsty, make you have a dry mouth, and make it so you pee (urinate) less often. Diarrhea often lasts 2-3 days. However, it can last longer if it is a sign of something more serious. It is important to treat your diarrhea as told by your doctor. Follow these instructions at home: Eating and drinking  Follow these recommendations as told by your doctor:  Take an oral rehydration solution (ORS). This is a drink that is sold at pharmacies and stores.  Drink clear fluids, such as: ? Water. ? Ice chips. ? Diluted fruit juice. ? Low-calorie sports drinks.  Eat bland, easy-to-digest foods in small amounts as you are able. These foods include: ? Bananas. ? Applesauce. ? Rice. ? Low-fat (lean) meats. ? Toast. ? Crackers.  Avoid drinking fluids that have a lot of sugar or caffeine in them.  Avoid alcohol.  Avoid spicy or fatty foods.  General instructions   Drink enough fluid to keep your pee (urine) clear or pale yellow.  Wash your hands often. If you cannot use soap and water, use hand sanitizer.  Make sure that all people in your home wash their hands well and often.  Take over-the-counter and prescription medicines only as told by your doctor.  Rest at home while you get better.  Watch your condition for any changes.  Take a warm bath to help with any burning or pain from having diarrhea.  Keep all follow-up visits as told by your doctor. This is important. Contact a doctor if:  You have a fever.  Your diarrhea gets worse.  You have new symptoms.  You cannot keep fluids down.  You feel light-headed or dizzy.  You have a headache.  You have muscle  cramps. Get help right away if:  You have chest pain.  You feel very weak or you pass out (faint).  You have bloody or black poop or poop that look like tar.  You have very bad pain, cramping, or bloating in your belly (abdomen).  You have trouble breathing or you are breathing very quickly.  Your heart is beating very quickly.  Your skin feels cold and clammy.  You feel confused.  You have signs of dehydration, such as: ? Dark pee, hardly any pee, or no pee. ? Cracked lips. ? Dry mouth. ? Sunken eyes. ? Sleepiness. ? Weakness. This information is not intended to replace advice given to you by your health care provider. Make sure you discuss any questions you have with your health care provider. Document Released: 01/19/2008 Document Revised: 02/20/2016 Document Reviewed: 04/08/2015 Elsevier Interactive Patient Education  2018 [employer]

## 2018-04-10 ENCOUNTER — Ambulatory Visit: Payer: Self-pay | Admitting: Internal Medicine

## 2018-04-10 NOTE — Telephone Encounter (Signed)
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 Pt pulled off the road and called the ambulance. His BP was 188/90. EMS told pt to drink some water and afterwards he stated he felt better and was able to continue to drive his truck.  Pt stated his BP was better Saturday. Today his BP 188/90, 162/112 and then BP went down to 136/94. Pt denies weakness, chest pain, headache, blurred vision, or difficulty breathing. Pt given care advice and pt verbalized understanding. Pt stated that he can only come to an appointment if it is on a Monday. Rebbeca Paulalled Vanessa at PCP office and she opened up an appointment 04/24/18 at 5:00 with Dr Darrick Huntsmanullo. Advised pt to keep a log of his BP's until his appointment. Reason for Disposition . [1] Systolic BP  >= 130 OR Diastolic >= 80 AND [2] taking BP medications  Answer Assessment - Initial Assessment Questions 1. BLOOD PRESSURE: "What is the blood pressure?" "Did you take at least two measurements 5 minutes apart?"     Saturday was 188/90 today BP 162/112  BP 136/94 2. ONSET: "When did you take your blood pressure?"  Saturday and today 3. HOW: "How did you obtain the blood pressure?" (e.g., visiting nurse, automatic home BP monitor)     Automatic BP monitor 4. HISTORY: "Do you have a history of high blood pressure?"     yes 5. MEDICATIONS: "Are you taking any medications for blood pressure?" "Have you missed any doses recently?"     Yes- no missed doses 6. OTHER SYMPTOMS: "Do you have any symptoms?" (e.g., headache, chest pain, blurred vision, difficulty breathing, weakness)     No symptoms 7. PREGNANCY: "Is there any chance you are pregnant?" "When was your last menstrual period?"     n/a  Protocols used: HIGH BLOOD PRESSURE-A-AH

## 2018-04-13 ENCOUNTER — Telehealth: Payer: Self-pay

## 2018-04-13 NOTE — Telephone Encounter (Signed)
Copied from CRM [id number]. Topic: General - Other >> Apr 13, 2018  2:33 PM Tamela OddiHarris, Brenda J wrote: Reason for CRM: Patient called to request the name of an antibiotic that he said was prescribed by Dr. Darrick Huntsmanullo a year or so ago.  He believes it was for a sinus infection and it worked very well.  He would like a call back with the name of the medication.  CB# [phone].

## 2018-04-14 NOTE — Telephone Encounter (Signed)
Spoke with pt and he stated that he actually called the pharmacy and they told him which antibiotic he had previously taken.

## 2018-04-24 ENCOUNTER — Ambulatory Visit: Payer: BLUE CROSS/BLUE SHIELD | Admitting: Internal Medicine

## 2018-04-24 ENCOUNTER — Encounter: Payer: Self-pay | Admitting: Internal Medicine

## 2018-04-24 ENCOUNTER — Other Ambulatory Visit: Payer: Self-pay

## 2018-04-24 VITALS — BP 136/82 | HR 93 | Temp 98.0°F | Wt 210.2 lb

## 2018-04-24 DIAGNOSIS — M79651 Pain in right thigh: Secondary | ICD-10-CM | POA: Diagnosis not present

## 2018-04-24 DIAGNOSIS — Z23 Encounter for immunization: Secondary | ICD-10-CM

## 2018-04-24 DIAGNOSIS — J301 Allergic rhinitis due to pollen: Secondary | ICD-10-CM

## 2018-04-24 DIAGNOSIS — I10 Essential (primary) hypertension: Secondary | ICD-10-CM | POA: Diagnosis not present

## 2018-04-24 DIAGNOSIS — F411 Generalized anxiety disorder: Secondary | ICD-10-CM

## 2018-04-24 MED ORDER — CELECOXIB 100 MG PO CAPS: 100.0000 mg | ORAL_CAPSULE | Freq: Two times a day (BID) | ORAL | 0 refills | Status: DC

## 2018-04-24 MED ORDER — CELECOXIB 100 MG PO CAPS: 100.0000 mg | ORAL_CAPSULE | Freq: Two times a day (BID) | ORAL | 5 refills | Status: DC

## 2018-04-24 MED ORDER — PAROXETINE HCL 10 MG PO TABS: 10.0000 mg | ORAL_TABLET | Freq: Every day | ORAL | 1 refills | Status: DC

## 2018-04-24 NOTE — Patient Instructions (Addendum)
You should suspend the zetia and the rosuvastatin while the taking terbinafine.   Resume them  ONE WEEK  after you have finished taking terbinafine   Please start the Paxil   at 1/2 tablet daily in the evening for the first few days to avoid nausea.  You can increase to a full tablet after 4 days if you havenot developed side effects of nausea.  If the lexapro interferes with your sleep, take it in the morning instead  Please return in  4 weeks ,  Or e mail me to let me know how it is helping your depression  For your allergies ,  You can use Benadryl  At night for the post nasal drip and continue  using of these newer second generation antihistamines that are longer acting, non sedating and  available OTC:  Generic  Zyrtec, which is cetirizine.    generic Allegra , available generically as fexofenadine ; comes in 60 mg and 180 mg once daily strengths.    Generic Claritin :  also available as loratidine .    Do not worry about BP unless home readings are consistently  > 130/80 .   I will  order an [place] of right leg to  Rule out  DVT

## 2018-04-24 NOTE — Progress Notes (Signed)
Subjective:  Patient ID: Ronald Cordova, male    DOB: 10/17/1965  Age: 52 y.o. MRN: [id number]  CC: The primary encounter diagnosis was Acute pain of right thigh. Diagnoses of Need for immunization against influenza, Anxiety state, Essential hypertension, Seasonal allergic rhinitis due to pollen, and Right thigh pain were also pertinent to this visit.  HPI Ronald Cordova presents for follow up on hypertension . Home readings have been elevated   Recently taking 2 antibiotics for dental surgery (3 root canals) : took clindamycin.  Had significant cramping diarrhea that lasted 5 days  which resolved ,  3rd canal used doxcycline   During the ordeal he developed a jittery feeling in his chest .  Called EMS BP was 188/90. Pulse was 88  Saw dermatology for ringworm .  Was given oral terbinafine,  Topical econazole 1%  14 days and other course planned    Right thigh pain for the last 4-5 weeks  Anteromedial.  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 clonazePAM (KLONOPIN) 1 MG tablet Take 1 tablet (1 mg total) by mouth 2 (two) times daily as needed for anxiety. 60 tablet 5  . cyclobenzaprine (FLEXERIL) 10 MG tablet Take 1 tablet (10 mg total) by mouth at bedtime. 60 tablet 1  . dexlansoprazole (DEXILANT) 60 MG capsule Take 1 capsule (60 mg total) by mouth daily. 90 capsule 1  . diphenhydrAMINE (BENADRYL) 25 MG tablet Take 25 mg by mouth every 6 (six) hours as needed.    Marland Kitchen econazole nitrate 1 % cream Apply topically daily.    Marland Kitchen ezetimibe (ZETIA) 10 MG tablet Take 1 tablet (10 mg total) by mouth daily. 90 tablet 3  . loratadine-pseudoephedrine (CLARITIN-D 12-HOUR) 5-120 MG tablet Take 1 tablet by mouth 2 (two) times daily.    Marland Kitchen losartan-hydrochlorothiazide (HYZAAR) 100-12.5 MG tablet Take 1 tablet by mouth daily. 90 tablet 1  . montelukast (SINGULAIR) 10 MG tablet Take 10 mg by mouth at  bedtime.    Marland Kitchen nystatin ointment (MYCOSTATIN) Apply 1 application topically 2 (two) times daily. 30 g 1  . rosuvastatin (CRESTOR) 5 MG tablet Take 1 tablet (5 mg total) by mouth daily. 90 tablet 1  . sildenafil (REVATIO) 20 MG tablet 3-5 po tablets daily as needed 90 tablet 1  . terbinafine (LAMISIL) 250 MG tablet Take 250 mg by mouth daily.    . traMADol (ULTRAM) 50 MG tablet TAKE 1 TABLET BY MOUTH EVERY 12 HOURS ASNEEDED FOR MODERATE PAIN 60 tablet 3  . Vitamin D, Ergocalciferol, (DRISDOL) 50000 units CAPS capsule TAKE ONE CAPSULE BY MOUTH ONE TIME PER WEEK 12 capsule 0  . celecoxib (CELEBREX) 100 MG capsule Take 100 mg by mouth 2 (two) times daily.    . Diclofenac Sodium (PENNSAID) 2 % SOLN Place 1 application onto the skin 2 (two) times daily. 1 Bottle 3  . venlafaxine (EFFEXOR) 37.5 MG tablet Take 1 tablet (37.5 mg total) by mouth 2 (two) times daily. 180 tablet 1   No facility-administered medications prior to visit.     Review of Systems;  Patient denies headache, fevers, malaise, unintentional weight loss, skin rash, eye pain, sinus congestion and sinus pain, sore throat, dysphagia,  hemoptysis , cough, dyspnea, wheezing, chest pain, palpitations, orthopnea, edema, abdominal pain, nausea, melena, diarrhea, constipation, flank pain, dysuria, hematuria, urinary  Frequency, nocturia, numbness,  tingling, seizures,  Focal weakness, Loss of consciousness,  Tremor, insomnia, depression, anxiety, and suicidal ideation.      Objective:  BP 136/82 (BP Location: Right Arm, Patient Position: Sitting, Cuff Size: Normal)   Pulse 93   Temp 98 F (36.7 C) (Oral)   Wt 210 lb 3.2 oz (95.3 kg)   SpO2 97%   BMI 30.16 kg/m   BP Readings from Last 3 Encounters:  04/24/18 136/82  04/07/18 140/80  12/12/17 116/64    Wt Readings from Last 3 Encounters:  04/24/18 210 lb 3.2 oz (95.3 kg)  04/07/18 213 lb 8 oz (96.8 kg)  12/12/17 214 lb 12.8 oz (97.4 kg)    General appearance: alert, cooperative  and appears stated age Ears: normal TM's and external ear canals both ears Throat: lips, mucosa, and tongue normal; teeth and gums normal Neck: no adenopathy, no carotid bruit, supple, symmetrical, trachea midline and thyroid not enlarged, symmetric, no tenderness/mass/nodules Back: symmetric, no curvature. ROM normal. No CVA tenderness. Lungs: clear to auscultation bilaterally Heart: regular rate and rhythm, S1, S2 normal, no murmur, click, rub or gallop Abdomen: soft, non-tender; bowel sounds normal; no masses,  no organomegaly Pulses: 2+ and symmetric Skin: Skin color, texture, turgor normal. No rashes or lesions Lymph nodes: Cervical, supraclavicular, and axillary nodes normal.  No results found for: HGBA1C  Lab Results  Component Value Date   CREATININE 0.90 01/02/2018   CREATININE 0.93 11/14/2017   CREATININE 0.95 04/13/2017    Lab Results  Component Value Date   WBC 7.6 03/03/2017   HGB 16.1 03/03/2017   HCT 47.1 03/03/2017   PLT 270 03/03/2017   GLUCOSE 97 01/02/2018   CHOL 125 01/02/2018   TRIG 103.0 01/02/2018   HDL 34.50 (L) 01/02/2018   LDLDIRECT 146.0 07/28/2015   LDLCALC 70 01/02/2018   ALT 34 01/02/2018   AST 16 01/02/2018   NA 142 01/02/2018   K 4.3 01/02/2018   CL 106 01/02/2018   CREATININE 0.90 01/02/2018   BUN 11 01/02/2018   CO2 28 01/02/2018   TSH 0.76 12/20/2016   PSA 0.69 02/28/2017    Dg Chest 2 View  Result Date: 03/03/2017 CLINICAL DATA:  Chest pain EXAM: CHEST  2 VIEW COMPARISON:  10/28/14 FINDINGS: The heart size and mediastinal contours are within normal limits. Both lungs are clear. The visualized skeletal structures are unremarkable. IMPRESSION: No active cardiopulmonary disease. Electronically Signed   By: Alcide Clever M.D.   On: 03/03/2017 10:34   Ct Renal Stone Study  Result Date: 03/03/2017 CLINICAL DATA:  Right flank pain/lower quadrant tenderness since last week. EXAM: CT ABDOMEN AND PELVIS WITHOUT CONTRAST TECHNIQUE:  Multidetector CT imaging of the abdomen and pelvis was performed following the standard protocol without IV contrast. COMPARISON:  Abdominal ultrasound 12/02/2015 FINDINGS: Lower chest: Minimal scarring or atelectasis in the lingula. No pleural effusion. Hepatobiliary: Slightly decreased attenuation of the liver compared to the spleen compatible with steatosis. Unremarkable gallbladder. No biliary dilatation. Pancreas: Unremarkable. Spleen: Unremarkable. Adrenals/Urinary Tract: Unremarkable adrenal glands. No evidence of renal mass, hydronephrosis, or urinary tract calculi. Unremarkable bladder. Stomach/Bowel: The stomach is within normal limits. There is no evidence of bowel obstruction or inflammation. Scattered sigmoid colon diverticula are noted without evidence of diverticulitis. Prior appendectomy. Vascular/Lymphatic: Incidental retroaortic left renal vein. Mild aortoiliac atherosclerosis without aneurysm. No enlarged lymph nodes. Reproductive: Unremarkable prostate. Other: No intraperitoneal free fluid. No abdominal wall mass or hernia. Musculoskeletal: Mild thoracolumbar spondylosis. No acute osseous abnormality or suspicious osseous lesion  IMPRESSION: 1. No acute abnormality identified in the abdomen or pelvis. 2. Hepatic steatosis. 3. Mild sigmoid colon diverticulosis. 4.  Aortic Atherosclerosis (ICD10-I70.0). Electronically Signed   By: Sebastian Ache M.D.   On: 03/03/2017 13:45    Assessment & Plan:   Problem List Items Addressed This Visit    Allergic rhinitis    Recommend use of Benadryl at night and a 2nd generation antihistamine for daytime use.       Anxiety state    Adding lexapro starting at 5 gm daily.  rtc one month      Relevant Medications   PARoxetine (PAXIL) 10 MG tablet   Essential hypertension    Reassurance given,  Will adjust meds for readings < 130/80      Right thigh pain    Given his risk for DVT ,  ultrasound ordered        Other Visit Diagnoses    Acute pain  of right thigh    -  Primary   Relevant Orders   US Venous Img Lower Unilateral Right   Need for immunization against influenza       Relevant Orders   Flu Vaccine QUAD 36+ mos IM (Completed)      I have discontinued Graylin Shiver. Velarde's Diclofenac Sodium and venlafaxine. I have also changed his celecoxib and celecoxib. Additionally, I am having him start on PARoxetine. Lastly, I am having him maintain his montelukast, cyclobenzaprine, sildenafil, clonazePAM, nystatin ointment, ezetimibe, dexlansoprazole, losartan-hydrochlorothiazide, rosuvastatin, Vitamin D (Ergocalciferol), traMADol, econazole nitrate, terbinafine, diphenhydrAMINE, and loratadine-pseudoephedrine.  Meds ordered this encounter  Medications  . celecoxib (CELEBREX) 100 MG capsule    Sig: Take 1 capsule (100 mg total) by mouth 2 (two) times daily.    Dispense:  60 capsule    Refill:  5  . PARoxetine (PAXIL) 10 MG tablet    Sig: Take 1 tablet (10 mg total) by mouth daily. After dinner    Dispense:  30 tablet    Refill:  1  . celecoxib (CELEBREX) 100 MG capsule    Sig: Take 1 capsule (100 mg total) by mouth 2 (two) times daily.    Dispense:  60 capsule    Refill:  0   A total of 25 minutes of face to face time was spent with patient more than half of which was spent in counselling about the above mentioned conditions  and coordination of care   Medications Discontinued During This Encounter  Medication Reason  . celecoxib (CELEBREX) 100 MG capsule Reorder  . Diclofenac Sodium (PENNSAID) 2 % SOLN   . venlafaxine (EFFEXOR) 37.5 MG tablet     Follow-up: No follow-ups on file.   Sherlene Shams, MD

## 2018-04-25 DIAGNOSIS — M79651 Pain in right thigh: Secondary | ICD-10-CM | POA: Insufficient documentation

## 2018-04-25 DIAGNOSIS — J309 Allergic rhinitis, unspecified: Secondary | ICD-10-CM | POA: Insufficient documentation

## 2018-04-25 NOTE — Assessment & Plan Note (Signed)
Recommend use of Benadryl at night and a 2nd generation antihistamine for daytime use.

## 2018-04-25 NOTE — Assessment & Plan Note (Signed)
Given his risk for DVT ,  ultrasound ordered

## 2018-04-25 NOTE — Assessment & Plan Note (Signed)
Reassurance given,  Will adjust meds for readings < 130/80

## 2018-04-25 NOTE — Assessment & Plan Note (Signed)
Adding lexapro starting at 5 gm daily.  rtc one month

## 2018-04-28 ENCOUNTER — Other Ambulatory Visit: Payer: Self-pay | Admitting: Internal Medicine

## 2018-04-30 ENCOUNTER — Other Ambulatory Visit: Payer: Self-pay | Admitting: Internal Medicine

## 2018-05-03 ENCOUNTER — Other Ambulatory Visit: Payer: Self-pay | Admitting: *Deleted

## 2018-05-03 DIAGNOSIS — R079 Chest pain, unspecified: Secondary | ICD-10-CM

## 2018-05-03 NOTE — Progress Notes (Signed)
Patient came in to get information on CT Calcium scoring and is now ready to reschedule this test. Provided him with information sheet and numbers to call for scheduling.

## 2018-05-29 ENCOUNTER — Ambulatory Visit (HOSPITAL_COMMUNITY)
Admission: RE | Admit: 2018-05-29 | Discharge: 2018-05-29 | Disposition: A | Discharge status: Home / Self Care | Payer: BLUE CROSS/BLUE SHIELD | Source: Ambulatory Visit | Attending: Cardiovascular Disease | Admitting: Cardiovascular Disease

## 2018-05-29 ENCOUNTER — Ambulatory Visit (INDEPENDENT_AMBULATORY_CARE_PROVIDER_SITE_OTHER)
Admission: RE | Admit: 2018-05-29 | Discharge: 2018-05-29 | Disposition: A | Discharge status: Home / Self Care | Payer: BLUE CROSS/BLUE SHIELD | Source: Ambulatory Visit | Attending: Cardiovascular Disease | Admitting: Cardiovascular Disease

## 2018-05-29 ENCOUNTER — Other Ambulatory Visit: Payer: Self-pay | Admitting: Internal Medicine

## 2018-05-29 DIAGNOSIS — M79606 Pain in leg, unspecified: Secondary | ICD-10-CM

## 2018-05-29 DIAGNOSIS — M79604 Pain in right leg: Secondary | ICD-10-CM | POA: Diagnosis not present

## 2018-05-29 DIAGNOSIS — E782 Mixed hyperlipidemia: Secondary | ICD-10-CM

## 2018-05-31 ENCOUNTER — Telehealth: Payer: Self-pay | Admitting: Cardiovascular Disease

## 2018-05-31 NOTE — Telephone Encounter (Signed)
New Message           Patient called for results and would like a call back. Thank you

## 2018-05-31 NOTE — Telephone Encounter (Signed)
Spoke with patient and reviewed that results are still pending provider review and that preliminary results looked good. He verbalized understanding with no further questions at this time.

## 2018-06-02 ENCOUNTER — Telehealth: Payer: Self-pay | Admitting: Internal Medicine

## 2018-06-02 MED ORDER — PAROXETINE HCL 20 MG PO TABS: 20.0000 mg | ORAL_TABLET | Freq: Every day | ORAL | 0 refills | Status: DC

## 2018-06-02 NOTE — Addendum Note (Signed)
Addended by: Sherlene Shams on: 06/02/2018 05:28 PM   Modules accepted: Orders

## 2018-06-02 NOTE — Telephone Encounter (Signed)
I have increased the dose to 20 mg as you requested.   MyChart message sent

## 2018-06-02 NOTE — Telephone Encounter (Signed)
Copied from CRM [id number]. Topic: General - Other >> Jun 02, 2018  1:27 PM Gerrianne Scale wrote: Reason for CRM: pt calling wanting to know if he can go up to 20 mg on the PARoxetine (PAXIL) 10 MG tablet he states that he has tolerated it well please call pt at [phone]

## 2018-06-06 ENCOUNTER — Other Ambulatory Visit: Payer: Self-pay | Admitting: Internal Medicine

## 2018-06-06 MED ORDER — PAROXETINE HCL 20 MG PO TABS: 20.0000 mg | ORAL_TABLET | Freq: Every day | ORAL | 2 refills | Status: DC

## 2018-06-06 NOTE — Telephone Encounter (Signed)
Copied from CRM [id number]. Topic: Quick Communication - Rx Refill/Question >> Jun 06, 2018  3:12 PM Mcneil, Jannifer Rodney wrote: Pt states he needs the Rx for PARoxetine (PAXIL) 20 MG tablet [facility]   Medication: PARoxetine (PAXIL) 20 MG tablet  Has the patient contacted their pharmacy? yes   Preferred Pharmacy (with phone number or street name): [facility] [place], [place] - [address] AT [facility] [phone] (Phone) [phone] (Fax)  Agent: Please be advised that RX refills may take up to 3 business days. We ask that you follow-up with your pharmacy.

## 2018-06-27 ENCOUNTER — Ambulatory Visit: Payer: BLUE CROSS/BLUE SHIELD

## 2018-06-28 ENCOUNTER — Ambulatory Visit (INDEPENDENT_AMBULATORY_CARE_PROVIDER_SITE_OTHER): Payer: BLUE CROSS/BLUE SHIELD | Admitting: *Deleted

## 2018-06-28 DIAGNOSIS — Z23 Encounter for immunization: Secondary | ICD-10-CM

## 2018-06-29 ENCOUNTER — Other Ambulatory Visit: Payer: Self-pay | Admitting: Internal Medicine

## 2018-06-29 ENCOUNTER — Telehealth: Payer: Self-pay | Admitting: Internal Medicine

## 2018-06-29 DIAGNOSIS — E559 Vitamin D deficiency, unspecified: Secondary | ICD-10-CM

## 2018-06-29 DIAGNOSIS — K760 Fatty (change of) liver, not elsewhere classified: Secondary | ICD-10-CM

## 2018-06-29 DIAGNOSIS — E782 Mixed hyperlipidemia: Secondary | ICD-10-CM

## 2018-06-29 NOTE — Telephone Encounter (Signed)
Patient came into office for Hep A/B while in office  He  Had question patient has completed the Lamisil for skin condition he has returned to taking his Crestor daily for the last two weeks, he stated PCP wanted him to have LFTs and he is requesting to have vitamin D level done . Patient is on vacation the rest of this week and next ok for patient to have labs. Last Vit D 21.92 01/02/18, Lipid and cmet same date. I have pended Hepatic function and Vit d .

## 2018-07-06 ENCOUNTER — Other Ambulatory Visit: Payer: Self-pay | Admitting: Internal Medicine

## 2018-07-06 NOTE — Telephone Encounter (Signed)
Last OV 04/24/2018   Last refilled 12/12/2017 disp 60 with 5 refills   Sent to PCP for approval

## 2018-07-21 ENCOUNTER — Other Ambulatory Visit: Payer: Self-pay | Admitting: Internal Medicine

## 2018-08-28 ENCOUNTER — Ambulatory Visit (INDEPENDENT_AMBULATORY_CARE_PROVIDER_SITE_OTHER): Payer: BLUE CROSS/BLUE SHIELD | Admitting: Internal Medicine

## 2018-08-28 VITALS — BP 128/88 | HR 82 | Temp 98.4°F | Resp 15 | Ht 70.0 in | Wt 226.2 lb

## 2018-08-28 DIAGNOSIS — Z113 Encounter for screening for infections with a predominantly sexual mode of transmission: Secondary | ICD-10-CM | POA: Diagnosis not present

## 2018-08-28 DIAGNOSIS — I10 Essential (primary) hypertension: Secondary | ICD-10-CM | POA: Diagnosis not present

## 2018-08-28 DIAGNOSIS — E782 Mixed hyperlipidemia: Secondary | ICD-10-CM | POA: Diagnosis not present

## 2018-08-28 DIAGNOSIS — L659 Nonscarring hair loss, unspecified: Secondary | ICD-10-CM

## 2018-08-28 DIAGNOSIS — R5383 Other fatigue: Secondary | ICD-10-CM

## 2018-08-28 DIAGNOSIS — M546 Pain in thoracic spine: Secondary | ICD-10-CM

## 2018-08-28 DIAGNOSIS — F411 Generalized anxiety disorder: Secondary | ICD-10-CM

## 2018-08-28 DIAGNOSIS — Z125 Encounter for screening for malignant neoplasm of prostate: Secondary | ICD-10-CM | POA: Diagnosis not present

## 2018-08-28 DIAGNOSIS — G8929 Other chronic pain: Secondary | ICD-10-CM

## 2018-08-28 DIAGNOSIS — E669 Obesity, unspecified: Secondary | ICD-10-CM

## 2018-08-28 DIAGNOSIS — G5622 Lesion of ulnar nerve, left upper limb: Secondary | ICD-10-CM

## 2018-08-28 DIAGNOSIS — R7989 Other specified abnormal findings of blood chemistry: Secondary | ICD-10-CM

## 2018-08-28 DIAGNOSIS — G44039 Episodic paroxysmal hemicrania, not intractable: Secondary | ICD-10-CM

## 2018-08-28 DIAGNOSIS — E538 Deficiency of other specified B group vitamins: Secondary | ICD-10-CM

## 2018-08-28 DIAGNOSIS — R079 Chest pain, unspecified: Secondary | ICD-10-CM

## 2018-08-28 DIAGNOSIS — R51 Headache: Secondary | ICD-10-CM

## 2018-08-28 DIAGNOSIS — E559 Vitamin D deficiency, unspecified: Secondary | ICD-10-CM

## 2018-08-28 DIAGNOSIS — Z1211 Encounter for screening for malignant neoplasm of colon: Secondary | ICD-10-CM

## 2018-08-28 DIAGNOSIS — M545 Low back pain: Secondary | ICD-10-CM

## 2018-08-28 DIAGNOSIS — R519 Headache, unspecified: Secondary | ICD-10-CM

## 2018-08-28 DIAGNOSIS — F331 Major depressive disorder, recurrent, moderate: Secondary | ICD-10-CM

## 2018-08-28 LAB — COMPREHENSIVE METABOLIC PANEL
ALBUMIN: 4.4 g/dL (ref 3.5–5.2)
ALT: 33 U/L (ref 0–53)
AST: 24 U/L (ref 0–37)
Alkaline Phosphatase: 46 U/L (ref 39–117)
BUN: 11 mg/dL (ref 6–23)
CHLORIDE: 104 meq/L (ref 96–112)
CO2: 27 mEq/L (ref 19–32)
CREATININE: 0.91 mg/dL (ref 0.40–1.50)
Calcium: 9.7 mg/dL (ref 8.4–10.5)
GFR: 93.02 mL/min (ref >60.00)
Glucose, Bld: 91 mg/dL (ref 70–99)
Potassium: 4.1 mEq/L (ref 3.5–5.1)
SODIUM: 139 meq/L (ref 135–145)
TOTAL PROTEIN: 6.8 g/dL (ref 6.0–8.3)
Total Bilirubin: 0.9 mg/dL (ref 0.2–1.2)

## 2018-08-28 LAB — LIPID PANEL
CHOLESTEROL: 117 mg/dL (ref 0–200)
HDL: 36.7 mg/dL — ABNORMAL LOW (ref >39.00)
LDL CALC: 61 mg/dL (ref 0–99)
NONHDL: 79.87
Total CHOL/HDL Ratio: 3
Triglycerides: 92 mg/dL (ref 0.0–149.0)
VLDL: 18.4 mg/dL (ref 0.0–40.0)

## 2018-08-28 LAB — PSA: PSA: 0.51 ng/mL (ref 0.10–4.00)

## 2018-08-28 LAB — TSH: TSH: 0.3 u[IU]/mL — AB (ref 0.35–4.50)

## 2018-08-28 LAB — B12 AND FOLATE PANEL
Folate: 19.8 ng/mL (ref >5.9)
Vitamin B-12: 260 pg/mL (ref 211–911)

## 2018-08-28 LAB — VITAMIN D 25 HYDROXY (VIT D DEFICIENCY, FRACTURES): VITD: 19.72 ng/mL — ABNORMAL LOW (ref 30.00–100.00)

## 2018-08-28 MED ORDER — TRAMADOL HCL 50 MG PO TABS: 50.0000 mg | ORAL_TABLET | Freq: Two times a day (BID) | ORAL | 5 refills | Status: DC

## 2018-08-28 MED ORDER — CYCLOBENZAPRINE HCL 10 MG PO TABS: 10.0000 mg | ORAL_TABLET | Freq: Three times a day (TID) | ORAL | 5 refills | Status: DC | PRN

## 2018-08-28 MED ORDER — CELECOXIB 100 MG PO CAPS: 100.0000 mg | ORAL_CAPSULE | Freq: Two times a day (BID) | ORAL | 5 refills | Status: DC

## 2018-08-28 MED ORDER — CLONAZEPAM 1 MG PO TABS: 0.5000 mg | ORAL_TABLET | Freq: Two times a day (BID) | ORAL | 5 refills | Status: DC

## 2018-08-28 NOTE — Assessment & Plan Note (Signed)
celebrex and tylenol prescribed. Using Tramadol for moderate pain last refill was

## 2018-08-28 NOTE — Assessment & Plan Note (Signed)
Calcium score was zero,  68th percentile by CT cardiac scoring Oct 2019

## 2018-08-28 NOTE — Patient Instructions (Signed)
I AM ORDERING AN MRI OF YOUR BRAIN, FOLLOWED BY NEUROLOGY CONSULT    Exercising to Lose Weight Exercise is structured, repetitive physical activity to improve fitness and health. Getting regular exercise is important for everyone. It is especially important if you are overweight. Being overweight increases your risk of heart disease, stroke, diabetes, high blood pressure, and several types of cancer. Reducing your calorie intake and exercising can help you lose weight. Exercise is usually categorized as moderate or vigorous intensity. To lose weight, most people need to do a certain amount of moderate-intensity or vigorous-intensity exercise each week. Moderate-intensity exercise  Moderate-intensity exercise is any activity that gets you moving enough to burn at least three times more energy (calories) than if you were sitting. Examples of moderate exercise include:  Walking a mile in 15 minutes.  Doing light yard work.  Biking at an easy pace. Most people should get at least 150 minutes (2 hours and 30 minutes) a week of moderate-intensity exercise to maintain their body weight. Vigorous-intensity exercise Vigorous-intensity exercise is any activity that gets you moving enough to burn at least six times more calories than if you were sitting. When you exercise at this intensity, you should be working hard enough that you are not able to carry on a conversation. Examples of vigorous exercise include:  Running.  Playing a team sport, such as football, basketball, and soccer.  Jumping rope. Most people should get at least 75 minutes (1 hour and 15 minutes) a week of vigorous-intensity exercise to maintain their body weight. How can exercise affect me? When you exercise enough to burn more calories than you eat, you lose weight. Exercise also reduces body fat and builds muscle. The more muscle you have, the more calories you burn. Exercise also:  Improves mood.  Reduces stress and  tension.  Improves your overall fitness, flexibility, and endurance.  Increases bone strength. The amount of exercise you need to lose weight depends on:  Your age.  The type of exercise.  Any health conditions you have.  Your overall physical ability. Talk to your health care provider about how much exercise you need and what types of activities are safe for you. What actions can I take to lose weight? Nutrition   Make changes to your diet as told by your health care provider or diet and nutrition specialist (dietitian). This may include: ? Eating fewer calories. ? Eating more protein. ? Eating less unhealthy fats. ? Eating a diet that includes fresh fruits and vegetables, whole grains, low-fat dairy products, and lean protein. ? Avoiding foods with added fat, salt, and sugar.  Drink plenty of water while you exercise to prevent dehydration or heat stroke. Activity  Choose an activity that you enjoy and set realistic goals. Your health care provider can help you make an exercise plan that works for you.  Exercise at a moderate or vigorous intensity most days of the week. ? The intensity of exercise may vary from person to person. You can tell how intense a workout is for you by paying attention to your breathing and heartbeat. Most people will notice their breathing and heartbeat get faster with more intense exercise.  Do resistance training twice each week, such as: ? Push-ups. ? Sit-ups. ? Lifting weights. ? Using resistance bands.  Getting short amounts of exercise can be just as helpful as long structured periods of exercise. If you have trouble finding time to exercise, try to include exercise in your daily routine. ? Get  up, stretch, and walk around every 30 minutes throughout the day. ? Go for a walk during your lunch break. ? Park your car farther away from your destination. ? If you take public transportation, get off one stop early and walk the rest of the  way. ? Make phone calls while standing up and walking around. ? Take the stairs instead of elevators or escalators.  Wear comfortable clothes and shoes with good support.  Do not exercise so much that you hurt yourself, feel dizzy, or get very short of breath. Where to find more information  U.S. Department of Health and Human Services: ThisPath.fi  Centers for Disease Control and Prevention (CDC): FootballExhibition.com.br Contact a health care provider:  Before starting a new exercise program.  If you have questions or concerns about your weight.  If you have a medical problem that keeps you from exercising. Get help right away if you have any of the following while exercising:  Injury.  Dizziness.  Difficulty breathing or shortness of breath that does not go away when you stop exercising.  Chest pain.  Rapid heartbeat. Summary  Being overweight increases your risk of heart disease, stroke, diabetes, high blood pressure, and several types of cancer.  Losing weight happens when you burn more calories than you eat.  Reducing the amount of calories you eat in addition to getting regular moderate or vigorous exercise each week helps you lose weight. This information is not intended to replace advice given to you by your health care provider. Make sure you discuss any questions you have with your health care provider. Document Released: 09/04/2010 Document Revised: 08/15/2017 Document Reviewed: 08/15/2017 Elsevier Interactive Patient Education  2019 [employer].

## 2018-08-28 NOTE — Progress Notes (Signed)
Subjective:  Patient ID: Ronald Cordova, male    DOB: 04-22-1966  Age: 53 y.o. MRN: [id number]  CC: The primary encounter diagnosis was Prostate cancer screening. Diagnoses of Screen for STD (sexually transmitted disease), Essential hypertension, Mixed hyperlipidemia, Chest pain with moderate risk for cardiac etiology, Alopecia, Vitamin D deficiency, Fatigue, unspecified type, Episodic paroxysmal hemicrania, not intractable, Frontal headache, Chronic bilateral thoracic back pain, Chronic right-sided low back pain without sciatica, Moderate episode of recurrent major depressive disorder (HCC), Colon cancer screening, Ulnar neuropathy at elbow of left upper extremity, New onset of headaches after age 79, Anxiety state, and Obesity (BMI 30.0-34.9) were also pertinent to this visit.  HPI [name] presents for follow up on GAD , hyperlipidemia and overweight/obesity. He has multiple new issues to discuss today.  A total of 40 minutes was spent with patient more than half of which was spent in counseling patient on the above mentioned issues , reviewing and explaining recent labs and imaging studies done, and coordination of care.  1) Recurrent right frontal HA occurring for the past year, averages 2-3 times per week.  acc'd by abnormal sense of smell on right and nausea.  Saw ENT and had fiberoptic exam  Of both sinuses,  Right side was clear,  Left side had polyps (6) . He made the mistake of "googling" his symptoms  Now afraid he has parkinson's and wants to see a neurologist.  He has no history of migraines or cancer. There is no  family history of Parkinson's/  He has not  had an MRI  Yet . Needs it on a Monday , either in [place] or [place]    2) Hyperlipidemia :  crestor suspended while using lamisil to treat tinea corporis involving left forearm.  He finished lamisil in November and the rash has resolved completely. Marland Kitchen  Has resumed crestor but not had lfts done  Yet, noninvasive risk  stratification was done with a  CT Cardiac scoring done in October  2019. Calcium   Score was 14 , 68th percentile  (elevated)  with punctate calcium noted in proximal mid and distal LAD       3) Obesity:   Weighed 228 in Jan 2019. In Sept  he was Down 18 lbs, now 226  Due to lack of exercise and non adherence to low GI diet.    4) MID BACK PAIN .  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   Using tramadol , along with celebrex .  5) LEFT ARM /HAND PAIN MANAGED BY  HAND SURGEON WITH A steroid NJECTION IN THE PALM BASE.  Attributed to ulnar neuroapthy , injectio provided pain relief for nearly  A YEAR BUT PAIN HAS RETURENED   6) shoulder pain:  S/p I/A steroid injection in LEFT SHOULDER by Orthopedics.  Followed by eval with Dr Jerilynn Som the chiropractor who treated him for shoulder dislocation byu "popping it ack into position "   7) has been having nocturnal  HOT FLASHES  OCCURRING FOR THE PAST 6 MONTHS , sometimes occurring during the daytime.  No weight loss, fevers,  Or cough.  No history of exposure ,  Not sexually active .  Has no SEX DRIVE.  DOES NOT WANT TO STOP PAXIL.    8) GAD: chronic aggravated by lifestyle and lack of partner.  Taking paxil and clonazepam .    Lab Results  Component Value Date   PSA  0.51 08/28/2018   PSA 0.69 02/28/2017    Outpatient Medications Prior to Visit  Medication Sig Dispense Refill  . dexlansoprazole (DEXILANT) 60 MG capsule Take 1 capsule (60 mg total) by mouth daily. 90 capsule 1  . diphenhydrAMINE (BENADRYL) 25 MG tablet Take 25 mg by mouth every 6 (six) hours as needed.    Marland Kitchen econazole nitrate 1 % cream Apply topically daily.    Marland Kitchen ezetimibe (ZETIA) 10 MG tablet Take 1 tablet (10 mg total) by mouth daily. 90 tablet 3  . loratadine-pseudoephedrine (CLARITIN-D 12-HOUR) 5-120 MG tablet Take 1 tablet by mouth 2 (two) times daily.    Marland Kitchen losartan-hydrochlorothiazide (HYZAAR) 100-12.5  MG tablet Take 1 tablet by mouth daily. 90 tablet 1  . montelukast (SINGULAIR) 10 MG tablet Take 10 mg by mouth at bedtime.    Marland Kitchen nystatin ointment (MYCOSTATIN) Apply 1 application topically 2 (two) times daily. 30 g 1  . PARoxetine (PAXIL) 20 MG tablet Take 1 tablet (20 mg total) by mouth daily. After dinner 90 tablet 2  . rosuvastatin (CRESTOR) 5 MG tablet Take 1 tablet (5 mg total) by mouth daily. 90 tablet 1  . sildenafil (REVATIO) 20 MG tablet 3-5 po tablets daily as needed 90 tablet 1  . Vitamin D, Ergocalciferol, (DRISDOL) 50000 units CAPS capsule TAKE ONE CAPSULE BY MOUTH ONE TIME PER WEEK 12 capsule 0  . celecoxib (CELEBREX) 100 MG capsule Take 1 capsule (100 mg total) by mouth 2 (two) times daily. 60 capsule 5  . celecoxib (CELEBREX) 100 MG capsule Take 1 capsule (100 mg total) by mouth 2 (two) times daily. 60 capsule 0  . clonazePAM (KLONOPIN) 1 MG tablet TAKE 1 TABLET BY MOUTH DAILY 30 tablet 3  . cyclobenzaprine (FLEXERIL) 10 MG tablet Take 1 tablet (10 mg total) by mouth at bedtime. 60 tablet 1  . PARoxetine (PAXIL) 10 MG tablet TAKE ONE TABLET BY MOUTH DAILY AFTER DINNER 30 tablet 1  . terbinafine (LAMISIL) 250 MG tablet Take 250 mg by mouth daily.    . traMADol (ULTRAM) 50 MG tablet TAKE 1 TABLET BY MOUTH EVERY 12 HOURS ASNEEDED FOR MODERATE PAIN 60 tablet 3   No facility-administered medications prior to visit.     Review of Systems;  Patient denies headache, fevers, malaise, unintentional weight loss, skin rash, eye pain, sinus congestion and sinus pain, sore throat, dysphagia,  hemoptysis , cough, dyspnea, wheezing, chest pain, palpitations, orthopnea, edema, abdominal pain, nausea, melena, diarrhea, constipation, flank pain, dysuria, hematuria, urinary  Frequency, nocturia, numbness, tingling, seizures,  Focal weakness, Loss of consciousness,  Tremor, insomnia, depression, anxiety, and suicidal ideation.      Objective:  BP 128/88 (BP Location: Left Arm, Patient Position:  Sitting, Cuff Size: Large)   Pulse 82   Temp 98.4 F (36.9 C) (Oral)   Resp 15   Ht 5\' 10"  (1.778 m)   Wt 226 lb 3.2 oz (102.6 kg)   SpO2 95%   BMI 32.46 kg/m   BP Readings from Last 3 Encounters:  08/28/18 128/88  04/24/18 136/82  04/07/18 140/80    Wt Readings from Last 3 Encounters:  08/28/18 226 lb 3.2 oz (102.6 kg)  04/24/18 210 lb 3.2 oz (95.3 kg)  04/07/18 213 lb 8 oz (96.8 kg)    General appearance: alert, cooperative and appears stated age Ears: normal TM's and external ear canals both ears Throat: lips, mucosa, and tongue normal; teeth and gums normal Neck: no adenopathy, no carotid bruit, supple, symmetrical, trachea midline  and thyroid not enlarged, symmetric, no tenderness/mass/nodules Back: symmetric, no curvature. ROM normal. No CVA tenderness. Lungs: clear to auscultation bilaterally Heart: regular rate and rhythm, S1, S2 normal, no murmur, click, rub or gallop Abdomen: soft, non-tender; bowel sounds normal; no masses,  no organomegaly Pulses: 2+ and symmetric Skin: Skin color, texture, turgor normal. No rashes or lesions Lymph nodes: Cervical, supraclavicular, and axillary nodes normal.  No results found for: HGBA1C  Lab Results  Component Value Date   CREATININE 0.91 08/28/2018   CREATININE 0.90 01/02/2018   CREATININE 0.93 11/14/2017    Lab Results  Component Value Date   WBC 7.6 03/03/2017   HGB 16.1 03/03/2017   HCT 47.1 03/03/2017   PLT 270 03/03/2017   GLUCOSE 91 08/28/2018   CHOL 117 08/28/2018   TRIG 92.0 08/28/2018   HDL 36.70 (L) 08/28/2018   LDLDIRECT 146.0 07/28/2015   LDLCALC 61 08/28/2018   ALT 33 08/28/2018   AST 24 08/28/2018   NA 139 08/28/2018   K 4.1 08/28/2018   CL 104 08/28/2018   CREATININE 0.91 08/28/2018   BUN 11 08/28/2018   CO2 27 08/28/2018   TSH 0.30 (L) 08/28/2018   PSA 0.51 08/28/2018    Ct Cardiac Scoring  Addendum Date: 05/29/2018   ADDENDUM REPORT: 05/29/2018 15:02 EXAM: OVER-READ INTERPRETATION   CT CHEST The following report is an over-read performed by radiologist Dr. Noe GensKevin Dover[facility] on 05/29/2018. This over-read does not include interpretation of cardiac or coronary anatomy or pathology. The coronary calcium score interpretation by the cardiologist is attached. COMPARISON:  Chest CT 02/18/2012 FINDINGS: Heart is normal size. Visualized aorta is normal caliber. No adenopathy in the lower mediastinum or hila. The visualized lungs are clear. No effusions. Imaging into the upper abdomen shows no acute findings. Chest wall soft tissues are unremarkable. No acute bony abnormality. IMPRESSION: No acute or significant extracardiac abnormality. Electronically Signed   By: Charlett NoseKevin  Dover M.D.   On: 05/29/2018 15:02   Result Date: 05/29/2018 CLINICAL DATA:  Risk stratification EXAM: Coronary Calcium Score TECHNIQUE: The patient was scanned on a Siemens Somatom 64 slice scanner. Axial non-contrast 3mm slices were carried out through the heart. The data set was analyzed on a dedicated work station and scored using the Agatson method. FINDINGS: Non-cardiac: No significant non cardiac findings on limited lung and soft tissue windows. See separate [facility]. Ascending Aorta: Normal diameter 3.0 cm Pericardium: Normal Coronary arteries: Punctate calcium noted in proximal mid and distal LAD IMPRESSION: Coronary calcium score of 14. This was 4068 th percentile for age and sex matched control. Charlton HawsPeter Nishan Electronically Signed: By: Charlton HawsPeter  Nishan M.D. On: 05/29/2018 14:38   Vas [place] Lower Extremity Venous (dvt)  Result Date: 05/29/2018  Lower Venous Study Other Indications: Patient complains of intermittent right thigh pain for                    several weeks. He denies any shortness of breath. Risk Factors: None identified. Performing Technologist: Dondra PraderGina Gealow RVT  Examination Guidelines: A complete evaluation includes B-mode imaging, spectral Doppler, color Doppler, and  power Doppler as needed of all accessible portions of each vessel. Bilateral testing is considered an integral part of a complete examination. Limited examinations for reoccurring indications may be performed as noted.  Right Venous Findings: +---------+---------------+---------+-----------+----------+-------+          CompressibilityPhasicitySpontaneityPropertiesSummary +---------+---------------+---------+-----------+----------+-------+ CFV      Full  Yes      Yes                          +---------+---------------+---------+-----------+----------+-------+ SFJ      Full           Yes      Yes                          +---------+---------------+---------+-----------+----------+-------+ FV Prox  Full           Yes      Yes                          +---------+---------------+---------+-----------+----------+-------+ FV Mid   Full           Yes      Yes                          +---------+---------------+---------+-----------+----------+-------+ FV DistalFull           Yes      Yes                          +---------+---------------+---------+-----------+----------+-------+ PFV      Full                                                 +---------+---------------+---------+-----------+----------+-------+ POP      Full           Yes      Yes                          +---------+---------------+---------+-----------+----------+-------+ PTV      Full           Yes      Yes                          +---------+---------------+---------+-----------+----------+-------+ PERO     Full           Yes      Yes                          +---------+---------------+---------+-----------+----------+-------+ Gastroc  Full                                                 +---------+---------------+---------+-----------+----------+-------+ GSV      Full           Yes      Yes                           +---------+---------------+---------+-----------+----------+-------+  Summary: Right: No evidence of deep vein thrombosis in the lower extremity. No indirect evidence of obstruction proximal to the inguinal ligament. No cystic structure found in the popliteal fossa. Left: No evidence of common femoral vein obstruction.  *See table(s) above for measurements and observations. Electronically signed by Nanetta Batty MD on 05/29/2018 at 3:53:42 PM.    Final     Assessment & Plan:  Problem List Items Addressed This Visit    Anxiety state    Continue Paxil.  Advised NOT to increzse use of clonazepam to moe than 1 mg daily       Chest pain with moderate risk for cardiac etiology    Calcium score was zero,  68th percentile by CT cardiac scoring Oct 2019      Chronic bilateral thoracic back pain    celebrex and tylenol prescribed. Using Tramadol for moderate pain last refill was       Relevant Medications   traMADol (ULTRAM) 50 MG tablet   celecoxib (CELEBREX) 100 MG capsule   cyclobenzaprine (FLEXERIL) 10 MG tablet   clonazePAM (KLONOPIN) 1 MG tablet   Chronic right-sided low back pain without sciatica    celebrex and tylenol prescribed. Continue prn use of Tramadol for moderate pain , maximum 2 daily.  Refill history confirmed via [place] Controlled Substance databas, accessed by me today..      Relevant Medications   traMADol (ULTRAM) 50 MG tablet   celecoxib (CELEBREX) 100 MG capsule   cyclobenzaprine (FLEXERIL) 10 MG tablet   Colon cancer screening    Referral to [facility] for screening colomoscopy advised and made       Relevant Orders   Ambulatory referral to Gastroenterology   Essential hypertension    Well controlled on current regimen. Renal function stable, no changes today.  Lab Results  Component Value Date   CREATININE 0.91 08/28/2018   Lab Results  Component Value Date   NA 139 08/28/2018   K 4.1 08/28/2018   CL 104 08/28/2018   CO2 27 08/28/2018          Relevant Orders   Comprehensive metabolic panel (Completed)   Major depressive disorder, recurrent episode (HCC)    complicatd by GAD.  Improving with Paxil.  No changes today       Mixed hyperlipidemia    Continue statin and Zetia for management of elevated cardiac risk per recent CardiacCT: CALCIUM SCORE 14 ,  68TH PERCENTILE). LFTS are normal.   Lab Results  Component Value Date   CHOL 117 08/28/2018   HDL 36.70 (L) 08/28/2018   LDLCALC 61 08/28/2018   LDLDIRECT 146.0 07/28/2015   TRIG 92.0 08/28/2018   CHOLHDL 3 08/28/2018   Lab Results  Component Value Date   ALT 33 08/28/2018   AST 24 08/28/2018   ALKPHOS 46 08/28/2018   BILITOT 0.9 08/28/2018         Relevant Orders   Lipid panel (Completed)   New onset of headaches after age 9    Right frontal,  2 to 3 times per week acc'd by nausea and altered olfactory sense on right side.  Sinuses on right side normal per ENT evaluation.  MRi brain ordered.        Relevant Medications   traMADol (ULTRAM) 50 MG tablet   celecoxib (CELEBREX) 100 MG capsule   cyclobenzaprine (FLEXERIL) 10 MG tablet   clonazePAM (KLONOPIN) 1 MG tablet   Obesity (BMI 30.0-34.9)    I have addressed  BMI and recommended wt loss of 10% of body weigh over the next 6 months using a low glycemic index diet and regular exercise a minimum of 5 days per week.        Ulnar neuropathy at elbow of left upper extremity    Managed in the past with steroid injection at base of wrist.  Returning to Orthopedics for return of symptoms.  Relevant Medications   cyclobenzaprine (FLEXERIL) 10 MG tablet   clonazePAM (KLONOPIN) 1 MG tablet   Vitamin D deficiency   Relevant Orders   VITAMIN D 25 Hydroxy (Vit-D Deficiency, Fractures) (Completed)    Other Visit Diagnoses    Prostate cancer screening    -  Primary   Relevant Orders   PSA (Completed)   Screen for STD (sexually transmitted disease)       Relevant Orders   HIV Antibody (routine testing w  rflx) (Completed)   Alopecia       Relevant Orders   TSH (Completed)   Fatigue, unspecified type       Relevant Orders   B12 and Folate Panel (Completed)   Episodic paroxysmal hemicrania, not intractable       Relevant Medications   traMADol (ULTRAM) 50 MG tablet   celecoxib (CELEBREX) 100 MG capsule   cyclobenzaprine (FLEXERIL) 10 MG tablet   clonazePAM (KLONOPIN) 1 MG tablet   Other Relevant Orders   MR Brain Wo Contrast   Frontal headache       Relevant Medications   traMADol (ULTRAM) 50 MG tablet   celecoxib (CELEBREX) 100 MG capsule   cyclobenzaprine (FLEXERIL) 10 MG tablet   clonazePAM (KLONOPIN) 1 MG tablet   Other Relevant Orders   MR Brain Wo Contrast      I have discontinued Graylin Shiver. Slemmer's terbinafine. I have also changed his traMADol, cyclobenzaprine, and clonazePAM. Additionally, I am having him maintain his montelukast, sildenafil, nystatin ointment, ezetimibe, dexlansoprazole, losartan-hydrochlorothiazide, rosuvastatin, Vitamin D (Ergocalciferol), econazole nitrate, diphenhydrAMINE, loratadine-pseudoephedrine, PARoxetine, and celecoxib.  Meds ordered this encounter  Medications  . traMADol (ULTRAM) 50 MG tablet    Sig: Take 1 tablet (50 mg total) by mouth 2 (two) times daily.    Dispense:  60 tablet    Refill:  5  . celecoxib (CELEBREX) 100 MG capsule    Sig: Take 1 capsule (100 mg total) by mouth 2 (two) times daily.    Dispense:  60 capsule    Refill:  5  . cyclobenzaprine (FLEXERIL) 10 MG tablet    Sig: Take 1 tablet (10 mg total) by mouth 3 (three) times daily as needed for muscle spasms.    Dispense:  60 tablet    Refill:  5  . clonazePAM (KLONOPIN) 1 MG tablet    Sig: Take 0.5 tablets (0.5 mg total) by mouth 2 (two) times daily.    Dispense:  30 tablet    Refill:  5    Medications Discontinued During This Encounter  Medication Reason  . traMADol (ULTRAM) 50 MG tablet Reorder  . celecoxib (CELEBREX) 100 MG capsule   . celecoxib (CELEBREX) 100  MG capsule Reorder  . cyclobenzaprine (FLEXERIL) 10 MG tablet Reorder  . clonazePAM (KLONOPIN) 1 MG tablet Reorder  . PARoxetine (PAXIL) 10 MG tablet   . terbinafine (LAMISIL) 250 MG tablet     Follow-up: Return in about 6 months (around 02/26/2019).   Sherlene Shams, MD

## 2018-08-29 ENCOUNTER — Encounter: Payer: Self-pay | Admitting: Internal Medicine

## 2018-08-29 DIAGNOSIS — R51 Headache: Secondary | ICD-10-CM

## 2018-08-29 DIAGNOSIS — R519 Headache, unspecified: Secondary | ICD-10-CM | POA: Insufficient documentation

## 2018-08-29 DIAGNOSIS — Z1211 Encounter for screening for malignant neoplasm of colon: Secondary | ICD-10-CM | POA: Insufficient documentation

## 2018-08-29 DIAGNOSIS — E538 Deficiency of other specified B group vitamins: Secondary | ICD-10-CM | POA: Insufficient documentation

## 2018-08-29 LAB — HIV ANTIBODY (ROUTINE TESTING W REFLEX): HIV 1&2 Ab, 4th Generation: NONREACTIVE

## 2018-08-29 MED ORDER — ERGOCALCIFEROL 1.25 MG (50000 UT) PO CAPS: 50000.0000 [IU] | ORAL_CAPSULE | ORAL | 0 refills | Status: DC

## 2018-08-29 NOTE — Assessment & Plan Note (Signed)
Secondary to acid suppression.  Oral supplement advised

## 2018-08-29 NOTE — Assessment & Plan Note (Signed)
Managed in the past with steroid injection at base of wrist.  Returning to Orthopedics for return of symptoms.

## 2018-08-29 NOTE — Assessment & Plan Note (Signed)
Continue Paxil.  Advised NOT to increzse use of clonazepam to moe than 1 mg daily

## 2018-08-29 NOTE — Assessment & Plan Note (Signed)
I have addressed  BMI and recommended wt loss of 10% of body weigh over the next 6 months using a low glycemic index diet and regular exercise a minimum of 5 days per week.   

## 2018-08-29 NOTE — Assessment & Plan Note (Addendum)
Well controlled on current regimen. Renal function stable, no changes today.  Lab Results  Component Value Date   CREATININE 0.91 08/28/2018   Lab Results  Component Value Date   NA 139 08/28/2018   K 4.1 08/28/2018   CL 104 08/28/2018   CO2 27 08/28/2018

## 2018-08-29 NOTE — Assessment & Plan Note (Signed)
Referral to [facility] for screening colomoscopy advised and made

## 2018-08-29 NOTE — Assessment & Plan Note (Signed)
Right frontal,  2 to 3 times per week acc'd by nausea and altered olfactory sense on right side.  Sinuses on right side normal per ENT evaluation.  MRi brain ordered.

## 2018-08-29 NOTE — Assessment & Plan Note (Signed)
Replacing with Drisdol weekly for 3 months

## 2018-08-29 NOTE — Assessment & Plan Note (Signed)
Continue statin and Zetia for management of elevated cardiac risk per recent CardiacCT: CALCIUM SCORE 14 ,  68TH PERCENTILE). LFTS are normal.   Lab Results  Component Value Date   CHOL 117 08/28/2018   HDL 36.70 (L) 08/28/2018   LDLCALC 61 08/28/2018   LDLDIRECT 146.0 07/28/2015   TRIG 92.0 08/28/2018   CHOLHDL 3 08/28/2018   Lab Results  Component Value Date   ALT 33 08/28/2018   AST 24 08/28/2018   ALKPHOS 46 08/28/2018   BILITOT 0.9 08/28/2018

## 2018-08-29 NOTE — Assessment & Plan Note (Signed)
complicatd by GAD.  Improving with Paxil.  No changes today

## 2018-08-29 NOTE — Assessment & Plan Note (Signed)
celebrex and tylenol prescribed. Continue prn use of Tramadol for moderate pain , maximum 2 daily.  Refill history confirmed via [place] Controlled Substance databas, accessed by me today.Marland Kitchen

## 2018-08-30 ENCOUNTER — Telehealth: Payer: Self-pay

## 2018-08-30 DIAGNOSIS — G44049 Chronic paroxysmal hemicrania, not intractable: Secondary | ICD-10-CM

## 2018-08-30 NOTE — Telephone Encounter (Signed)
Copied from CRM [id number]. Topic: Referral - Status >> Aug 30, 2018  3:36 PM Marylen Ponto wrote: Reason for CRM: Pt stated he was told that he would be referred for a brain scan but his insurance requires him to go to [facility]. Pt stated he needs the exact name of the scan and he would like the request to include the scan to go down to his jaw line. Cb# [phone]

## 2018-08-30 NOTE — Telephone Encounter (Signed)
Melissa,  Now the patient tells me that the MRI must include his jaw and must be done at [facility] per his insurance.  Can you cancel the scheduled MRI ?  I will make the new order to include the mandible  If you don't know (either!) where [facility],  Let me know and I'll ask the patient to find it.

## 2018-09-18 ENCOUNTER — Ambulatory Visit: Payer: BLUE CROSS/BLUE SHIELD

## 2018-09-25 ENCOUNTER — Other Ambulatory Visit (INDEPENDENT_AMBULATORY_CARE_PROVIDER_SITE_OTHER): Payer: BLUE CROSS/BLUE SHIELD

## 2018-09-25 DIAGNOSIS — E538 Deficiency of other specified B group vitamins: Secondary | ICD-10-CM | POA: Diagnosis not present

## 2018-09-25 DIAGNOSIS — R7989 Other specified abnormal findings of blood chemistry: Secondary | ICD-10-CM | POA: Diagnosis not present

## 2018-09-25 LAB — T4, FREE: Free T4: 0.85 ng/dL (ref 0.60–1.60)

## 2018-09-25 LAB — TSH: TSH: 0.42 u[IU]/mL (ref 0.35–4.50)

## 2018-09-25 LAB — VITAMIN B12: Vitamin B-12: 715 pg/mL (ref 211–911)

## 2018-09-27 LAB — INTRINSIC FACTOR ANTIBODIES: Intrinsic Factor: NEGATIVE

## 2018-10-10 ENCOUNTER — Ambulatory Visit: Payer: Self-pay

## 2018-10-10 NOTE — Telephone Encounter (Signed)
Sunday pt woke up vomiting "acid." Pt stated he was "projectile" vomiting. Pt stated he had another episode on Monday. Pt stated he had tried a new recipe that contained tomatoes and spices that he is not use to using. Pt stated that he feels weak and had soreness to his upper abdomen and to both sides and to lower back. Pt stated that he has a h/o low back pain. Pt stated he is having acid reflux today as well. Pt sleeps with his bed elevated and is on prescription Dexilant. Pt denies chest pain, SOB, difficulty breathing, nausea or any further vomiting. Pt stated that his upper abdomen and sides are sore if he sneezes or coughs then will subside.  Care advice given and pt verbalized understanding.  Sending note to office as per disposition.    Reason for Disposition . [1] Patient claims chest pain is same as previously diagnosed "heartburn" AND [2] describes burning in chest AND [3] accompanying sour taste in mouth  Answer Assessment - Initial Assessment Questions 1. LOCATION: "Where does it hurt?"      Stomach is sore  2. RADIATION: "Does the pain shoot anywhere else?" (e.g., chest, back)    To back and sides  3. ONSET: "When did the pain begin?" (e.g., minutes, hours or days ago)      Monday 4. SUDDEN: "Gradual or sudden onset?"     suddenly 5. PATTERN "Does the pain come and go, or is it constant?"    - If constant: "Is it getting better, staying the same, or worsening?"      (Note: Constant means the pain never goes away completely; most serious pain is constant and it progresses)     - If intermittent: "How long does it last?" "Do you have pain now?"     (Note: Intermittent means the pain goes away completely between bouts)     constant 6. SEVERITY: "How bad is the pain?"  (e.g., Scale 1-10; mild, moderate, or severe)    - MILD (1-3): doesn't interfere with normal activities, abdomen soft and not tender to touch     - MODERATE (4-7): interferes with normal activities or awakens from  sleep, tender to touch     - SEVERE (8-10): excruciating pain, doubled over, unable to do any normal activities       mild 7. RECURRENT SYMPTOM: "Have you ever had this type of abdominal pain before?" If so, ask: "When was the last time?" and "What happened that time?"      no 8. AGGRAVATING FACTORS: "Does anything seem to cause this pain?" (e.g., foods, stress, alcohol)     vomiting 9. CARDIAC SYMPTOMS: "Do you have any of the following symptoms: chest pain, difficulty breathing, sweating, nausea?"     no 10. OTHER SYMPTOMS: "Do you have any other symptoms?" (e.g., fever, vomiting, diarrhea)       no 11. PREGNANCY: "Is there any chance you are pregnant?" "When was your last menstrual period?"       n/a  Answer Assessment - Initial Assessment Questions 1. LOCATION: "Where does it hurt?"      Stomach and sides and back to the abdominal section 2. RADIATION: "Does the pain go anywhere else?" (e.g., into neck, jaw, arms, back) From stomach to back lower      3. ONSET: "When did the chest pain begin?" (Minutes, hours or days)      Denies chest pain 4. PATTERN "Does the pain come and go, or has it been constant  since it started?"  "Does it get worse with exertion?"      Comes and goes 5. DURATION: "How long does it last" (e.g., seconds, minutes, hours)     If sneezes laughs or coughs 6. SEVERITY: "How bad is the pain?"  (e.g., Scale 1-10; mild, moderate, or severe)    - MILD (1-3): doesn't interfere with normal activities     - MODERATE (4-7): interferes with normal activities or awakens from sleep    - SEVERE (8-10): excruciating pain, unable to do any normal activities       Moderate with coughing and goes away after 5 minutes to go away 7. CARDIAC RISK FACTORS: "Do you have any history of heart problems or risk factors for heart disease?" (e.g., prior heart attack, angina; high blood pressure, diabetes, being overweight, high cholesterol, smoking, or strong family history of heart  disease)     HTN, cholesterol, overweight, brother died of heart attack 8. PULMONARY RISK FACTORS: "Do you have any history of lung disease?"  (e.g., blood clots in lung, asthma, emphysema, birth control pills)     no 9. CAUSE: "What do you think is causing the chest pain?"     Denies chest pain 10. OTHER SYMPTOMS: "Do you have any other symptoms?" (e.g., dizziness, nausea, vomiting, sweating, fever, difficulty breathing, cough)       H/o acid reflux, vomiting, nausea, projectile vomiting 11. PREGNANCY: "Is there any chance you are pregnant?" "When was your last menstrual period?"       n/a  Protocols used: CHEST PAIN-A-AH, ABDOMINAL PAIN - UPPER-A-AH

## 2018-10-11 NOTE — Telephone Encounter (Signed)
Called pt to see how he was feeling and to see if he needed to be seen. Pt stated that he was advised by the triage nurse that he could take mylanta and take it up to four times a day. Pt stated that he has done that and feels so much better. He stated that he woke up this morning with no burning in his chest and has been able to keep everything down. Pt stated that he does not feel that he needs an appt right now since he is feeling so much better.

## 2018-10-20 ENCOUNTER — Other Ambulatory Visit: Payer: Self-pay | Admitting: Gastroenterology

## 2018-10-20 DIAGNOSIS — R1011 Right upper quadrant pain: Secondary | ICD-10-CM

## 2018-10-21 ENCOUNTER — Other Ambulatory Visit: Payer: Self-pay | Admitting: Internal Medicine

## 2018-10-21 ENCOUNTER — Telehealth: Payer: Self-pay | Admitting: Physician Assistant

## 2018-10-21 NOTE — Telephone Encounter (Signed)
2979 10/21/18 Telephone call:  Patient called on-call service.  Explained that he was needing the liquid Carafate, not the pills as he "could not swallow at all".  Patient was somewhat frustrated with Dr. Loreta Ave as this was not done before the patient had to leave for work.  He is a Naval architect.  Explained to the patient I would be happy to call him in liquid Carafate.  He gave me the [facility] number [phone].  I did call them at around 0920 and prescribed Carafate liquid 5 mL's p.o. 4 times daily, 20 minutes before meals and at bedtime, for 30 days with 1 refill  Also discussed with the patient that he can dissolve his pills in a small amount of liquid and take them as a slurry.  Patient tells me he does not have the pills with him on the road and is appreciative of me calling in a prescription.  0814 Called the patient back and let him know prescription was at pharmacy.  Hyacinth Meeker, PA-C [facility]

## 2018-10-23 ENCOUNTER — Telehealth: Payer: Self-pay | Admitting: Internal Medicine

## 2018-10-23 MED ORDER — CLONAZEPAM 1 MG PO TABS: 1.0000 mg | ORAL_TABLET | Freq: Two times a day (BID) | ORAL | 5 refills | Status: DC | PRN

## 2018-10-23 NOTE — Telephone Encounter (Signed)
Spoke with pt and informed him that the rx has been changed and sent in to [facility]. Pt stated that he is scheduled for a nuclear test of his gallbladder tomorrow morning.

## 2018-10-23 NOTE — Telephone Encounter (Signed)
Yes,  I have revised the rx and sent in new rx for #45 tablets per month

## 2018-10-23 NOTE — Telephone Encounter (Signed)
Copied from CRM [id number]. Topic: Quick Communication - See Telephone Encounter >> Oct 23, 2018 12:09 PM Terisa Starr wrote: CRM for notification. See Telephone encounter for: 10/23/18.  Patient states he is not doing well with the 1mg  of clonazePAM (KLONOPIN) 1 MG tablet. He would like to know on the bad days that he is dealing with the anxiety could he take 2 pills. He does not want to take 2 pills everyday just on the very days that he has. He is thinking maybe 10-15 pills extra per script. He said if he needs to come in , he will be glad to. He said his psychiatrist agrees with this. Please Advise.

## 2018-10-24 ENCOUNTER — Ambulatory Visit (HOSPITAL_COMMUNITY)
Admission: RE | Admit: 2018-10-24 | Discharge: 2018-10-24 | Disposition: A | Discharge status: Home / Self Care | Payer: BLUE CROSS/BLUE SHIELD | Source: Ambulatory Visit | Attending: Gastroenterology | Admitting: Gastroenterology

## 2018-10-24 DIAGNOSIS — R1011 Right upper quadrant pain: Secondary | ICD-10-CM | POA: Insufficient documentation

## 2018-10-24 MED ORDER — TECHNETIUM TC 99M MEBROFENIN IV KIT
5.2000 | PACK | Freq: Once | INTRAVENOUS | Status: AC | PRN
  Administered 2018-10-24: 5.2 via INTRAVENOUS

## 2018-10-30 ENCOUNTER — Encounter: Payer: Self-pay | Admitting: Internal Medicine

## 2018-11-20 ENCOUNTER — Other Ambulatory Visit: Payer: Self-pay | Admitting: Internal Medicine

## 2018-12-02 ENCOUNTER — Other Ambulatory Visit: Payer: Self-pay | Admitting: Internal Medicine

## 2018-12-02 ENCOUNTER — Other Ambulatory Visit: Payer: Self-pay | Admitting: Cardiovascular Disease

## 2018-12-04 ENCOUNTER — Telehealth: Payer: Self-pay

## 2018-12-04 NOTE — Telephone Encounter (Signed)
Copied from CRM [id number]. Topic: General - Inquiry >> Dec 01, 2018  4:11 PM Lynne Logan D wrote: Reason for CRM: Pt called to schedule med refill appointment with Dr. Darrick Huntsman. Office closed. Please advise. CB#[phone]

## 2018-12-04 NOTE — Telephone Encounter (Signed)
Returned patient's call.  Scheduled for a medication 94-month follow up appt on 03/05/19 @ 4:00 pm.

## 2019-01-26 ENCOUNTER — Telehealth: Payer: Self-pay | Admitting: Internal Medicine

## 2019-01-26 NOTE — Telephone Encounter (Signed)
Pt dropped off [place] form. He also needs a note along with this form about the medications he is taking. Form is upfront in color folder.

## 2019-01-26 NOTE — Telephone Encounter (Signed)
Placed in red folder  

## 2019-01-29 DIAGNOSIS — Z0279 Encounter for issue of other medical certificate: Secondary | ICD-10-CM

## 2019-01-29 NOTE — Telephone Encounter (Signed)
Signed and returned to you 

## 2019-01-30 NOTE — Telephone Encounter (Signed)
faxed

## 2019-02-21 ENCOUNTER — Telehealth: Payer: Self-pay | Admitting: Internal Medicine

## 2019-02-21 NOTE — Telephone Encounter (Signed)
General/Other - Letter for work  The patient recently had a DOT and needs a letter from Dr. 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 Please fax or re-fax the letter to the black box(employment health center).

## 2019-02-22 ENCOUNTER — Other Ambulatory Visit: Payer: Self-pay | Admitting: Internal Medicine

## 2019-02-22 ENCOUNTER — Other Ambulatory Visit: Payer: Self-pay

## 2019-02-22 ENCOUNTER — Ambulatory Visit (INDEPENDENT_AMBULATORY_CARE_PROVIDER_SITE_OTHER): Payer: BC Managed Care – PPO | Admitting: Internal Medicine

## 2019-02-22 VITALS — Wt 205.0 lb

## 2019-02-22 DIAGNOSIS — F411 Generalized anxiety disorder: Secondary | ICD-10-CM

## 2019-02-22 DIAGNOSIS — M130 Polyarthritis, unspecified: Secondary | ICD-10-CM

## 2019-02-22 DIAGNOSIS — G8929 Other chronic pain: Secondary | ICD-10-CM

## 2019-02-22 DIAGNOSIS — M545 Low back pain: Secondary | ICD-10-CM

## 2019-02-22 DIAGNOSIS — I700 Atherosclerosis of aorta: Secondary | ICD-10-CM

## 2019-02-22 MED ORDER — HYDROCODONE-ACETAMINOPHEN 5-325 MG PO TABS: 1.0000 | ORAL_TABLET | Freq: Four times a day (QID) | ORAL | 0 refills | Status: DC | PRN

## 2019-02-22 NOTE — Progress Notes (Signed)
Virtual Visit via [email or web address]  This visit type was conducted due to national recommendations for restrictions regarding the COVID-19 pandemic (e.g. social distancing).  This format is felt to be most appropriate for this patient at this time.  All issues noted in this document were discussed and addressed.  No physical exam was performed (except for noted visual exam findings with Video Visits).   I connected with@ on 02/22/19 at 10:30 AM EDT by a video enabled telemedicine application  and verified that I am speaking with the correct person using two identifiers. Location patient: home Location provider: work or home office Persons participating in the virtual visit: patient, provider  I discussed the limitations, risks, security and privacy concerns of performing an evaluation and management service by telephone and the availability of in person appointments. I also discussed with the patient that there may be a patient responsible charge related to this service. The patient expressed understanding and agreed to proceed.  Reason for visit: chronic joint pain   HPI:  53 yr old male with GAD managed with clonazepam and chronic musculoskeletal pain managed with judicious use of tylenol  Presents for evaluation and management of persistent and increased pain  Involving the right knee, , lumbar spine bilateral feet and ankles.   He had a steroid injection in the right knee  by [facility] in [place] yesterday and reports persistent unrelenting pain .  He is taking a twice daily NSAID and tramadol and has not been sleeping well at all.  He requests vicodin to use at bedtime only.   He is seeing  his chiropractor for management of back pain but requests a referral to the [facility] back doctor for comprehensive evaluation  He is losing  weight intentionally  ; his current weight is 205 lbs  But he is having trrouble exercising    He requires a letter stating that his use of tramadol and clonazepam do  not make him unsafe to drive.    He is concerned that his polyarthritis  may be secondary to RA bc he has a family history   ROS: See pertinent positives and negatives per HPI.  Past Medical History:  Diagnosis Date  . Anxiety state, unspecified   . Eosinophilic esophagitis 1517   by EGD with  biopsies  . GERD (gastroesophageal reflux disease)    carafate added by Dr. Collene Mares  . Hemorrhoids   . Irritable bowel syndrome   . Polyp of colon, hyperplastic 01/08/2011   Juanita Craver, repeat due 2018  . Treadmill stress test negative for angina pectoris 2003    Past Surgical History:  Procedure Laterality Date  . APPENDECTOMY     done during high school  . carpal tunnel release  Sept 2008   R hand, by  [place], [place] ARTHROSCOPY  2000   left  . NASAL SINUS SURGERY  1998   bilateral    Family History  Problem Relation Age of Onset  . Hyperlipidemia Sister   . Hyperlipidemia Brother   . Heart attack Brother 32  . Hyperlipidemia Mother   . Hypertension Mother   . Hyperlipidemia Father   . Hypertension Father   . BRCA 1/2 Maternal Aunt     SOCIAL HX:  reports that he quit smoking about 7 years ago. His smoking use included cigarettes. He has a 15.00 pack-year smoking history. He has never used smokeless tobacco. He reports current alcohol use. He reports  that he does not use drugs.   Current Outpatient Medications:  .  celecoxib (CELEBREX) 100 MG capsule, Take 1 capsule (100 mg total) by mouth 2 (two) times daily., Disp: 60 capsule, Rfl: 5 .  clonazePAM (KLONOPIN) 1 MG tablet, Take 1 tablet (1 mg total) by mouth 2 (two) times daily as needed for anxiety., Disp: 45 tablet, Rfl: 5 .  cyclobenzaprine (FLEXERIL) 10 MG tablet, Take 1 tablet (10 mg total) by mouth 3 (three) times daily as needed for muscle spasms., Disp: 60 tablet, Rfl: 5 .  DEXILANT 60 MG capsule, TAKE 1 CAPSULE DAILY, Disp: 90 capsule, Rfl: 1 .  diphenhydrAMINE (BENADRYL) 25 MG tablet,  Take 25 mg by mouth every 6 (six) hours as needed., Disp: , Rfl:  .  econazole nitrate 1 % cream, Apply topically daily., Disp: , Rfl:  .  ergocalciferol (DRISDOL) 1.25 MG (50000 UT) capsule, Take 1 capsule (50,000 Units total) by mouth once a week., Disp: 12 capsule, Rfl: 0 .  ezetimibe (ZETIA) 10 MG tablet, TAKE 1 TABLET DAILY, Disp: 30 tablet, Rfl: 0 .  loratadine-pseudoephedrine (CLARITIN-D 12-HOUR) 5-120 MG tablet, Take 1 tablet by mouth 2 (two) times daily., Disp: , Rfl:  .  losartan-hydrochlorothiazide (HYZAAR) 100-12.5 MG tablet, TAKE 1 TABLET DAILY, Disp: 90 tablet, Rfl: 1 .  montelukast (SINGULAIR) 10 MG tablet, Take 10 mg by mouth at bedtime., Disp: , Rfl:  .  nystatin ointment (MYCOSTATIN), Apply 1 application topically 2 (two) times daily., Disp: 30 g, Rfl: 1 .  PARoxetine (PAXIL) 20 MG tablet, Take 1 tablet (20 mg total) by mouth daily. After dinner, Disp: 90 tablet, Rfl: 2 .  rosuvastatin (CRESTOR) 5 MG tablet, TAKE 1 TABLET DAILY, Disp: 90 tablet, Rfl: 1 .  sildenafil (REVATIO) 20 MG tablet, TAKE 3 TO 5 TABLETS BY MOUTH DAILY AS NEEDED, Disp: 90 tablet, Rfl: 0 .  traMADol (ULTRAM) 50 MG tablet, Take 1 tablet (50 mg total) by mouth 2 (two) times daily., Disp: 60 tablet, Rfl: 5 .  HYDROcodone-acetaminophen (NORCO/VICODIN) 5-325 MG tablet, Take 1 tablet by mouth every 6 (six) hours as needed for moderate pain., Disp: 30 tablet, Rfl: 0 .  Vitamin D, Ergocalciferol, (DRISDOL) 1.25 MG (50000 UT) CAPS capsule, TAKE 1 CAPSULE BY MOUTH ONCE A WEEK, Disp: 12 capsule, Rfl: 0  EXAM:  VITALS per patient if applicable:  GENERAL: alert, oriented, appears well and in no acute distress  HEENT: atraumatic, conjunttiva clear, no obvious abnormalities on inspection of external nose and ears  NECK: normal movements of the head and neck  LUNGS: on inspection no signs of respiratory distress, breathing rate appears normal, no obvious gross SOB, gasping or wheezing  CV: no obvious cyanosis  MS:  moves all visible extremities without noticeable abnormality  PSYCH/NEURO: pleasant and cooperative, no obvious depression or anxiety, speech and thought processing grossly intact  ASSESSMENT AND PLAN:  Polyarthritis of multiple sites Repeat serologic workup for RA requested by patient for joint pain involving feet,  ankles  Lower back and knee    Anxiety state Managed with paxil and once daily clonazepam   Aortic atherosclerosis (HCC) Noted on imaging study. Continue use of statin and low cholesterol diet   Lab Results  Component Value Date   CHOL 117 08/28/2018   HDL 36.70 (L) 08/28/2018   LDLCALC 61 08/28/2018   LDLDIRECT 146.0 07/28/2015   TRIG 92.0 08/28/2018   CHOLHDL 3 08/28/2018     Chronic right-sided low back pain without sciatica He is using  scheduled celebrex and prn Tramadol for moderate pain , maximum 2 daily. 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    Refill history confirmed via [place] Controlled Substance databas, accessed by me today.Marland Kitchenadding once vicodin at nighttime . Referring to [facility] for evaluation    Updated Medication List Outpatient Encounter Medications as of 02/22/2019  Medication Sig  . celecoxib (CELEBREX) 100 MG capsule Take 1 capsule (100 mg total) by mouth 2 (two) times daily.  . clonazePAM (KLONOPIN) 1 MG tablet Take 1 tablet (1 mg total) by mouth 2 (two) times daily as needed for anxiety.  . cyclobenzaprine (FLEXERIL) 10 MG tablet Take 1 tablet (10 mg total) by mouth 3 (three) times daily as needed for muscle spasms.  . DEXILANT 60 MG capsule TAKE 1 CAPSULE DAILY  . diphenhydrAMINE (BENADRYL) 25 MG tablet Take 25 mg by mouth every 6 (six) hours as needed.  Marland Kitchen econazole nitrate 1 % cream Apply topically daily.  . ergocalciferol (DRISDOL) 1.25 MG (50000 UT) capsule Take 1 capsule (50,000 Units total) by mouth once a week.  . ezetimibe (ZETIA) 10 MG tablet TAKE 1 TABLET  DAILY  . loratadine-pseudoephedrine (CLARITIN-D 12-HOUR) 5-120 MG tablet Take 1 tablet by mouth 2 (two) times daily.  Marland Kitchen losartan-hydrochlorothiazide (HYZAAR) 100-12.5 MG tablet TAKE 1 TABLET DAILY  . montelukast (SINGULAIR) 10 MG tablet Take 10 mg by mouth at bedtime.  Marland Kitchen nystatin ointment (MYCOSTATIN) Apply 1 application topically 2 (two) times daily.  Marland Kitchen PARoxetine (PAXIL) 20 MG tablet Take 1 tablet (20 mg total) by mouth daily. After dinner  . rosuvastatin (CRESTOR) 5 MG tablet TAKE 1 TABLET DAILY  . sildenafil (REVATIO) 20 MG tablet TAKE 3 TO 5 TABLETS BY MOUTH DAILY AS NEEDED  . traMADol (ULTRAM) 50 MG tablet Take 1 tablet (50 mg total) by mouth 2 (two) times daily.  . [DISCONTINUED] Vitamin D, Ergocalciferol, (DRISDOL) 1.25 MG (50000 UT) CAPS capsule TAKE 1 CAPSULE (50,000 UNITS TOTAL) BY MOUTH ONCE A WEEK.  Marland Kitchen HYDROcodone-acetaminophen (NORCO/VICODIN) 5-325 MG tablet Take 1 tablet by mouth every 6 (six) hours as needed for moderate pain.   No facility-administered encounter medications on file as of 02/22/2019.       I discussed the assessment and treatment plan with the patient. The patient was provided an opportunity to ask questions and all were answered. The patient agreed with the plan and demonstrated an understanding of the instructions.   The patient was advised to call back or seek an in-person evaluation if the symptoms worsen or if the condition fails to improve as anticipated.  I provided 40 minutes of non-face-to-face time during this encounter.   Crecencio Mc, MD

## 2019-02-22 NOTE — Progress Notes (Signed)
Pt also stated that he has been having right knee swelling, and pain. Pt went to see [facility] yesterday and received a steroid injection. He was told that he has arthritis in his knee and it may take a few days for the swelling to go down. Pt stated that he has a family history of RA.

## 2019-02-23 ENCOUNTER — Other Ambulatory Visit (INDEPENDENT_AMBULATORY_CARE_PROVIDER_SITE_OTHER): Payer: BC Managed Care – PPO

## 2019-02-23 ENCOUNTER — Other Ambulatory Visit: Payer: Self-pay

## 2019-02-23 DIAGNOSIS — M130 Polyarthritis, unspecified: Secondary | ICD-10-CM

## 2019-02-23 LAB — CBC WITH DIFFERENTIAL/PLATELET
Basophils Absolute: 0 10*3/uL (ref 0.0–0.1)
Basophils Relative: 0.5 % (ref 0.0–3.0)
Eosinophils Absolute: 0.3 10*3/uL (ref 0.0–0.7)
Eosinophils Relative: 3.9 % (ref 0.0–5.0)
HCT: 45 % (ref 39.0–52.0)
Hemoglobin: 15.1 g/dL (ref 13.0–17.0)
Lymphocytes Relative: 36.5 % (ref 12.0–46.0)
Lymphs Abs: 2.6 10*3/uL (ref 0.7–4.0)
MCHC: 33.5 g/dL (ref 30.0–36.0)
MCV: 91.5 fl (ref 78.0–100.0)
Monocytes Absolute: 0.5 10*3/uL (ref 0.1–1.0)
Monocytes Relative: 6.6 % (ref 3.0–12.0)
Neutro Abs: 3.7 10*3/uL (ref 1.4–7.7)
Neutrophils Relative %: 52.5 % (ref 43.0–77.0)
Platelets: 301 10*3/uL (ref 150.0–400.0)
RBC: 4.91 Mil/uL (ref 4.22–5.81)
RDW: 13.8 % (ref 11.5–15.5)
WBC: 7 10*3/uL (ref 4.0–10.5)

## 2019-02-23 LAB — COMPREHENSIVE METABOLIC PANEL
ALT: 33 U/L (ref 0–53)
AST: 19 U/L (ref 0–37)
Albumin: 5.2 g/dL (ref 3.5–5.2)
Alkaline Phosphatase: 56 U/L (ref 39–117)
BUN: 12 mg/dL (ref 6–23)
CO2: 28 mEq/L (ref 19–32)
Calcium: 10.1 mg/dL (ref 8.4–10.5)
Chloride: 104 mEq/L (ref 96–112)
Creatinine, Ser: 0.96 mg/dL (ref 0.40–1.50)
GFR: 82.13 mL/min (ref >60.00)
Glucose, Bld: 86 mg/dL (ref 70–99)
Potassium: 4 mEq/L (ref 3.5–5.1)
Sodium: 142 mEq/L (ref 135–145)
Total Bilirubin: 0.8 mg/dL (ref 0.2–1.2)
Total Protein: 7.5 g/dL (ref 6.0–8.3)

## 2019-02-23 LAB — URIC ACID: Uric Acid, Serum: 7.2 mg/dL (ref 4.0–7.8)

## 2019-02-23 LAB — SEDIMENTATION RATE: Sed Rate: 2 mm/hr (ref 0–20)

## 2019-02-24 ENCOUNTER — Encounter: Payer: Self-pay | Admitting: Internal Medicine

## 2019-02-24 NOTE — Assessment & Plan Note (Signed)
He is using scheduled celebrex and prn Tramadol for moderate pain , maximum 2 daily. 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    Refill history confirmed via [place] Controlled Substance databas, accessed by me today.Marland Kitchenadding once vicodin at nighttime . Referring to [place] for evaluation

## 2019-02-24 NOTE — Assessment & Plan Note (Signed)
Managed with paxil and once daily clonazepam

## 2019-02-24 NOTE — Assessment & Plan Note (Addendum)
Noted on imaging study. Continue use of statin and low cholesterol diet   Lab Results  Component Value Date   CHOL 117 08/28/2018   HDL 36.70 (L) 08/28/2018   LDLCALC 61 08/28/2018   LDLDIRECT 146.0 07/28/2015   TRIG 92.0 08/28/2018   CHOLHDL 3 08/28/2018

## 2019-02-24 NOTE — Assessment & Plan Note (Addendum)
Repeat serologic workup for RA requested by patient for joint pain involving feet,  ankles  Lower back and knee

## 2019-02-27 LAB — ANA,IFA RA DIAG PNL W/RFLX TIT/PATN
Anti Nuclear Antibody (ANA): NEGATIVE
Cyclic Citrullin Peptide Ab: <16 UNITS
Rheumatoid fact SerPl-aCnc: <14 IU/mL (ref <14)

## 2019-02-28 ENCOUNTER — Telehealth: Payer: Self-pay

## 2019-02-28 NOTE — Telephone Encounter (Signed)
Appt for 7/20 cancelled.

## 2019-02-28 NOTE — Telephone Encounter (Signed)
Copied from [place] [id number]. Topic: Quick Communication - Appointment Cancellation >> Feb 28, 2019 11:47 AM Richardo Priest, NT wrote: Patient called to cancel appointment scheduled for 7/20. Patient has not rescheduled their appointment due to already doing an appointment 7/9.   Route to department's PEC pool.

## 2019-03-05 ENCOUNTER — Other Ambulatory Visit: Payer: Self-pay | Admitting: Internal Medicine

## 2019-03-05 ENCOUNTER — Ambulatory Visit: Payer: BLUE CROSS/BLUE SHIELD | Admitting: Internal Medicine

## 2019-03-05 NOTE — Telephone Encounter (Signed)
Refilled: 08/28/2018 Last OV: 02/22/2019 Next OV: not scheduled

## 2019-03-29 ENCOUNTER — Telehealth: Payer: Self-pay | Admitting: Internal Medicine

## 2019-03-29 NOTE — Telephone Encounter (Signed)
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 He says that he has a heat rash that is getting raw. This works well every summer. Last date seen 03/04/19 Pls send to  [place], [place] - [place] [facility] [phone] (Phone) [phone] (Fax)

## 2019-03-30 MED ORDER — ECONAZOLE NITRATE 1 % EX CREA: TOPICAL_CREAM | Freq: Two times a day (BID) | CUTANEOUS | 2 refills | Status: DC

## 2019-04-17 ENCOUNTER — Telehealth: Payer: Self-pay

## 2019-04-17 DIAGNOSIS — E559 Vitamin D deficiency, unspecified: Secondary | ICD-10-CM

## 2019-04-17 DIAGNOSIS — Z91018 Allergy to other foods: Secondary | ICD-10-CM

## 2019-04-17 DIAGNOSIS — K760 Fatty (change of) liver, not elsewhere classified: Secondary | ICD-10-CM

## 2019-04-17 NOTE — Telephone Encounter (Signed)
Ronald Cordova,  Patient is requesting allergy testing to coconut.  I have never ordered allergy testing before .  Can you review the two tests I ordered to see if they are available? If they ar enot I'll send him to an allergist

## 2019-04-17 NOTE — Telephone Encounter (Signed)
Copied from [place] [id number]. Topic: General - Other >> Apr 17, 2019  3:36 PM Erick Blinks wrote: Reason for CRM: Requesting lab work, pt wants full metabolic, vitamin B and D labs, and also allergy test for coconut. Please advise. Also states that mondays are preferable, those are his days off [phone]

## 2019-04-18 NOTE — Telephone Encounter (Signed)
Yes, the test codes ordered are find and can be collected here in office

## 2019-04-18 NOTE — Telephone Encounter (Signed)
LABS ORDERED.  HE WILL NEED TO SCHEDULE AN OFFICE VISIT TO DISCUSS THEM SINCE THESE WERE UNSOLICITED

## 2019-04-26 NOTE — Telephone Encounter (Signed)
Noted on lab schedule. Thanks

## 2019-04-26 NOTE — Telephone Encounter (Signed)
Spoke w/ patient.  Patient wants to hold on getting food allergy labs for coconut. Patient said that he cannot afford another visit w/ PCP to discuss labs.  Patient would only like to get his normal labs completed.  Patient scheduled for lab appt and flu shot on 04/30/19 @ 11:15 am.

## 2019-04-27 ENCOUNTER — Other Ambulatory Visit: Payer: Self-pay | Admitting: Internal Medicine

## 2019-04-27 DIAGNOSIS — R7989 Other specified abnormal findings of blood chemistry: Secondary | ICD-10-CM

## 2019-04-27 DIAGNOSIS — E538 Deficiency of other specified B group vitamins: Secondary | ICD-10-CM

## 2019-04-27 DIAGNOSIS — K760 Fatty (change of) liver, not elsewhere classified: Secondary | ICD-10-CM

## 2019-04-30 ENCOUNTER — Other Ambulatory Visit: Payer: Self-pay

## 2019-04-30 ENCOUNTER — Other Ambulatory Visit (INDEPENDENT_AMBULATORY_CARE_PROVIDER_SITE_OTHER): Payer: BC Managed Care – PPO

## 2019-04-30 DIAGNOSIS — Z23 Encounter for immunization: Secondary | ICD-10-CM | POA: Diagnosis not present

## 2019-04-30 DIAGNOSIS — E559 Vitamin D deficiency, unspecified: Secondary | ICD-10-CM | POA: Diagnosis not present

## 2019-04-30 DIAGNOSIS — Z91018 Allergy to other foods: Secondary | ICD-10-CM | POA: Diagnosis not present

## 2019-04-30 DIAGNOSIS — E538 Deficiency of other specified B group vitamins: Secondary | ICD-10-CM

## 2019-04-30 DIAGNOSIS — K760 Fatty (change of) liver, not elsewhere classified: Secondary | ICD-10-CM

## 2019-04-30 DIAGNOSIS — R7989 Other specified abnormal findings of blood chemistry: Secondary | ICD-10-CM

## 2019-04-30 LAB — CBC WITH DIFFERENTIAL/PLATELET
Basophils Absolute: 0 10*3/uL (ref 0.0–0.1)
Basophils Relative: 0.5 % (ref 0.0–3.0)
Eosinophils Absolute: 0.2 10*3/uL (ref 0.0–0.7)
Eosinophils Relative: 3.3 % (ref 0.0–5.0)
HCT: 45.2 % (ref 39.0–52.0)
Hemoglobin: 15.2 g/dL (ref 13.0–17.0)
Lymphocytes Relative: 37.8 % (ref 12.0–46.0)
Lymphs Abs: 2.4 10*3/uL (ref 0.7–4.0)
MCHC: 33.6 g/dL (ref 30.0–36.0)
MCV: 92.2 fl (ref 78.0–100.0)
Monocytes Absolute: 0.4 10*3/uL (ref 0.1–1.0)
Monocytes Relative: 6.8 % (ref 3.0–12.0)
Neutro Abs: 3.2 10*3/uL (ref 1.4–7.7)
Neutrophils Relative %: 51.6 % (ref 43.0–77.0)
Platelets: 278 10*3/uL (ref 150.0–400.0)
RBC: 4.9 Mil/uL (ref 4.22–5.81)
RDW: 13.4 % (ref 11.5–15.5)
WBC: 6.3 10*3/uL (ref 4.0–10.5)

## 2019-04-30 LAB — COMPREHENSIVE METABOLIC PANEL
ALT: 32 U/L (ref 0–53)
AST: 18 U/L (ref 0–37)
Albumin: 4.7 g/dL (ref 3.5–5.2)
Alkaline Phosphatase: 58 U/L (ref 39–117)
BUN: 18 mg/dL (ref 6–23)
CO2: 30 mEq/L (ref 19–32)
Calcium: 10.5 mg/dL (ref 8.4–10.5)
Chloride: 104 mEq/L (ref 96–112)
Creatinine, Ser: 0.88 mg/dL (ref 0.40–1.50)
GFR: 90.74 mL/min (ref >60.00)
Glucose, Bld: 89 mg/dL (ref 70–99)
Potassium: 4.2 mEq/L (ref 3.5–5.1)
Sodium: 142 mEq/L (ref 135–145)
Total Bilirubin: 0.7 mg/dL (ref 0.2–1.2)
Total Protein: 7.2 g/dL (ref 6.0–8.3)

## 2019-04-30 LAB — LIPID PANEL
Cholesterol: 117 mg/dL (ref 0–200)
HDL: 39.8 mg/dL (ref >39.00)
LDL Cholesterol: 62 mg/dL (ref 0–99)
NonHDL: 77.34
Total CHOL/HDL Ratio: 3
Triglycerides: 79 mg/dL (ref 0.0–149.0)
VLDL: 15.8 mg/dL (ref 0.0–40.0)

## 2019-04-30 LAB — VITAMIN D 25 HYDROXY (VIT D DEFICIENCY, FRACTURES): VITD: 64.73 ng/mL (ref 30.00–100.00)

## 2019-04-30 LAB — VITAMIN B12: Vitamin B-12: 911 pg/mL (ref 211–911)

## 2019-05-02 ENCOUNTER — Other Ambulatory Visit: Payer: Self-pay | Admitting: Internal Medicine

## 2019-05-02 ENCOUNTER — Other Ambulatory Visit: Payer: Self-pay | Admitting: Cardiovascular Disease

## 2019-05-05 LAB — TESTOS,TOTAL,FREE AND SHBG (FEMALE)
Free Testosterone: 76.7 pg/mL (ref 35.0–155.0)
Sex Hormone Binding: 13 nmol/L (ref 10–50)
Testosterone, Total, LC-MS-MS: 347 ng/dL (ref 250–1100)

## 2019-05-14 ENCOUNTER — Other Ambulatory Visit: Payer: Self-pay | Admitting: Internal Medicine

## 2019-05-14 NOTE — Telephone Encounter (Signed)
Refilled: 10/23/2018 Last OV: 02/22/2019 Next OV: not scheduled

## 2019-05-18 ENCOUNTER — Other Ambulatory Visit: Payer: Self-pay

## 2019-05-18 DIAGNOSIS — Z20822 Contact with and (suspected) exposure to covid-19: Secondary | ICD-10-CM

## 2019-05-19 LAB — NOVEL CORONAVIRUS, NAA: SARS-CoV-2, NAA: NOT DETECTED

## 2019-06-15 ENCOUNTER — Other Ambulatory Visit: Payer: Self-pay | Admitting: Internal Medicine

## 2019-06-15 MED ORDER — EZETIMIBE 10 MG PO TABS: 10.0000 mg | ORAL_TABLET | Freq: Every day | ORAL | 1 refills | Status: DC

## 2019-07-11 ENCOUNTER — Telehealth: Payer: Self-pay | Admitting: Internal Medicine

## 2019-07-11 MED ORDER — LEVOFLOXACIN 750 MG PO TABS: 750.0000 mg | ORAL_TABLET | Freq: Every day | ORAL | 0 refills | Status: DC

## 2019-07-11 NOTE — Telephone Encounter (Signed)
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 Patient experiencing slight burning, urgency to urinate hits him so hard. Patient states the same exact symptoms from before, please advise

## 2019-07-11 NOTE — Telephone Encounter (Signed)
levaquin sent to [facility] and patient notified by Juliann Pulse

## 2019-07-11 NOTE — Telephone Encounter (Signed)
PEC just sent this over but patient called at 11:35 today. Has frequency and burning with urination. Does not want to Go to UC due to COVID.

## 2019-08-13 ENCOUNTER — Other Ambulatory Visit: Payer: Self-pay | Admitting: Internal Medicine

## 2019-08-21 ENCOUNTER — Other Ambulatory Visit: Payer: Self-pay | Admitting: Internal Medicine

## 2019-08-23 ENCOUNTER — Other Ambulatory Visit: Payer: Self-pay

## 2019-08-31 ENCOUNTER — Telehealth: Payer: Self-pay | Admitting: Internal Medicine

## 2019-08-31 NOTE — Telephone Encounter (Signed)
According to labs done in January 2020 pt was prescribed vitamin d2 to take for several month and then switch to OTC vitamin d3.

## 2019-08-31 NOTE — Addendum Note (Signed)
Addended by: Sherlene Shams on: 08/31/2019 05:24 PM   Modules accepted: Orders

## 2019-08-31 NOTE — Telephone Encounter (Signed)
Ronald Cordova Duster with [facility] [phone] called they are needing clarification on Vitamin D, Ergocalciferol, (DRISDOL) 1.25 MG (50000 UT) CAPS capsule

## 2019-08-31 NOTE — Telephone Encounter (Signed)
Tell Ronald Cordova Duster with [facility] that he does not need a refill on the Vitamin D megadose any more,  Lat level was 60!

## 2019-09-17 ENCOUNTER — Other Ambulatory Visit: Payer: Self-pay

## 2019-09-17 ENCOUNTER — Ambulatory Visit (INDEPENDENT_AMBULATORY_CARE_PROVIDER_SITE_OTHER): Payer: BC Managed Care – PPO | Admitting: Internal Medicine

## 2019-09-17 ENCOUNTER — Encounter: Payer: Self-pay | Admitting: Internal Medicine

## 2019-09-17 VITALS — Ht 70.0 in | Wt 205.0 lb

## 2019-09-17 DIAGNOSIS — R350 Frequency of micturition: Secondary | ICD-10-CM

## 2019-09-17 DIAGNOSIS — E782 Mixed hyperlipidemia: Secondary | ICD-10-CM

## 2019-09-17 DIAGNOSIS — I700 Atherosclerosis of aorta: Secondary | ICD-10-CM

## 2019-09-17 DIAGNOSIS — Z125 Encounter for screening for malignant neoplasm of prostate: Secondary | ICD-10-CM | POA: Diagnosis not present

## 2019-09-17 DIAGNOSIS — I10 Essential (primary) hypertension: Secondary | ICD-10-CM

## 2019-09-17 DIAGNOSIS — E559 Vitamin D deficiency, unspecified: Secondary | ICD-10-CM

## 2019-09-17 DIAGNOSIS — F411 Generalized anxiety disorder: Secondary | ICD-10-CM

## 2019-09-17 DIAGNOSIS — Z113 Encounter for screening for infections with a predominantly sexual mode of transmission: Secondary | ICD-10-CM

## 2019-09-17 DIAGNOSIS — E669 Obesity, unspecified: Secondary | ICD-10-CM

## 2019-09-17 MED ORDER — HYDROCODONE-ACETAMINOPHEN 5-325 MG PO TABS: 1.0000 | ORAL_TABLET | Freq: Four times a day (QID) | ORAL | 0 refills | Status: DC | PRN

## 2019-09-17 NOTE — Progress Notes (Signed)
Virtual Visit via [email or web address]  This visit type was conducted due to national recommendations for restrictions regarding the COVID-19 pandemic (e.g. social distancing).  This format is felt to be most appropriate for this patient at this time.  All issues noted in this document were discussed and addressed.  No physical exam was performed (except for noted visual exam findings with Video Visits).   I connected with@ on 09/17/19 at  3:00 PM EST by a video enabled telemedicine application  and verified that I am speaking with the correct person using two identifiers. Location patient: home Location provider: work or home office Persons participating in the virtual visit: patient, provider  I discussed the limitations, risks, security and privacy concerns of performing an evaluation and management service by telephone and the availability of in person appointments. I also discussed with the patient that there may be a patient responsible charge related to this service. The patient expressed understanding and agreed to proceed.   Reason for visit:  1)  medication refill 2) STD testing requested 3) Vitamin D deficiency    HPI:  54 yr old male  With GAD,  Chronic back pain,  Joint pain,   Overweight,  Presents for 6 month follow up ,  Has lost 30 lbs on the 56 days" diet.  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  Scheduled to have MRI and follow up wit [facility] Dr Nelva Bush.  He has  only used 25 of 30 Vicodin prescribed in July,  MRI and Orthopedics referral in progress.   Overweight:  Has lost 30 lbs over the past year using a program called "56 days"  .  Feels great   Vitamin D deficiency:  On multiple occasions,  Was previously treated with megadoses ,  Last level 66 in September . Taking a supplement MVI but not sure of the amount   GAD:  Managed with clonazepam 1-2 daily.  Has not requested early refills,  Given 45/month  . Lifestyle and family judgemental  attitudes resulting in dysfunctional  dynamics play significant role in symptoms aggravated by  low self esteem    ROS: See pertinent positives and negatives per HPI.  Past Medical History:  Diagnosis Date  . Anxiety state, unspecified   . Eosinophilic esophagitis 3335   by EGD with  biopsies  . GERD (gastroesophageal reflux disease)    carafate added by Dr. Collene Mares  . Hemorrhoids   . Irritable bowel syndrome   . Polyp of colon, hyperplastic 01/08/2011   Juanita Craver, repeat due 2018  . Treadmill stress test negative for angina pectoris 2003    Past Surgical History:  Procedure Laterality Date  . APPENDECTOMY     done during high school  . carpal tunnel release  Sept 2008   R hand, by  [place], [place] ARTHROSCOPY  2000   left  . NASAL SINUS SURGERY  1998   bilateral    Family History  Problem Relation Age of Onset  . Hyperlipidemia Sister   . Hyperlipidemia Brother   . Heart attack Brother 23  . Hyperlipidemia Mother   . Hypertension Mother   . Hyperlipidemia Father   . Hypertension Father   . BRCA 1/2 Maternal Aunt     SOCIAL HX:  reports that he quit smoking about 8 years ago. His smoking use included cigarettes. He has a 15.00 pack-year smoking history. He has never used smokeless  tobacco. He reports current alcohol use. He reports that he does not use drugs.   Current Outpatient Medications:  .  azelastine (ASTELIN) 0.1 % nasal spray, , Disp: , Rfl:  .  celecoxib (CELEBREX) 100 MG capsule, Take 1 capsule (100 mg total) by mouth 2 (two) times daily., Disp: 60 capsule, Rfl: 5 .  clonazePAM (KLONOPIN) 1 MG tablet, TAKE 1 TABLET BY MOUTH TWICE A DAY AS NEEDED FOR ANXIETY, Disp: 45 tablet, Rfl: 3 .  cyclobenzaprine (FLEXERIL) 10 MG tablet, Take 1 tablet (10 mg total) by mouth 3 (three) times daily as needed for muscle spasms., Disp: 60 tablet, Rfl: 5 .  DEXILANT 60 MG capsule, TAKE 1 CAPSULE DAILY, Disp: 90 capsule, Rfl: 1 .   diphenhydrAMINE (BENADRYL) 25 MG tablet, Take 25 mg by mouth every 6 (six) hours as needed., Disp: , Rfl:  .  econazole nitrate 1 % cream, Apply topically 2 (two) times daily., Disp: 15 g, Rfl: 2 .  ezetimibe (ZETIA) 10 MG tablet, Take 1 tablet (10 mg total) by mouth daily., Disp: 90 tablet, Rfl: 1 .  fluticasone (FLONASE) 50 MCG/ACT nasal spray, , Disp: , Rfl:  .  HYDROcodone-acetaminophen (NORCO/VICODIN) 5-325 MG tablet, Take 1 tablet by mouth every 6 (six) hours as needed for moderate pain., Disp: 30 tablet, Rfl: 0 .  levofloxacin (LEVAQUIN) 750 MG tablet, Take 1 tablet (750 mg total) by mouth daily., Disp: 7 tablet, Rfl: 0 .  loratadine-pseudoephedrine (CLARITIN-D 12-HOUR) 5-120 MG tablet, Take 1 tablet by mouth 2 (two) times daily., Disp: , Rfl:  .  losartan-hydrochlorothiazide (HYZAAR) 100-12.5 MG tablet, TAKE 1 TABLET DAILY, Disp: 90 tablet, Rfl: 1 .  montelukast (SINGULAIR) 10 MG tablet, Take 10 mg by mouth at bedtime., Disp: , Rfl:  .  nystatin ointment (MYCOSTATIN), Apply 1 application topically 2 (two) times daily., Disp: 30 g, Rfl: 1 .  PARoxetine (PAXIL) 20 MG tablet, Take 1 tablet (20 mg total) by mouth daily. After dinner, Disp: 90 tablet, Rfl: 2 .  RESTORA RX 60-1.25 MG CAPS, Take 1 capsule by mouth daily., Disp: , Rfl:  .  RETIN-A 0.01 % gel, , Disp: , Rfl:  .  rosuvastatin (CRESTOR) 5 MG tablet, TAKE 1 TABLET DAILY, Disp: 90 tablet, Rfl: 1 .  sildenafil (REVATIO) 20 MG tablet, TAKE 3 TO 5 TABLETS BY MOUTH DAILY AS NEEDED, Disp: 90 tablet, Rfl: 0 .  traMADol (ULTRAM) 50 MG tablet, TAKE 1 TABLET BY MOUTH TWICE A DAY, Disp: 60 tablet, Rfl: 5  EXAM:  VITALS per patient if applicable:  GENERAL: alert, oriented, appears well and in no acute distress  HEENT: atraumatic, conjunttiva clear, no obvious abnormalities on inspection of external nose and ears  NECK: normal movements of the head and neck  LUNGS: on inspection no signs of respiratory distress, breathing rate appears  normal, no obvious gross SOB, gasping or wheezing  CV: no obvious cyanosis  MS: moves all visible extremities without noticeable abnormality  PSYCH/NEURO: pleasant and cooperative, no obvious depression or anxiety, speech and thought processing grossly intact  ASSESSMENT AND PLAN:  Discussed the following assessment and plan:  Screen for STD (sexually transmitted disease) - Plan: HIV Antibody (routine testing w rflx), Hepatitis C antibody, RPR, Urine cytology ancillary only([place]), VITAMIN D 25 Hydroxy (Vit-D Deficiency, Fractures)  Vitamin D deficiency  Prostate cancer screening - Plan: PSA  Urinary frequency - Plan: Urinalysis, Routine w reflex microscopic, Urine Culture  Anxiety state  Aortic atherosclerosis ([place])  Essential hypertension - Plan:  Comprehensive metabolic panel  Mixed hyperlipidemia - Plan: Lipid panel  Obesity (BMI 30.0-34.9)  Anxiety state Managed with paxil and once daily clonazepam , with occasional need for second dose limited to #45 /month.  Refill history confirmed via [place] Controlled Substance databas, accessed by me today.Marland Kitchen Refills  Given   Aortic atherosclerosis ([place]) Noted on imaging study. Continue use of statin and low cholesterol diet   Lab Results  Component Value Date   CHOL 117 04/30/2019   HDL 39.80 04/30/2019   LDLCALC 62 04/30/2019   LDLDIRECT 146.0 07/28/2015   TRIG 79.0 04/30/2019   CHOLHDL 3 04/30/2019     Obesity (BMI 30.0-34.9) I have congratulated him in weight loss of 30 lbs and reduction of   BMI and encouraged  Continued maintenance  Of normal  body weight  using a low glycemic index diet and regular exercise a minimum of 5 days per week.      I discussed the assessment and treatment plan with the patient. The patient was provided an opportunity to ask questions and all were answered. The patient agreed with the plan and demonstrated an understanding of the instructions.   The patient was advised to call back or  seek an in-person evaluation if the symptoms worsen or if the condition fails to improve as anticipated.  I provided  30 minutes of non-face-to-face time during this encounter reviewing patient's current problems and past procedures/imaging studies, providing counseling on the above mentioned problems , and coordination  of care .   Crecencio Mc, MD

## 2019-09-18 ENCOUNTER — Encounter: Payer: Self-pay | Admitting: Internal Medicine

## 2019-09-18 NOTE — Assessment & Plan Note (Signed)
Managed with paxil and once daily clonazepam , with occasional need for second dose limited to #45 /month.  Refill history confirmed via [place] Controlled Substance databas, accessed by me today.Marland Kitchen Refills  Given

## 2019-09-18 NOTE — Assessment & Plan Note (Signed)
I have congratulated him in weight loss of 30 lbs and reduction of   BMI and encouraged  Continued maintenance  Of normal  body weight  using a low glycemic index diet and regular exercise a minimum of 5 days per week.

## 2019-09-18 NOTE — Assessment & Plan Note (Signed)
Noted on imaging study. Continue use of statin and low cholesterol diet   Lab Results  Component Value Date   CHOL 117 04/30/2019   HDL 39.80 04/30/2019   LDLCALC 62 04/30/2019   LDLDIRECT 146.0 07/28/2015   TRIG 79.0 04/30/2019   CHOLHDL 3 04/30/2019

## 2019-09-24 ENCOUNTER — Other Ambulatory Visit (HOSPITAL_COMMUNITY)
Admission: RE | Admit: 2019-09-24 | Discharge: 2019-09-24 | Disposition: A | Discharge status: Home / Self Care | Payer: BC Managed Care – PPO | Source: Ambulatory Visit | Attending: Internal Medicine | Admitting: Internal Medicine

## 2019-09-24 ENCOUNTER — Other Ambulatory Visit (INDEPENDENT_AMBULATORY_CARE_PROVIDER_SITE_OTHER): Payer: BC Managed Care – PPO

## 2019-09-24 ENCOUNTER — Other Ambulatory Visit: Payer: Self-pay

## 2019-09-24 ENCOUNTER — Other Ambulatory Visit: Payer: Self-pay | Admitting: Internal Medicine

## 2019-09-24 DIAGNOSIS — Z113 Encounter for screening for infections with a predominantly sexual mode of transmission: Secondary | ICD-10-CM | POA: Insufficient documentation

## 2019-09-24 DIAGNOSIS — E782 Mixed hyperlipidemia: Secondary | ICD-10-CM | POA: Diagnosis not present

## 2019-09-24 DIAGNOSIS — R350 Frequency of micturition: Secondary | ICD-10-CM | POA: Diagnosis not present

## 2019-09-24 DIAGNOSIS — I10 Essential (primary) hypertension: Secondary | ICD-10-CM

## 2019-09-24 DIAGNOSIS — Z125 Encounter for screening for malignant neoplasm of prostate: Secondary | ICD-10-CM

## 2019-09-24 NOTE — Addendum Note (Signed)
Addended by: Warden Fillers on: 09/24/2019 04:17 PM   Modules accepted: Orders

## 2019-09-25 LAB — COMPREHENSIVE METABOLIC PANEL
AG Ratio: 1.7 (calc) (ref 1.0–2.5)
ALT: 32 U/L (ref 9–46)
AST: 23 U/L (ref 10–35)
Albumin: 4.5 g/dL (ref 3.6–5.1)
Alkaline phosphatase (APISO): 56 U/L (ref 35–144)
BUN: 18 mg/dL (ref 7–25)
CO2: 24 mmol/L (ref 20–32)
Calcium: 10.1 mg/dL (ref 8.6–10.3)
Chloride: 106 mmol/L (ref 98–110)
Creat: 0.84 mg/dL (ref 0.70–1.33)
Globulin: 2.6 g/dL (calc) (ref 1.9–3.7)
Glucose, Bld: 88 mg/dL (ref 65–99)
Potassium: 4.3 mmol/L (ref 3.5–5.3)
Sodium: 144 mmol/L (ref 135–146)
Total Bilirubin: 0.5 mg/dL (ref 0.2–1.2)
Total Protein: 7.1 g/dL (ref 6.1–8.1)

## 2019-09-25 LAB — RPR: RPR Ser Ql: NONREACTIVE

## 2019-09-25 LAB — HEPATITIS C ANTIBODY
Hepatitis C Ab: NONREACTIVE
SIGNAL TO CUT-OFF: 0.01 (ref <1.00)

## 2019-09-25 LAB — LIPID PANEL
Cholesterol: 124 mg/dL (ref <200)
HDL: 38 mg/dL — ABNORMAL LOW (ref >=40)
LDL Cholesterol (Calc): 65 mg/dL (calc)
Non-HDL Cholesterol (Calc): 86 mg/dL (calc) (ref <130)
Total CHOL/HDL Ratio: 3.3 (calc) (ref <5.0)
Triglycerides: 130 mg/dL (ref <150)

## 2019-09-25 LAB — URINALYSIS, ROUTINE W REFLEX MICROSCOPIC
Bilirubin Urine: NEGATIVE
Hgb urine dipstick: NEGATIVE
Ketones, ur: NEGATIVE
Leukocytes,Ua: NEGATIVE
Nitrite: NEGATIVE
RBC / HPF: NONE SEEN (ref 0–?)
Specific Gravity, Urine: >=1.03 — AB (ref 1.000–1.030)
Total Protein, Urine: NEGATIVE
Urine Glucose: NEGATIVE
Urobilinogen, UA: 0.2 (ref 0.0–1.0)
WBC, UA: NONE SEEN (ref 0–?)
pH: 5.5 (ref 5.0–8.0)

## 2019-09-25 LAB — URINE CULTURE
MICRO NUMBER:: 10127387
Result:: NO GROWTH
SPECIMEN QUALITY:: ADEQUATE

## 2019-09-25 LAB — VITAMIN D 25 HYDROXY (VIT D DEFICIENCY, FRACTURES): Vit D, 25-Hydroxy: 37 ng/mL (ref 30–100)

## 2019-09-25 LAB — HIV ANTIBODY (ROUTINE TESTING W REFLEX): HIV 1&2 Ab, 4th Generation: NONREACTIVE

## 2019-09-25 LAB — PSA: PSA: 0.5 ng/mL (ref <=4.0)

## 2019-09-26 LAB — URINE CYTOLOGY ANCILLARY ONLY
Chlamydia: NEGATIVE
Comment: NEGATIVE
Comment: NORMAL
Neisseria Gonorrhea: NEGATIVE

## 2019-10-17 ENCOUNTER — Other Ambulatory Visit: Payer: Self-pay | Admitting: Internal Medicine

## 2019-10-17 NOTE — Telephone Encounter (Signed)
Refill request for tramadol and muscle relaxers, last seen 09-17-19.  Please advise.

## 2019-11-06 ENCOUNTER — Other Ambulatory Visit: Payer: Self-pay | Admitting: Internal Medicine

## 2019-11-19 ENCOUNTER — Telehealth: Payer: Self-pay | Admitting: Internal Medicine

## 2019-11-19 ENCOUNTER — Other Ambulatory Visit (INDEPENDENT_AMBULATORY_CARE_PROVIDER_SITE_OTHER): Payer: BC Managed Care – PPO

## 2019-11-19 ENCOUNTER — Other Ambulatory Visit: Payer: Self-pay

## 2019-11-19 DIAGNOSIS — R300 Dysuria: Secondary | ICD-10-CM

## 2019-11-19 LAB — POCT URINALYSIS DIPSTICK
Bilirubin, UA: NEGATIVE
Blood, UA: NEGATIVE
Glucose, UA: POSITIVE — AB
Leukocytes, UA: NEGATIVE
Nitrite, UA: POSITIVE
Protein, UA: POSITIVE — AB
Spec Grav, UA: 1.015 (ref 1.010–1.025)
Urobilinogen, UA: 2 E.U./dL — AB
pH, UA: 5.5 (ref 5.0–8.0)

## 2019-11-19 LAB — URINALYSIS, MICROSCOPIC ONLY

## 2019-11-19 NOTE — Telephone Encounter (Signed)
Yes I will not prescribe antibiotics  without a urinalysis and culture,  Thank you for ordering

## 2019-11-19 NOTE — Telephone Encounter (Signed)
Scheduled UA

## 2019-11-19 NOTE — Telephone Encounter (Signed)
Pt has symtpoms of  UTI and wants to get a refill of levaquin but does not want the 750mg  pill because it is too big for him to swallow. Same mg but maybe two tabs instead of that particular size? Please advise

## 2019-11-19 NOTE — Telephone Encounter (Signed)
Patient car is broke down and patient has burning with urination and believes it came from holding urine for 3 hours , he would like antibiotic called in he was advised on Saturday by Access nurse to go to UC patient did not go, and is requesting Levaquin is willing to come in give urine but PCP has no available appointments. I have ordered the test if this is ok?

## 2019-11-20 ENCOUNTER — Other Ambulatory Visit: Payer: Self-pay | Admitting: Internal Medicine

## 2019-11-20 DIAGNOSIS — R81 Glycosuria: Secondary | ICD-10-CM

## 2019-11-20 DIAGNOSIS — R809 Proteinuria, unspecified: Secondary | ICD-10-CM

## 2019-11-20 LAB — URINE CULTURE
MICRO NUMBER:: 10327504
Result:: NO GROWTH
SPECIMEN QUALITY:: ADEQUATE

## 2019-12-03 ENCOUNTER — Other Ambulatory Visit: Payer: Self-pay

## 2019-12-03 ENCOUNTER — Other Ambulatory Visit (INDEPENDENT_AMBULATORY_CARE_PROVIDER_SITE_OTHER): Payer: BC Managed Care – PPO

## 2019-12-03 DIAGNOSIS — R81 Glycosuria: Secondary | ICD-10-CM

## 2019-12-03 DIAGNOSIS — R809 Proteinuria, unspecified: Secondary | ICD-10-CM | POA: Diagnosis not present

## 2019-12-04 ENCOUNTER — Other Ambulatory Visit: Payer: Self-pay

## 2019-12-04 LAB — URINALYSIS, ROUTINE W REFLEX MICROSCOPIC
Bilirubin Urine: NEGATIVE
Hgb urine dipstick: NEGATIVE
Ketones, ur: NEGATIVE
Leukocytes,Ua: NEGATIVE
Nitrite: NEGATIVE
RBC / HPF: NONE SEEN (ref 0–?)
Specific Gravity, Urine: 1.01 (ref 1.000–1.030)
Total Protein, Urine: NEGATIVE
Urine Glucose: NEGATIVE
Urobilinogen, UA: 0.2 (ref 0.0–1.0)
WBC, UA: NONE SEEN (ref 0–?)
pH: 6.5 (ref 5.0–8.0)

## 2019-12-04 LAB — HEMOGLOBIN A1C: Hgb A1c MFr Bld: 5.3 % (ref 4.6–6.5)

## 2019-12-04 LAB — BASIC METABOLIC PANEL
BUN: 15 mg/dL (ref 6–23)
CO2: 31 mEq/L (ref 19–32)
Calcium: 10 mg/dL (ref 8.4–10.5)
Chloride: 102 mEq/L (ref 96–112)
Creatinine, Ser: 0.89 mg/dL (ref 0.40–1.50)
GFR: 89.36 mL/min (ref >60.00)
Glucose, Bld: 102 mg/dL — ABNORMAL HIGH (ref 70–99)
Potassium: 4 mEq/L (ref 3.5–5.1)
Sodium: 140 mEq/L (ref 135–145)

## 2019-12-04 LAB — MICROALBUMIN / CREATININE URINE RATIO
Creatinine,U: 47.8 mg/dL
Microalb Creat Ratio: 1.5 mg/g (ref 0.0–30.0)
Microalb, Ur: <0.7 mg/dL (ref 0.0–1.9)

## 2019-12-04 MED ORDER — CELECOXIB 100 MG PO CAPS: 100.0000 mg | ORAL_CAPSULE | Freq: Two times a day (BID) | ORAL | 1 refills | Status: DC

## 2019-12-04 MED ORDER — CYCLOBENZAPRINE HCL 10 MG PO TABS: ORAL_TABLET | ORAL | 1 refills | Status: DC

## 2019-12-13 ENCOUNTER — Encounter: Payer: Self-pay | Admitting: Emergency Medicine

## 2019-12-13 ENCOUNTER — Ambulatory Visit
Admission: EM | Admit: 2019-12-13 | Discharge: 2019-12-13 | Disposition: A | Discharge status: Home / Self Care | Payer: BC Managed Care – PPO | Attending: Emergency Medicine | Admitting: Emergency Medicine

## 2019-12-13 ENCOUNTER — Other Ambulatory Visit: Payer: Self-pay

## 2019-12-13 DIAGNOSIS — J301 Allergic rhinitis due to pollen: Secondary | ICD-10-CM | POA: Diagnosis not present

## 2019-12-13 DIAGNOSIS — Z03818 Encounter for observation for suspected exposure to other biological agents ruled out: Secondary | ICD-10-CM | POA: Diagnosis not present

## 2019-12-13 DIAGNOSIS — R059 Cough, unspecified: Secondary | ICD-10-CM

## 2019-12-13 DIAGNOSIS — R05 Cough: Secondary | ICD-10-CM

## 2019-12-13 MED ORDER — PREDNISONE 10 MG (21) PO TBPK: ORAL_TABLET | Freq: Every day | ORAL | 0 refills | Status: DC

## 2019-12-13 NOTE — Discharge Instructions (Signed)
Take the prednisone as directed.  Take over-the-counter plain Mucinex 1200 mg by mouth every 12 hours.  Continue using your allergy nasal spray.    Your COVID test is pending.  You should self quarantine until the test result is back.    Go to the emergency department if you develop shortness of breath, severe diarrhea, high fever not relieved by Tylenol or ibuprofen, or other concerning symptoms.

## 2019-12-13 NOTE — ED Triage Notes (Signed)
Patient in today c/o sinus pressure/pain ad cough x 3 days. Patient denies fever. Patient has had both Moderna vaccines. Patient's last vaccine >1 month ago.

## 2019-12-13 NOTE — ED Provider Notes (Signed)
Ronald Cordova    CSN: [id number] Arrival date & time: 12/13/19  1642      History   Chief Complaint Chief Complaint  Patient presents with  . Sinus Problem  . Cough    HPI Ronald Cordova is a 54 y.o. male.   Patient presents with sinus pressure, congestion, nonproductive cough, and "tickle" in his throat x3 days.  He states his symptoms started after he was mowing his grass and other activities outdoors.  He denies fever, rash, shortness of breath, vomiting, diarrhea, or other symptoms.  He has attempted treatment at home with OTC allergy medication.  Patient has received both COVID vaccines: Moderna more than one month ago.    The history is provided by the patient.    Past Medical History:  Diagnosis Date  . Anxiety state, unspecified   . Eosinophilic esophagitis 1314   by EGD with  biopsies  . GERD (gastroesophageal reflux disease)    carafate added by Dr. Collene Mares  . Hemorrhoids   . Irritable bowel syndrome   . Polyp of colon, hyperplastic 01/08/2011   Ronald Cordova, repeat due 2018  . Treadmill stress test negative for angina pectoris 2003    Patient Active Problem List   Diagnosis Date Noted  . Colon cancer screening 08/29/2018  . New onset of headaches after age 75 08/29/2018  . B12 deficiency 08/29/2018  . Allergic rhinitis 04/25/2018  . Aortic atherosclerosis ([place]) 11/21/2017  . Acute pain of left shoulder 11/05/2017  . Chronic right-sided low back pain without sciatica 09/08/2017  . Biceps tendinitis 08/27/2017  . Subacromial bursitis 03/15/2017  . Fatty liver 03/15/2017  . Vitamin D deficiency 12/23/2016  . Restless legs 12/21/2016  . Ulnar neuropathy at elbow of left upper extremity 06/29/2016  . Family history of sudden death in brother 16-Dec-2015  . Polyarthritis of multiple sites 12-16-2015  . Chronic bilateral thoracic back pain 07/29/2015  . Palpitations 07/29/2015  . Essential hypertension 11/13/2014  . Major depressive disorder, recurrent  episode ([place]) 11/03/2014  . Obesity (BMI 30.0-34.9) 08/28/2012  . Incidental pulmonary nodule, > 54m and < 89m07/03/2012  . 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 Isolated or specific phobia 01/21/2012  . Chest pain with moderate risk for cardiac etiology 01/21/2012  . Mixed hyperlipidemia 05/09/2011  . Anxiety state   . Irritable bowel syndrome   . Hemorrhoids     Past Surgical History:  Procedure Laterality Date  . APPENDECTOMY     done during high school  . carpal tunnel release  Sept 2008   [place]   [place]  . NASAL SINUS SURGERY  1998   bilateral       Home Medications    Prior to Admission medications   Medication Sig Start Date End Date Taking? Authorizing Provider  azelastine (ASTELIN) 0.1 % nasal spray  08/13/19  Yes [provider]  celecoxib (CELEBREX) 100 MG capsule Take 1 capsule (100 mg total) by mouth 2 (two) times daily. 12/04/19  Yes TuCrecencio McMD  clonazePAM (KLONOPIN) 1 MG tablet TAKE 1 TABLET BY MOUTH TWICE A DAY AS NEEDED FOR ANXIETY 08/13/19  Yes TuCrecencio McMD  cyclobenzaprine (FLEXERIL) 10 MG tablet TAKE 1 TABLET BY MOUTH 3 TIMES DAILY AS NEEDED FOR MUSCLE SPASMS 12/04/19  Yes TuCrecencio McMD  DEXILANT 60 MG capsule TAKE 1 CAPSULE DAILY 09/25/19  Yes Crecencio Mc, MD  diphenhydrAMINE (BENADRYL) 25 MG tablet Take 25 mg by mouth every 6 (six) hours as needed.   Yes [provider]  econazole nitrate 1 % cream Apply topically 2 (two) times daily. 03/30/19  Yes Crecencio Mc, MD  ezetimibe (ZETIA) 10 MG tablet TAKE 1 TABLET DAILY 11/08/19  Yes Crecencio Mc, MD  fluticasone Asencion Islam) 50 MCG/ACT nasal spray  08/13/19  Yes [provider]  HYDROcodone-acetaminophen (NORCO/VICODIN) 5-325 MG tablet Take 1 tablet by mouth every 6 (six) hours as needed for moderate pain. 09/17/19  Yes Crecencio Mc, MD  losartan-hydrochlorothiazide [facility])  100-12.5 MG tablet TAKE 1 TABLET DAILY 09/25/19  Yes Crecencio Mc, MD  nystatin ointment (MYCOSTATIN) Apply 1 application topically 2 (two) times daily. 12/12/17  Yes Crecencio Mc, MD  RETIN-A 0.01 % gel  06/25/19  Yes [provider]  rosuvastatin (CRESTOR) 5 MG tablet TAKE 1 TABLET DAILY 09/25/19  Yes Crecencio Mc, MD  sildenafil (REVATIO) 20 MG tablet TAKE 3 TO 5 TABLETS BY MOUTH DAILY AS NEEDED 08/13/19  Yes Crecencio Mc, MD  traMADol (ULTRAM) 50 MG tablet TAKE 1 TABLET BY MOUTH TWICE A DAY 10/17/19  Yes Crecencio Mc, MD  levofloxacin (LEVAQUIN) 750 MG tablet Take 1 tablet (750 mg total) by mouth daily. 07/11/19   Crecencio Mc, MD  loratadine-pseudoephedrine (CLARITIN-D 12-HOUR) 5-120 MG tablet Take 1 tablet by mouth 2 (two) times daily.    [provider]  montelukast (SINGULAIR) 10 MG tablet Take 10 mg by mouth at bedtime.    [provider]  PARoxetine (PAXIL) 20 MG tablet Take 1 tablet (20 mg total) by mouth daily. After dinner 06/06/18   Crecencio Mc, MD  predniSONE (STERAPRED UNI-PAK 21 TAB) 10 MG (21) TBPK tablet Take by mouth daily. Take 6 tabs by mouth daily  for 1 day, then 5 tabs for 1 day, then 4 tabs for 1 day, then 3 tabs for 1 day, 2 tabs for 1 day, then 1 tab by mouth daily for 1 day 12/13/19   Sharion Balloon, NP  RESTORA RX 60-1.25 MG CAPS Take 1 capsule by mouth daily. 08/13/19   [provider]    Family History Family History  Problem Relation Age of Onset  . Hyperlipidemia Sister   . Hyperlipidemia Brother   . Heart attack Brother 83  . Hyperlipidemia Mother   . Hypertension Mother   . Hyperlipidemia Father   . Hypertension Father   . BRCA 1/2 Maternal Aunt     Social History Social History   Tobacco Use  . Smoking status: Former Smoker    Packs/day: 1.00    Years: 15.00    Pack years: 15.00    Types: Cigarettes    Quit date: 05/17/2011    Years since quitting: 8.5  . Smokeless tobacco: Never Used  Substance  Use Topics  . Alcohol use: Yes    Comment: occasional  . Drug use: No     Allergies   Penicillins   Review of Systems Review of Systems  Constitutional: Negative for chills and fever.  HENT: Positive for congestion, postnasal drip, rhinorrhea and sinus pressure. Negative for ear pain and sore throat.   Eyes: Negative for pain and visual disturbance.  Respiratory: Positive for cough. Negative for shortness of breath.   Cardiovascular: Negative for chest pain and palpitations.  Gastrointestinal: Negative for abdominal pain, diarrhea, nausea and vomiting.  Genitourinary: Negative for  dysuria and hematuria.  Musculoskeletal: Negative for arthralgias and back pain.  Skin: Negative for color change and rash.  Neurological: Negative for seizures and syncope.  All other systems reviewed and are negative.    Physical Exam Triage Vital Signs ED Triage Vitals  Enc Vitals Group     BP 12/13/19 1646 (!) 141/84     Pulse Rate 12/13/19 1646 81     Resp 12/13/19 1646 18     Temp 12/13/19 1646 98.8 F (37.1 C)     Temp Source 12/13/19 1646 Oral     SpO2 12/13/19 1646 97 %     Weight 12/13/19 1647 195 lb (88.5 kg)     Height 12/13/19 1647 '5\' 9"'  (1.753 m)     Head Circumference --      Peak Flow --      Pain Score 12/13/19 1647 0     Pain Loc --      Pain Edu? --      Excl. in [place]? --    No data found.  Updated Vital Signs BP (!) 141/84 (BP Location: Right Arm)   Pulse 81   Temp 98.8 F (37.1 C) (Oral)   Resp 18   Ht '5\' 9"'  (1.753 m)   Wt 195 lb (88.5 kg)   SpO2 97%   BMI 28.80 kg/m   Visual Acuity Right Eye Distance:   Left Eye Distance:   Bilateral Distance:    Right Eye Near:   Left Eye Near:    Bilateral Near:     Physical Exam Vitals and nursing note reviewed.  Constitutional:      General: He is not in acute distress.    Appearance: He is well-developed.  HENT:     Head: Normocephalic and atraumatic.     Right Ear: Tympanic membrane normal.     Left Ear:  Tympanic membrane normal.     Nose: Congestion present.     Mouth/Throat:     Mouth: Mucous membranes are moist.     Pharynx: Oropharynx is clear.  Eyes:     Conjunctiva/sclera: Conjunctivae normal.  Cardiovascular:     Rate and Rhythm: Normal rate and regular rhythm.     Heart sounds: No murmur.  Pulmonary:     Effort: Pulmonary effort is normal. No respiratory distress.     Breath sounds: Normal breath sounds. No wheezing or rhonchi.  Abdominal:     Palpations: Abdomen is soft.     Tenderness: There is no abdominal tenderness. There is no guarding or rebound.  Musculoskeletal:     Cervical back: Neck supple.  Skin:    General: Skin is warm and dry.     Findings: No rash.  Neurological:     General: No focal deficit present.     Mental Status: He is alert and oriented to person, place, and time.  Psychiatric:        Mood and Affect: Mood normal.        Behavior: Behavior normal.      UC Treatments / Results  Labs (all labs ordered are listed, but only abnormal results are displayed) Labs Reviewed  NOVEL CORONAVIRUS, NAA    EKG   Radiology No results found.  Procedures Procedures (including critical care time)  Medications Ordered in UC Medications - No data to display  Initial Impression / Assessment and Plan / UC Course  I have reviewed the triage vital signs and the nursing notes.  Pertinent labs & imaging results  that were available during my care of the patient were reviewed by me and considered in my medical decision making (see chart for details).   Allergic rhinitis.  Cough.  Treating with prednisone and OTC Mucinex.  Instructed patient to continue his nasal allergy medication.  PCR COVID test performed here.  Instructed patient to self quarantine until the test result is back.  Instructed patient to go to the emergency department if he develops high fever, shortness of breath, severe diarrhea, or other concerning symptoms.  Patient agrees with plan of  care.     Final Clinical Impressions(s) / UC Diagnoses   Final diagnoses:  Cough  Seasonal allergic rhinitis due to pollen     Discharge Instructions     Take the prednisone as directed.  Take over-the-counter plain Mucinex 1200 mg by mouth every 12 hours.  Continue using your allergy nasal spray.    Your COVID test is pending.  You should self quarantine until the test result is back.    Go to the emergency department if you develop shortness of breath, severe diarrhea, high fever not relieved by Tylenol or ibuprofen, or other concerning symptoms.           ED Prescriptions    Medication Sig Dispense Auth. Provider   predniSONE (STERAPRED UNI-PAK 21 TAB) 10 MG (21) TBPK tablet Take by mouth daily. Take 6 tabs by mouth daily  for 1 day, then 5 tabs for 1 day, then 4 tabs for 1 day, then 3 tabs for 1 day, 2 tabs for 1 day, then 1 tab by mouth daily for 1 day 21 tablet Sharion Balloon, NP     PDMP not reviewed this encounter.   Sharion Balloon, NP 12/13/19 1724

## 2019-12-15 LAB — SARS-COV-2, NAA 2 DAY TAT

## 2019-12-15 LAB — NOVEL CORONAVIRUS, NAA: SARS-CoV-2, NAA: NOT DETECTED

## 2019-12-17 ENCOUNTER — Encounter: Payer: Self-pay | Admitting: Internal Medicine

## 2019-12-17 ENCOUNTER — Other Ambulatory Visit: Payer: Self-pay

## 2019-12-17 ENCOUNTER — Ambulatory Visit: Payer: BC Managed Care – PPO | Admitting: Internal Medicine

## 2019-12-17 VITALS — BP 144/86 | HR 96 | Temp 98.3°F | Resp 15 | Ht 69.0 in | Wt 201.8 lb

## 2019-12-17 DIAGNOSIS — R609 Edema, unspecified: Secondary | ICD-10-CM | POA: Diagnosis not present

## 2019-12-17 DIAGNOSIS — I10 Essential (primary) hypertension: Secondary | ICD-10-CM

## 2019-12-17 DIAGNOSIS — N410 Acute prostatitis: Secondary | ICD-10-CM | POA: Diagnosis not present

## 2019-12-17 LAB — URINALYSIS, ROUTINE W REFLEX MICROSCOPIC
Bilirubin Urine: NEGATIVE
Hgb urine dipstick: NEGATIVE
Ketones, ur: NEGATIVE
Leukocytes,Ua: NEGATIVE
Nitrite: NEGATIVE
RBC / HPF: NONE SEEN (ref 0–?)
Specific Gravity, Urine: 1.015 (ref 1.000–1.030)
Total Protein, Urine: NEGATIVE
Urine Glucose: NEGATIVE
Urobilinogen, UA: 0.2 (ref 0.0–1.0)
pH: 6 (ref 5.0–8.0)

## 2019-12-17 MED ORDER — DOXAZOSIN MESYLATE 1 MG PO TABS: 1.0000 mg | ORAL_TABLET | Freq: Every day | ORAL | 0 refills | Status: DC

## 2019-12-17 MED ORDER — CIPROFLOXACIN HCL 500 MG PO TABS
250.0000 mg | ORAL_TABLET | Freq: Two times a day (BID) | ORAL | 0 refills | Status: DC
Start: 2019-12-17 — End: 2020-06-23

## 2019-12-17 NOTE — Assessment & Plan Note (Signed)
Chronic venous insufficiency suspected.  Trial of compression stockings

## 2019-12-17 NOTE — Patient Instructions (Addendum)
Ciprofloxaxin 500 mg twice daily for 2 weeks for UTI/prostate   Cardura (doxasozin) 1 mg daily for enlarged prostate and for elevated blood pressure .  Take in the evening  continue losartan in the am   Compression knee highs for swelling

## 2019-12-17 NOTE — Assessment & Plan Note (Signed)
Adding cardura to current regimen 1 mg daily

## 2019-12-17 NOTE — Assessment & Plan Note (Signed)
Presumed , based on persistent symptoms since mid April.  cipro 500 mg bid plus Cardura

## 2019-12-17 NOTE — Progress Notes (Signed)
Subjective:  Patient ID: Ronald Cordova, male    DOB: 1966-07-23  Age: 54 y.o. MRN: [id number]  CC: The primary encounter diagnosis was Prostatitis, acute. Diagnoses of Essential hypertension and Edema, unspecified type were also pertinent to this visit.  HPI Ronald Cordova presents for  Evaluation of multiple symptoms  1) fluid retention in both legs,  brought on by prolonged sitting.   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 Interval development of distal leg spider veins and upper calf  early varicosities.  No orthopnea   2) urinary frequency with some post void dribbling, hesitancy ,  And perineal pressure.  Symptoms present since April 19 and ua was normal .  History of prostatis.   3) Treated for allergic rhinitis /conjunctivitis with prednisone taper by Urgent Care on April 29. Using allegra and mucinex  4) overweight;  Has lost 25 lbs over the past year making lifestyle changes  Has given up gluten, sugar, dairy,  Has increased water intake and walking daily   5) Hypertension:  Still elevated despite losing 25 lbs.  Not using NSAIDs daily   6) Back pain:  Using NSAID  Prn   Outpatient Medications Prior to Visit  Medication Sig Dispense Refill  . azelastine (ASTELIN) 0.1 % nasal spray     . celecoxib (CELEBREX) 100 MG capsule Take 1 capsule (100 mg total) by mouth 2 (two) times daily. 180 capsule 1  . clonazePAM (KLONOPIN) 1 MG tablet TAKE 1 TABLET BY MOUTH TWICE A DAY AS NEEDED FOR ANXIETY 45 tablet 3  . cyclobenzaprine (FLEXERIL) 10 MG tablet TAKE 1 TABLET BY MOUTH 3 TIMES DAILY AS NEEDED FOR MUSCLE SPASMS 270 tablet 1  . DEXILANT 60 MG capsule TAKE 1 CAPSULE DAILY 90 capsule 1  . econazole nitrate 1 % cream Apply topically 2 (two) times daily. 15 g 2  . ezetimibe (ZETIA) 10 MG tablet TAKE 1 TABLET DAILY 90 tablet 2  . fluticasone (FLONASE) 50 MCG/ACT nasal spray     . losartan-hydrochlorothiazide (HYZAAR) 100-12.5 MG tablet TAKE 1 TABLET DAILY 90 tablet 1  . nystatin  ointment (MYCOSTATIN) Apply 1 application topically 2 (two) times daily. 30 g 1  . PARoxetine (PAXIL) 20 MG tablet Take 1 tablet (20 mg total) by mouth daily. After dinner 90 tablet 2  . predniSONE (STERAPRED UNI-PAK 21 TAB) 10 MG (21) TBPK tablet Take by mouth daily. Take 6 tabs by mouth daily  for 1 day, then 5 tabs for 1 day, then 4 tabs for 1 day, then 3 tabs for 1 day, 2 tabs for 1 day, then 1 tab by mouth daily for 1 day 21 tablet 0  . RETIN-A 0.01 % gel     . rosuvastatin (CRESTOR) 5 MG tablet TAKE 1 TABLET DAILY 90 tablet 1  . sildenafil (REVATIO) 20 MG tablet TAKE 3 TO 5 TABLETS BY MOUTH DAILY AS NEEDED 90 tablet 0  . traMADol (ULTRAM) 50 MG tablet TAKE 1 TABLET BY MOUTH TWICE A DAY 60 tablet 2  . HYDROcodone-acetaminophen (NORCO/VICODIN) 5-325 MG tablet Take 1 tablet by mouth every 6 (six) hours as needed for moderate pain. 30 tablet 0  . montelukast (SINGULAIR) 10 MG tablet Take 10 mg by mouth at bedtime.    . diphenhydrAMINE (BENADRYL) 25 MG tablet Take 25 mg by mouth every 6 (six) hours as needed.    Marland Kitchen levofloxacin (LEVAQUIN) 750 MG tablet Take 1 tablet (750 mg total) by mouth daily. (Patient not taking: Reported on  12/17/2019) 7 tablet 0  . loratadine-pseudoephedrine (CLARITIN-D 12-HOUR) 5-120 MG tablet Take 1 tablet by mouth 2 (two) times daily.    Mady Gemma RX 60-1.25 MG CAPS Take 1 capsule by mouth daily.     No facility-administered medications prior to visit.    Review of Systems;  Patient denies headache, fevers, malaise, unintentional weight loss, skin rash,  dysphagia,  hemoptysis , cough, dyspnea, wheezing, chest pain, palpitations, orthopnea, edema, abdominal pain, nausea, melena, diarrhea, constipation, flank pain, dysuria, hematuria, urinary  Frequency, nocturia, numbness, tingling, seizures,  Focal weakness, Loss of consciousness,  Tremor, insomnia, depression, anxiety, and suicidal ideation.      Objective:  BP (!) 144/86 (BP Location: Left Arm, Patient Position:  Sitting, Cuff Size: Normal)   Pulse 96   Temp 98.3 F (36.8 C) (Oral)   Resp 15   Ht 5\' 9"  (1.753 m)   Wt 201 lb 12.8 oz (91.5 kg)   SpO2 96%   BMI 29.80 kg/m   BP Readings from Last 3 Encounters:  12/17/19 (!) 144/86  12/13/19 (!) 141/84  08/28/18 128/88    Wt Readings from Last 3 Encounters:  12/17/19 201 lb 12.8 oz (91.5 kg)  12/13/19 195 lb (88.5 kg)  09/17/19 205 lb (93 kg)    General appearance: alert, cooperative and appears stated age Ears: normal TM's and external ear canals both ears Throat: lips, mucosa, and tongue normal; teeth and gums normal Neck: no adenopathy, no carotid bruit, supple, symmetrical, trachea midline and thyroid not enlarged, symmetric, no tenderness/mass/nodules Back: symmetric, no curvature. ROM normal. No CVA tenderness. Lungs: clear to auscultation bilaterally Heart: regular rate and rhythm, S1, S2 normal, no murmur, click, rub or gallop Abdomen: soft, non-tender; bowel sounds normal; no masses,  no organomegaly Pulses: 2+ and symmetric Ext:  Spider veins over both medial malleoli, early varicosities over both calves. Trace pedal edema  Skin: Skin color, texture, turgor normal. No rashes or lesions Lymph nodes: Cervical, supraclavicular, and axillary nodes normal.  Lab Results  Component Value Date   HGBA1C 5.3 12/03/2019    Lab Results  Component Value Date   CREATININE 0.89 12/03/2019   CREATININE 0.84 09/24/2019   CREATININE 0.88 04/30/2019    Lab Results  Component Value Date   WBC 6.3 04/30/2019   HGB 15.2 04/30/2019   HCT 45.2 04/30/2019   PLT 278.0 04/30/2019   GLUCOSE 102 (H) 12/03/2019   CHOL 124 09/24/2019   TRIG 130 09/24/2019   HDL 38 (L) 09/24/2019   LDLDIRECT 146.0 07/28/2015   LDLCALC 65 09/24/2019   ALT 32 09/24/2019   AST 23 09/24/2019   NA 140 12/03/2019   K 4.0 12/03/2019   CL 102 12/03/2019   CREATININE 0.89 12/03/2019   BUN 15 12/03/2019   CO2 31 12/03/2019   TSH 0.42 09/25/2018   PSA 0.5  09/24/2019   HGBA1C 5.3 12/03/2019   MICROALBUR <0.7 12/03/2019    No results found.  Assessment & Plan:   Problem List Items Addressed This Visit      Unprioritized   Edema    Chronic venous insufficiency suspected.  Trial of compression stockings       Essential hypertension    Adding cardura to current regimen 1 mg daily       Relevant Medications   doxazosin (CARDURA) 1 MG tablet   Prostatitis, acute - Primary    Presumed , based on persistent symptoms since mid April.  cipro 500 mg bid plus Cardura  Relevant Orders   Urinalysis, Routine w reflex microscopic (Completed)   Urine Culture      I have discontinued Yetta Barre. [place]son's diphenhydrAMINE, loratadine-pseudoephedrine, levofloxacin, Restora RX, and HYDROcodone-acetaminophen. I am also having him start on ciprofloxacin and doxazosin. Additionally, I am having him maintain his montelukast, nystatin ointment, PARoxetine, econazole nitrate, sildenafil, clonazePAM, azelastine, fluticasone, Retin-A, Dexilant, losartan-hydrochlorothiazide, rosuvastatin, traMADol, ezetimibe, celecoxib, cyclobenzaprine, and predniSONE.  Meds ordered this encounter  Medications  . ciprofloxacin (CIPRO) 500 MG tablet    Sig: Take 0.5 tablets (250 mg total) by mouth 2 (two) times daily.    Dispense:  28 tablet    Refill:  0  . doxazosin (CARDURA) 1 MG tablet    Sig: Take 1 tablet (1 mg total) by mouth daily.    Dispense:  90 tablet    Refill:  0    Medications Discontinued During This Encounter  Medication Reason  . diphenhydrAMINE (BENADRYL) 25 MG tablet Patient Preference  . loratadine-pseudoephedrine (CLARITIN-D 12-HOUR) 5-120 MG tablet Patient Preference  . RESTORA RX 60-1.25 MG CAPS Patient Preference  . levofloxacin (LEVAQUIN) 750 MG tablet   . HYDROcodone-acetaminophen (NORCO/VICODIN) 5-325 MG tablet    I provided  30 minutes of  face-to-face time during this encounter reviewing patient's current problems and past  surgeries, labs and imaging studies, providing counseling on the above mentioned problems , and coordination  of care .  Follow-up: Return in about 6 months (around 06/18/2020).   Crecencio Mc, MD

## 2019-12-18 LAB — URINE CULTURE
MICRO NUMBER:: 10431803
Result:: NO GROWTH
SPECIMEN QUALITY:: ADEQUATE

## 2020-01-03 ENCOUNTER — Other Ambulatory Visit: Payer: Self-pay

## 2020-01-03 MED ORDER — AZELASTINE HCL 0.1 % NA SOLN: 1.0000 | Freq: Two times a day (BID) | NASAL | 1 refills | Status: DC

## 2020-01-03 MED ORDER — CLONAZEPAM 1 MG PO TABS: ORAL_TABLET | ORAL | 3 refills | Status: DC

## 2020-01-03 MED ORDER — FLUTICASONE PROPIONATE 50 MCG/ACT NA SUSP: 1.0000 | Freq: Every day | NASAL | 1 refills | Status: DC

## 2020-01-03 NOTE — Telephone Encounter (Signed)
Refilled: 08/13/2019 Last OV: 12/17/2019 Next OV: 06/23/2020

## 2020-01-08 ENCOUNTER — Telehealth: Payer: Self-pay | Admitting: Internal Medicine

## 2020-01-08 NOTE — Telephone Encounter (Signed)
Ronald Cordova with [facility] called and needs a call back about clarification on qty and directions for clonazePAM (KLONOPIN) 1 MG tablet. It states max 60/month but in comments, it reads 45. Direct number is [phone] ref #[id number]

## 2020-01-08 NOTE — Telephone Encounter (Signed)
Max 45 per month was written as well.  bc 2 times daily is a prn dose.

## 2020-01-08 NOTE — Telephone Encounter (Signed)
I called [facility] & spoke with pharmacy tech Amil Amen to clarify directions of 45 tabs per month.

## 2020-01-08 NOTE — Telephone Encounter (Signed)
This was sent for 45 does it need to be 60?

## 2020-01-24 ENCOUNTER — Telehealth: Payer: Self-pay

## 2020-01-24 MED ORDER — TRAMADOL HCL 50 MG PO TABS: 50.0000 mg | ORAL_TABLET | Freq: Two times a day (BID) | ORAL | 5 refills | Status: DC

## 2020-01-24 NOTE — Addendum Note (Signed)
Addended by: Sherlene Shams on: 01/24/2020 04:08 PM   Modules accepted: Orders

## 2020-01-24 NOTE — Telephone Encounter (Signed)
Pt is aware that a 30 day supply has been sent to [facility].

## 2020-01-24 NOTE — Telephone Encounter (Signed)
Pt is not out of medication just had filled at [facility]. Pt is wanting to know if the Tramadol can be sent to [facility] for a 90 day supply because it will be much cheaper for him. He said if you don't feel comfortable doing the 90 day supply he can still get a 30 day supply through the mail order if you are okay with that.

## 2020-01-24 NOTE — Telephone Encounter (Signed)
Not comfortable refilling for 90 days .  30 days sent to mail order for refill July 7

## 2020-02-18 ENCOUNTER — Encounter: Payer: Self-pay | Admitting: Emergency Medicine

## 2020-02-18 ENCOUNTER — Ambulatory Visit
Admission: EM | Admit: 2020-02-18 | Discharge: 2020-02-18 | Disposition: A | Discharge status: Home / Self Care | Payer: BC Managed Care – PPO | Attending: Emergency Medicine | Admitting: Emergency Medicine

## 2020-02-18 ENCOUNTER — Other Ambulatory Visit: Payer: Self-pay

## 2020-02-18 DIAGNOSIS — J029 Acute pharyngitis, unspecified: Secondary | ICD-10-CM | POA: Diagnosis present

## 2020-02-18 DIAGNOSIS — J069 Acute upper respiratory infection, unspecified: Secondary | ICD-10-CM | POA: Diagnosis present

## 2020-02-18 DIAGNOSIS — L0201 Cutaneous abscess of face: Secondary | ICD-10-CM | POA: Insufficient documentation

## 2020-02-18 HISTORY — DX: Essential (primary) hypertension: I10

## 2020-02-18 LAB — POCT RAPID STREP A (OFFICE): Rapid Strep A Screen: NEGATIVE

## 2020-02-18 MED ORDER — PREDNISONE 10 MG (21) PO TBPK: ORAL_TABLET | Freq: Every day | ORAL | 0 refills | Status: DC

## 2020-02-18 NOTE — ED Provider Notes (Signed)
[place]   [id number] 02/18/20 Arrival Time: 6301  SW:FUXN THROAT  SUBJECTIVE: History from: patient.  TORRIN CRIHFIELD is a 54 y.o. male who presents with abrupt onset of RT ear pain x 2 weeks, and sore throat x  2 days.  Denies sick exposure to strep, flu or mono, or precipitating event.  Has tried OTC medications without relief.  Symptoms are made worse with swallowing, but tolerating liquids and own secretions without difficulty.  Reports previous symptoms in the past.  Denies fever, chills, fatigue, cough, SOB, wheezing, chest pain, nausea, rash, changes in bowel or bladder habits.    Also mentions bump on forehead x 2 days.  Wore helmet prior to symptoms.  Complains of mild swelling and pain.  Denies redness, purulent drainage.    ROS: As per HPI.  All other pertinent ROS negative.     Past Medical History:  Diagnosis Date  . Anxiety state, unspecified   . Eosinophilic esophagitis 2355   by EGD with  biopsies  . GERD (gastroesophageal reflux disease)    carafate added by Dr. Collene Mares  . Hemorrhoids   . Hypertension   . Irritable bowel syndrome   . Polyp of colon, hyperplastic 01/08/2011   Juanita Craver, repeat due 2018  . Treadmill stress test negative for angina pectoris 2003   Past Surgical History:  Procedure Laterality Date  . APPENDECTOMY     done during high school  . carpal tunnel release  Sept 2008   R hand, by  [place], [place] ARTHROSCOPY  2000   left  . NASAL SINUS SURGERY  1998   bilateral   Allergies  Allergen Reactions  . Penicillins Hives    Has patient had a PCN reaction causing immediate rash, facial/tongue/throat swelling, SOB or lightheadedness with hypotension: No Has patient had a PCN reaction causing severe rash involving mucus membranes or skin necrosis: No Has patient had a PCN reaction that required hospitalization: No Has patient had a PCN reaction occurring within the last 10 years: No If all of the  above answers are "NO", then may proceed with Cephalosporin use.    No current facility-administered medications on file prior to encounter.   Current Outpatient Medications on File Prior to Encounter  Medication Sig Dispense Refill  . azelastine (ASTELIN) 0.1 % nasal spray Place 1 spray into both nostrils 2 (two) times daily. 30 mL 1  . celecoxib (CELEBREX) 100 MG capsule Take 1 capsule (100 mg total) by mouth 2 (two) times daily. 180 capsule 1  . ciprofloxacin (CIPRO) 500 MG tablet Take 0.5 tablets (250 mg total) by mouth 2 (two) times daily. 28 tablet 0  . clonazePAM (KLONOPIN) 1 MG tablet TAKE 1 TABLET BY MOUTH TWICE A DAY AS NEEDED FOR ANXIETY.max 60/month 45 tablet 3  . cyclobenzaprine (FLEXERIL) 10 MG tablet TAKE 1 TABLET BY MOUTH 3 TIMES DAILY AS NEEDED FOR MUSCLE SPASMS 270 tablet 1  . DEXILANT 60 MG capsule TAKE 1 CAPSULE DAILY 90 capsule 1  . doxazosin (CARDURA) 1 MG tablet Take 1 tablet (1 mg total) by mouth daily. 90 tablet 0  . econazole nitrate 1 % cream Apply topically 2 (two) times daily. 15 g 2  . ezetimibe (ZETIA) 10 MG tablet TAKE 1 TABLET DAILY 90 tablet 2  . fluticasone (FLONASE) 50 MCG/ACT nasal spray Place 1 spray into both nostrils daily. 16 g 1  . losartan-hydrochlorothiazide (HYZAAR) 100-12.5 MG tablet TAKE 1  TABLET DAILY 90 tablet 1  . montelukast (SINGULAIR) 10 MG tablet Take 10 mg by mouth at bedtime.    Marland Kitchen nystatin ointment (MYCOSTATIN) Apply 1 application topically 2 (two) times daily. 30 g 1  . PARoxetine (PAXIL) 20 MG tablet Take 1 tablet (20 mg total) by mouth daily. After dinner 90 tablet 2  . RETIN-A 0.01 % gel     . rosuvastatin (CRESTOR) 5 MG tablet TAKE 1 TABLET DAILY 90 tablet 1  . sildenafil (REVATIO) 20 MG tablet TAKE 3 TO 5 TABLETS BY MOUTH DAILY AS NEEDED 90 tablet 0  . traMADol (ULTRAM) 50 MG tablet Take 1 tablet (50 mg total) by mouth 2 (two) times daily. 60 tablet 5   Social History   Socioeconomic History  . Marital status: Single     Spouse name: Not on file  . Number of children: Not on file  . Years of education: Not on file  . Highest education level: Not on file  Occupational History  . Not on file  Tobacco Use  . Smoking status: Former Smoker    Packs/day: 1.00    Years: 15.00    Pack years: 15.00    Types: Cigarettes    Quit date: 05/17/2011    Years since quitting: 8.7  . Smokeless tobacco: Never Used  Vaping Use  . Vaping Use: Never used  Substance and Sexual Activity  . Alcohol use: Yes    Comment: occasional  . Drug use: No  . Sexual activity: Yes    Partners: Male  Other Topics Concern  . Not on file  Social History Narrative  . Not on file   Social Determinants of Health   Financial Resource Strain:   . Difficulty of Paying Living Expenses:   Food Insecurity:   . Worried About Charity fundraiser in the Last Year:   . Arboriculturist in the Last Year:   Transportation Needs:   . Film/video editor (Medical):   Marland Kitchen Lack of Transportation (Non-Medical):   Physical Activity:   . Days of Exercise per Week:   . Minutes of Exercise per Session:   Stress:   . Feeling of Stress :   Social Connections:   . Frequency of Communication with Friends and Family:   . Frequency of Social Gatherings with Friends and Family:   . Attends Religious Services:   . Active Member of Clubs or Organizations:   . Attends Archivist Meetings:   Marland Kitchen Marital Status:   Intimate Partner Violence:   . Fear of Current or Ex-Partner:   . Emotionally Abused:   Marland Kitchen Physically Abused:   . Sexually Abused:    Family History  Problem Relation Age of Onset  . Hyperlipidemia Sister   . Hyperlipidemia Brother   . Heart attack Brother 25  . Hyperlipidemia Mother   . Hypertension Mother   . Hyperlipidemia Father   . Hypertension Father   . BRCA 1/2 Maternal Aunt     OBJECTIVE:  Vitals:   02/18/20 1539 02/18/20 1541  BP:  122/79  Pulse:  83  Resp:  17  Temp:  98.4 F (36.9 C)  TempSrc:  Oral    SpO2:  96%  Weight: 195 lb (88.5 kg)   Height: _0  (1.753 m)      General appearance: alert; well-appearing, nontoxic, speaking in full sentences and managing own secretions HEENT: NCAT; Ears: EACs clear, TMs pearly gray with visible cone of light, without erythema;  Eyes: PERRL, EOMI grossly; Nose: no obvious rhinorrhea; Throat: oropharynx clear, tonsils 1+ and erythematous without white tonsillar exudates, uvula midline Neck: supple with anterior cervical LAD Lungs: CTA bilaterally without adventitious breath sounds; cough absent Heart: regular rate and rhythm.   Skin: warm and dry; 1 cm papule to RT forehead, mildly TTP, no obvious drainage, bleeding or erythema Psychological: alert and cooperative; normal mood and affect  LABS: Results for orders placed or performed during the hospital encounter of 02/18/20 (from the past 24 hour(s))  POCT rapid strep A     Status: None   Collection Time: 02/18/20  3:46 PM  Result Value Ref Range   Rapid Strep A Screen Negative Negative     ASSESSMENT & PLAN:  1. Sore throat   2. Viral URI   3. Abscess of face     Meds ordered this encounter  Medications  . predniSONE (STERAPRED UNI-PAK 21 TAB) 10 MG (21) TBPK tablet    Sig: Take by mouth daily. Take 6 tabs by mouth daily  for 2 days, then 5 tabs for 2 days, then 4 tabs for 2 days, then 3 tabs for 2 days, 2 tabs for 2 days, then 1 tab by mouth daily for 2 days    Dispense:  42 tablet    Refill:  0    Order Specific Question:   Supervising Provider    Answer:   Raylene Everts [[id number]]   Strep test negative, will send out for culture and we will call you with results Get plenty of rest and push fluids Continue with OTC medications Drink warm or cool liquids, use throat lozenges, or popsicles to help alleviate symptoms Take OTC ibuprofen or tylenol as needed for pain Prednisone prescribed.  Take as directed and to completion Follow up with PCP if symptoms persists Return or go to  ER if patient has any new or worsening symptoms such as fever, chills, nausea, vomiting, worsening sore throat, cough, abdominal pain, chest pain, changes in bowel or bladder habits, etc...  Keep forehead clean Apply neosporin x 2 daily  Reviewed expectations re: course of current medical issues. Questions answered. Outlined signs and symptoms indicating need for more acute intervention. Patient verbalized understanding. After Visit Summary given.        Lestine Box, PA-C 02/18/20 1625

## 2020-02-18 NOTE — ED Triage Notes (Signed)
Sore throat since Saturday,. Pt also has a knot on his forehead, states he was racing Saturday and had a helmet on which may have rubbed the area.

## 2020-02-18 NOTE — Discharge Instructions (Addendum)
Strep test negative, will send out for culture and we will call you with results Get plenty of rest and push fluids Continue with OTC medications Drink warm or cool liquids, use throat lozenges, or popsicles to help alleviate symptoms Take OTC ibuprofen or tylenol as needed for pain Prednisone prescribed.  Take as directed and to completion Follow up with PCP if symptoms persists Return or go to ER if patient has any new or worsening symptoms such as fever, chills, nausea, vomiting, worsening sore throat, cough, abdominal pain, chest pain, changes in bowel or bladder habits, etc...  Keep forehead clean Apply neosporin x 2 daily

## 2020-02-21 LAB — CULTURE, GROUP A STREP (THRC)

## 2020-02-22 ENCOUNTER — Telehealth (HOSPITAL_COMMUNITY): Payer: Self-pay | Admitting: Emergency Medicine

## 2020-02-22 ENCOUNTER — Telehealth: Payer: Self-pay | Admitting: Emergency Medicine

## 2020-02-22 MED ORDER — CEPHALEXIN 500 MG PO CAPS: 500.0000 mg | ORAL_CAPSULE | Freq: Two times a day (BID) | ORAL | 0 refills | Status: DC

## 2020-02-22 MED ORDER — DOXYCYCLINE HYCLATE 100 MG PO CAPS
100.0000 mg | ORAL_CAPSULE | Freq: Two times a day (BID) | ORAL | 0 refills | Status: DC
Start: 2020-02-22 — End: 2020-06-23

## 2020-02-22 NOTE — Telephone Encounter (Signed)
Patient called back requesting different antibiotic due to penicillin allergy.  Would like to avoid azithromycin, clarithromycin, or clindamycin.  We will try doxycycline.  [facility] [place]

## 2020-02-22 NOTE — Telephone Encounter (Signed)
Discussed culture results with patient after verifying identity using two identifiers.  Sent in Keflex 500 BID for 10 days per Dr. Delton See.  Updated pharmacy on file.  All questions answered, patient verbalized understanding

## 2020-02-26 ENCOUNTER — Other Ambulatory Visit: Payer: Self-pay | Admitting: Internal Medicine

## 2020-03-06 ENCOUNTER — Ambulatory Visit
Admission: EM | Admit: 2020-03-06 | Discharge: 2020-03-06 | Disposition: A | Discharge status: Home / Self Care | Payer: BC Managed Care – PPO | Attending: Emergency Medicine | Admitting: Emergency Medicine

## 2020-03-06 DIAGNOSIS — B3742 Candidal balanitis: Secondary | ICD-10-CM

## 2020-03-06 MED ORDER — NYSTATIN 100000 UNIT/GM EX CREA
TOPICAL_CREAM | CUTANEOUS | 0 refills | Status: DC
Start: 2020-03-06 — End: 2021-12-22

## 2020-03-06 NOTE — ED Triage Notes (Signed)
Pt reports having a rash on the shaft of penis. Reports being red and itchy x1 week. No other symptoms.

## 2020-03-06 NOTE — ED Provider Notes (Signed)
[facility]    CSN: [id number] Arrival date & time: 03/06/20  1510      History   Chief Complaint Chief Complaint  Patient presents with   Rash    HPI Ronald Cordova is a 54 y.o. male.   Patient presents with redness and irritation at the head of his penis x1 week.  He reports the area is itchy.  He denies fever, chills, abdominal pain, dysuria, back pain, penile discharge, testicular pain, or other symptoms.  Treatment attempted at home with miconazole cream which patient states is probably not effective because it is 54 years old.  The history is provided by the patient.    Past Medical History:  Diagnosis Date   Anxiety state, unspecified    Eosinophilic esophagitis 8469   by EGD with  biopsies   GERD (gastroesophageal reflux disease)    carafate added by Dr. Collene Mares   Hemorrhoids    Hypertension    Irritable bowel syndrome    Polyp of colon, hyperplastic 01/08/2011   Juanita Craver, repeat due 2018   Treadmill stress test negative for angina pectoris 2003    Patient Active Problem List   Diagnosis Date Noted   Edema 12/17/2019   Prostatitis, acute 12/17/2019   Colon cancer screening 08/29/2018   New onset of headaches after age 57 08/29/2018   B12 deficiency 08/29/2018   Allergic rhinitis 04/25/2018   Aortic atherosclerosis ([place]) 11/21/2017   Acute pain of left shoulder 11/05/2017   Chronic right-sided low back pain without sciatica 09/08/2017   Biceps tendinitis 08/27/2017   Subacromial bursitis 03/15/2017   Fatty liver 03/15/2017   Vitamin D deficiency 12/23/2016   Restless legs 12/21/2016   Ulnar neuropathy at elbow of left upper extremity 06/29/2016   Family history of sudden death in brother 12-18-2015   Polyarthritis of multiple sites 12/18/2015   Chronic bilateral thoracic back pain 07/29/2015   Palpitations 07/29/2015   Essential hypertension 11/13/2014   Major depressive disorder, recurrent episode ([place])  11/03/2014   Obesity (BMI 30.0-34.9) 08/28/2012   Incidental pulmonary nodule, > 94m and < 866m07/03/2012   Tobacco abuse, 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 Authorizing Provider  azelastine (ASTELIN) 0.1 % nasal spray Place 1 spray into both nostrils 2 (two) times daily. 01/03/20   TuCrecencio McMD  celecoxib (CELEBREX) 100 MG capsule Take 1 capsule (100 mg total) by mouth 2 (two) times daily. 12/04/19   TuCrecencio McMD  ciprofloxacin (CIPRO) 500 MG tablet Take 0.5 tablets (250 mg total) by mouth 2 (two) times daily. 12/17/19   TuCrecencio McMD  clonazePAM (KLONOPIN) 1 MG tablet TAKE 1 TABLET BY MOUTH TWICE A DAY AS NEEDED FOR ANXIETY.max 60/month 01/03/20   TuCrecencio McMD  cyclobenzaprine (FLEXERIL)  10 MG tablet TAKE 1 TABLET BY MOUTH 3 TIMES DAILY AS NEEDED FOR MUSCLE SPASMS 12/04/19   Crecencio Mc, MD  DEXILANT 60 MG capsule TAKE 1 CAPSULE DAILY 02/26/20   Crecencio Mc, MD  doxazosin (CARDURA) 1 MG tablet Take 1 tablet (1 mg total) by mouth daily. 12/17/19   Crecencio Mc, MD  doxycycline (VIBRAMYCIN) 100 MG capsule Take 1 capsule (100 mg total) by mouth 2 (two) times daily. 02/22/20   [place], [place], PA-C  econazole nitrate 1 % cream Apply topically 2 (two) times daily. 03/30/19   Crecencio Mc, MD  ezetimibe (ZETIA) 10 MG tablet TAKE 1 TABLET DAILY 11/08/19   Crecencio Mc, MD  fluticasone (FLONASE) 50  MCG/ACT nasal spray Place 1 spray into both nostrils daily. 01/03/20   Crecencio Mc, MD  losartan-hydrochlorothiazide (HYZAAR) 100-12.5 MG tablet TAKE 1 TABLET DAILY 02/26/20   Crecencio Mc, MD  montelukast (SINGULAIR) 10 MG tablet Take 10 mg by mouth at bedtime.    [provider]  nystatin cream (MYCOSTATIN) Apply to affected area 2 times daily 03/06/20   Sharion Balloon, NP  PARoxetine (PAXIL) 20 MG tablet Take 1 tablet (20 mg total) by mouth daily. After dinner 06/06/18   Crecencio Mc, MD  predniSONE (STERAPRED UNI-PAK 21 TAB) 10 MG (21) TBPK tablet Take by mouth daily. Take 6 tabs by mouth daily  for 2 days, then 5 tabs for 2 days, then 4 tabs for 2 days, then 3 tabs for 2 days, 2 tabs for 2 days, then 1 tab by mouth daily for 2 days 02/18/20   Stacey Drain, [place], PA-C  RETIN-A 0.01 % gel  06/25/19   [provider]  rosuvastatin (CRESTOR) 5 MG tablet TAKE 1 TABLET DAILY 02/26/20   Crecencio Mc, MD  sildenafil (REVATIO) 20 MG tablet TAKE 3 TO 5 TABLETS BY MOUTH DAILY AS NEEDED 08/13/19   Crecencio Mc, MD  traMADol (ULTRAM) 50 MG tablet Take 1 tablet (50 mg total) by mouth 2 (two) times daily. 02/16/20   Crecencio Mc, MD    Family History Family History  Problem Relation Age of Onset   Hyperlipidemia Sister    Hyperlipidemia Brother    Heart attack Brother 42   Hyperlipidemia Mother    Hypertension Mother    Hyperlipidemia Father    Hypertension Father    BRCA 1/2 Maternal Aunt     Social History Social History   Tobacco Use   Smoking status: Former Smoker    Packs/day: 1.00    Years: 15.00    Pack years: 15.00    Types: Cigarettes    Quit date: 05/17/2011    Years since quitting: 8.8   Smokeless tobacco: Never Used  Vaping Use   Vaping Use: Never used  Substance Use Topics   Alcohol use: Yes    Comment: occasional   Drug use: No     Allergies   Penicillins   Review of Systems Review of Systems  Constitutional: Negative for  chills and fever.  HENT: Negative for ear pain and sore throat.   Eyes: Negative for pain and visual disturbance.  Respiratory: Negative for cough and shortness of breath.   Cardiovascular: Negative for chest pain and palpitations.  Gastrointestinal: Negative for abdominal pain and vomiting.  Genitourinary: Negative for discharge, dysuria, hematuria and testicular pain.  Musculoskeletal: Negative for arthralgias and back pain.  Skin: Positive for rash. Negative for color change.  Neurological: Negative  for seizures and syncope.  All other systems reviewed and are negative.    Physical Exam Triage Vital Signs ED Triage Vitals  Enc Vitals Group     BP      Pulse      Resp      Temp      Temp src      SpO2      Weight      Height      Head Circumference      Peak Flow      Pain Score      Pain Loc      Pain Edu?      Excl. in [place]?    No data found.  Updated Vital Signs BP (!) 145/82    Pulse 99    Temp 98.9 F (37.2 C) (Oral)    Resp 16    Ht _0  (1.753 m)    Wt 195 lb (88.5 kg)    BMI 28.80 kg/m   Visual Acuity Right Eye Distance:   Left Eye Distance:   Bilateral Distance:    Right Eye Near:   Left Eye Near:    Bilateral Near:     Physical Exam Vitals and nursing note reviewed.  Constitutional:      General: He is not in acute distress.    Appearance: He is well-developed. He is not ill-appearing.  HENT:     Head: Normocephalic and atraumatic.     Mouth/Throat:     Mouth: Mucous membranes are moist.  Eyes:     Conjunctiva/sclera: Conjunctivae normal.  Cardiovascular:     Rate and Rhythm: Normal rate and regular rhythm.     Heart sounds: No murmur heard.   Pulmonary:     Effort: Pulmonary effort is normal. No respiratory distress.     Breath sounds: Normal breath sounds.  Abdominal:     Palpations: Abdomen is soft.     Tenderness: There is no abdominal tenderness.  Genitourinary:    Comments: Erythema under foreskin.  No penile discharge.     Musculoskeletal:     Cervical back: Neck supple.  Skin:    General: Skin is warm and dry.  Neurological:     General: No focal deficit present.     Mental Status: He is alert and oriented to person, place, and time.     Gait: Gait normal.  Psychiatric:        Mood and Affect: Mood normal.        Behavior: Behavior normal.      UC Treatments / Results  Labs (all labs ordered are listed, but only abnormal results are displayed) Labs Reviewed - No data to display  EKG   Radiology No results found.  Procedures Procedures (including critical care time)  Medications Ordered in UC Medications - No data to display  Initial Impression / Assessment and Plan / UC Course  I have reviewed the triage vital signs and the nursing notes.  Pertinent labs & imaging results that were available during my care of the patient were reviewed by me and considered in my medical decision making (see chart for details).   Candidal balanitis.  Treating with nystatin cream.  Instructed patient to follow-up with his PCP if his symptoms or not improving.  Education provided about candidal infection.  Patient declines STD testing.  He agrees to plan of care.       Final Clinical Impressions(s) / UC Diagnoses   Final diagnoses:  Candidal balanitis     Discharge Instructions     Use the antifungal cream as directed.    Follow up with your primary care provider if your symptoms are not improving.       ED Prescriptions    Medication Sig Dispense Auth. Provider   nystatin cream (MYCOSTATIN) Apply to affected area 2 times daily 30 g Sharion Balloon, NP     PDMP not reviewed this encounter.   Sharion Balloon, NP 03/06/20 1547

## 2020-03-06 NOTE — Discharge Instructions (Signed)
Use the antifungal cream as directed.  Follow up with your primary care provider if your symptoms are not improving.     

## 2020-05-02 IMAGING — CT CT HEART SCORING
2 series · 16 of 20 positions shown, 18 images · non-contrast
Comparison: Chest CT 02/18/2012

EXAM:
OVER-READ INTERPRETATION  CT CHEST

The following report is an over-read performed by radiologist Dr.
Jesdiel Herbalcoach [REDACTED] on 05/29/2018. This over-read
does not include interpretation of cardiac or coronary anatomy or
pathology. The coronary calcium score interpretation by the
cardiologist is attached.
CLINICAL DATA: Risk stratification
Coronary Calcium Score
TECHNIQUE: The patient was scanned on a Siemens Somatom 64 slice scanner. Axial
non-contrast 3mm slices were carried out through the heart. The data
set was analyzed on a dedicated work station and scored using the
Agatson method.

[Series 2: casc 3.0 i36f 2 bestdiast 64 % · axial · 0.38mm/px · z∈[-259,-172]mm · 8 of 39 slices shown, 10 images]
[im 5/39  vessel]
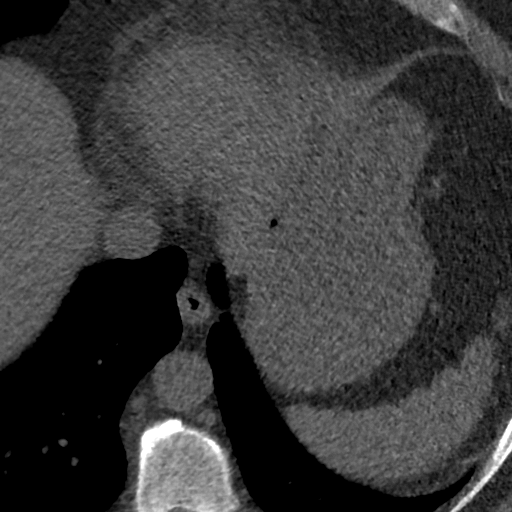
[im 5/39  lung]
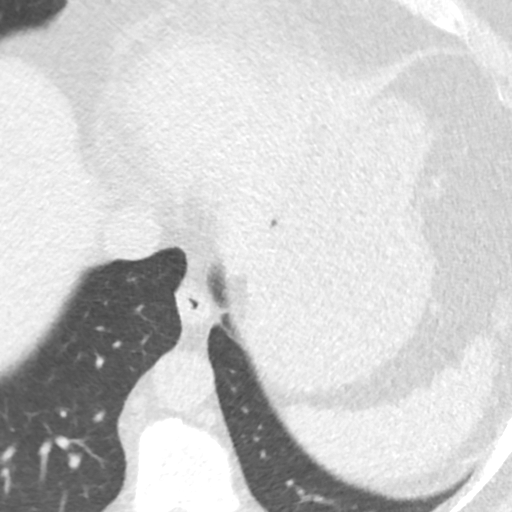
[im 9/39  vessel]
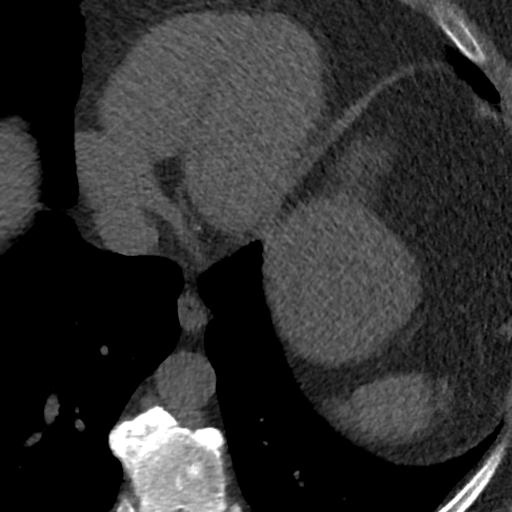
[im 13/39  vessel]
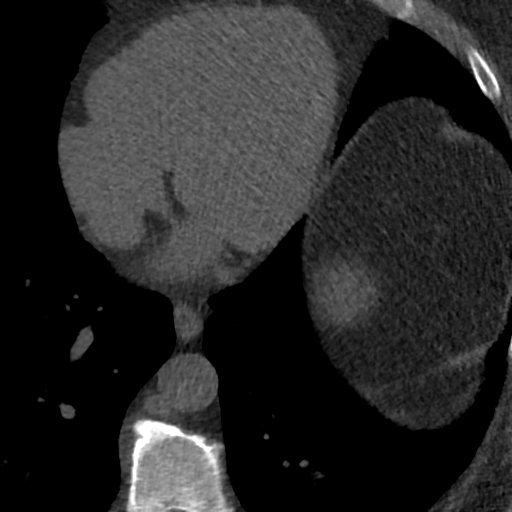
[im 17/39  vessel]
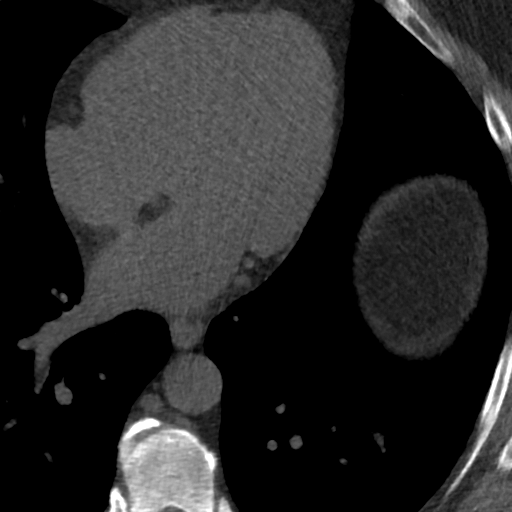
[im 22/39  vessel]
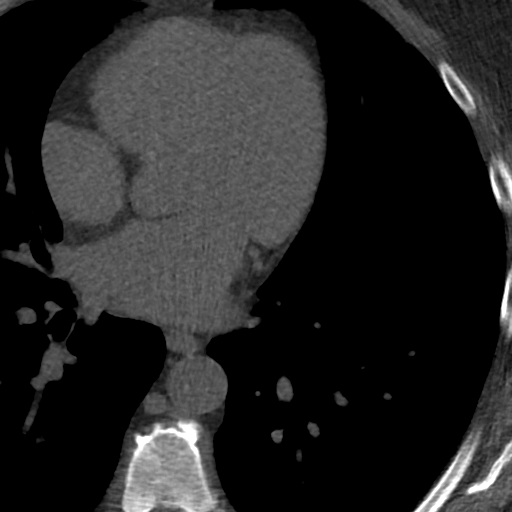
[im 22/39  lung]
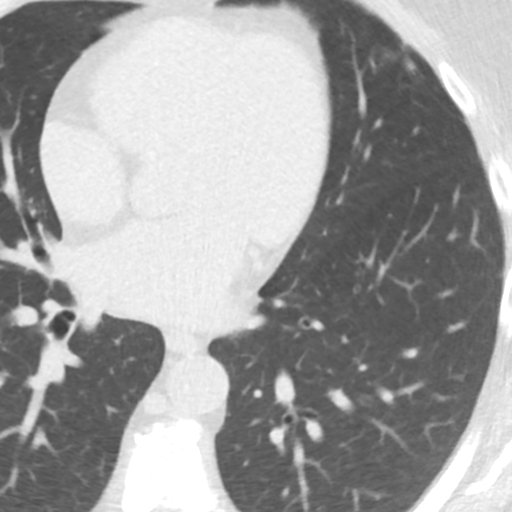
[im 26/39  vessel]
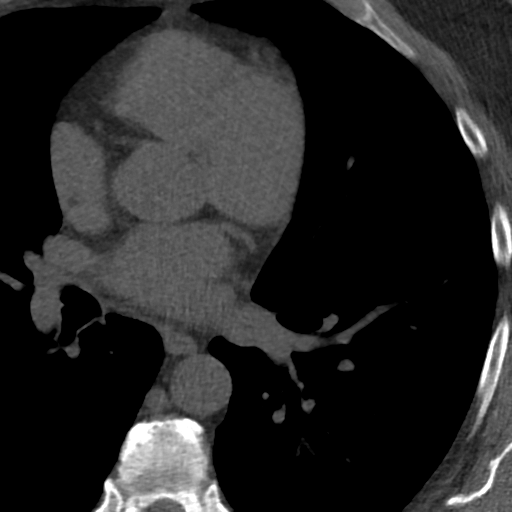
[im 30/39  vessel]
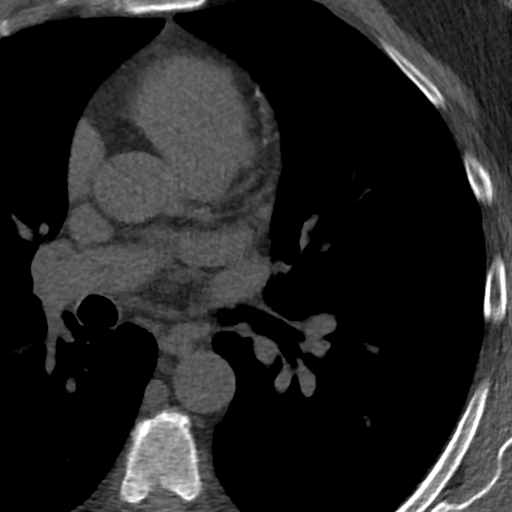
[im 34/39  vessel]
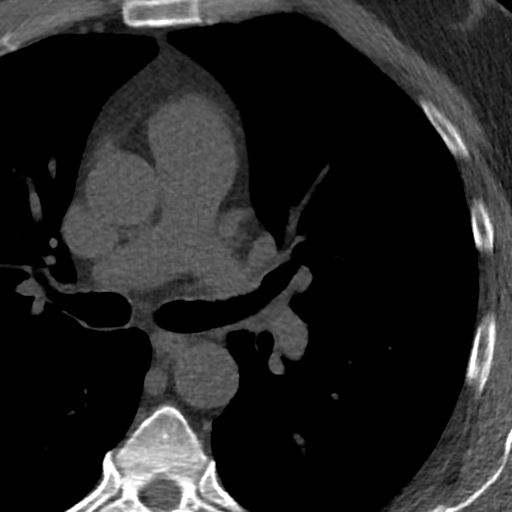

[Series 4: lung st 69 % · axial · 0.71mm/px · z∈[-258,-172]mm · 8 of 39 slices shown]
[im 5/39  lung]
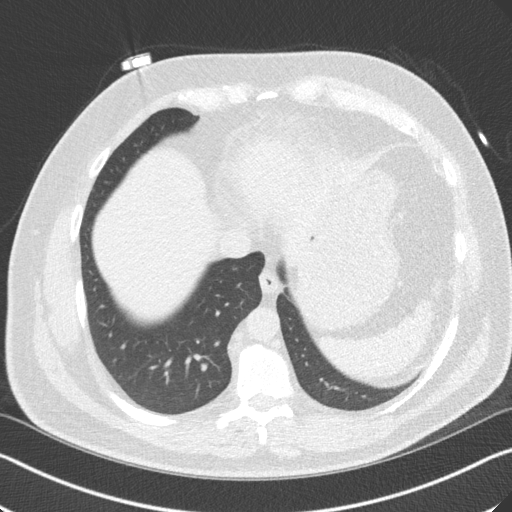
[im 9/39  lung]
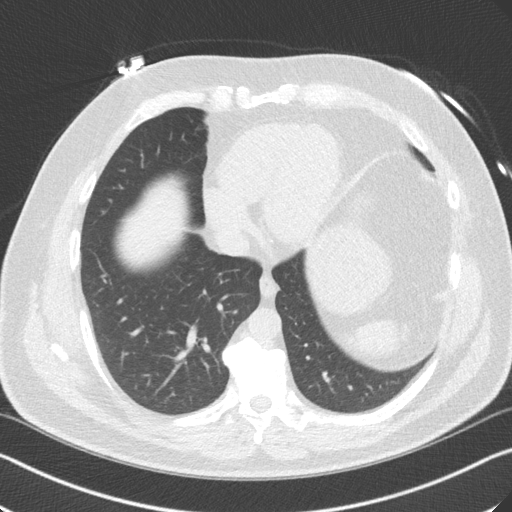
[im 13/39  lung]
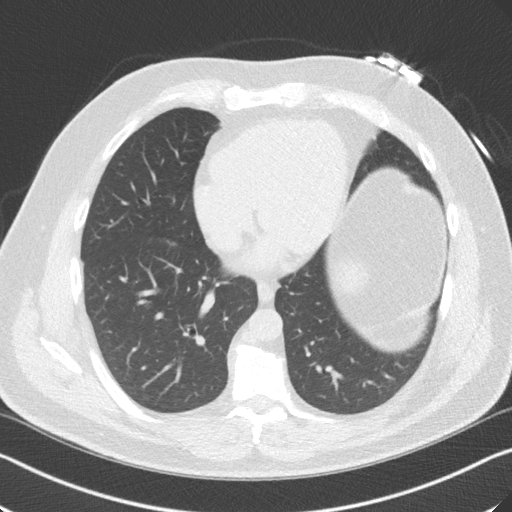
[im 17/39  lung]
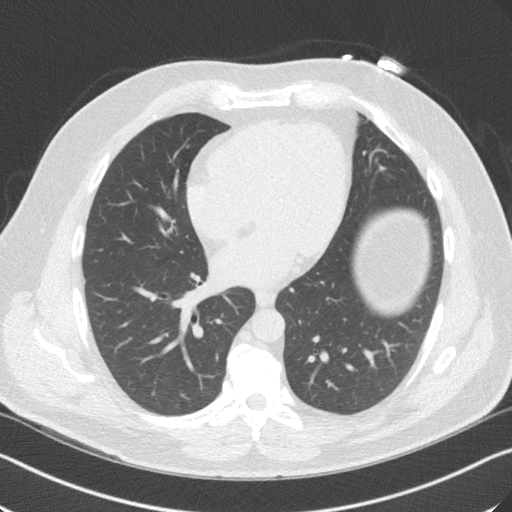
[im 22/39  lung]
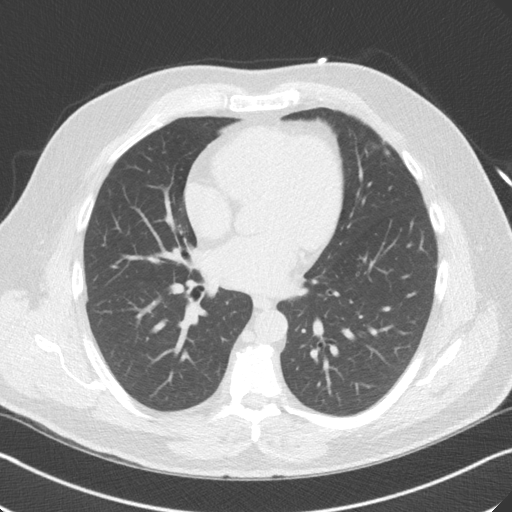
[im 26/39  lung]
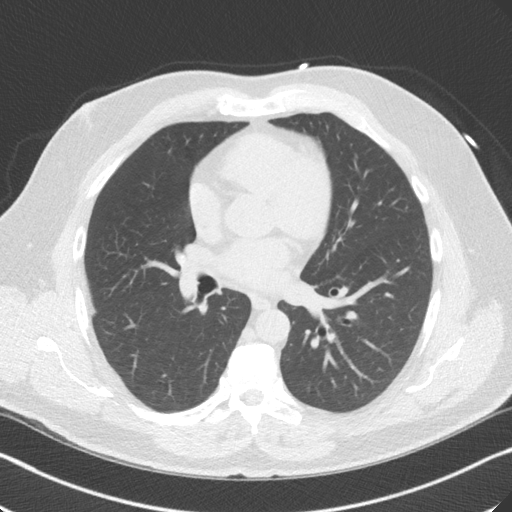
[im 30/39  lung]
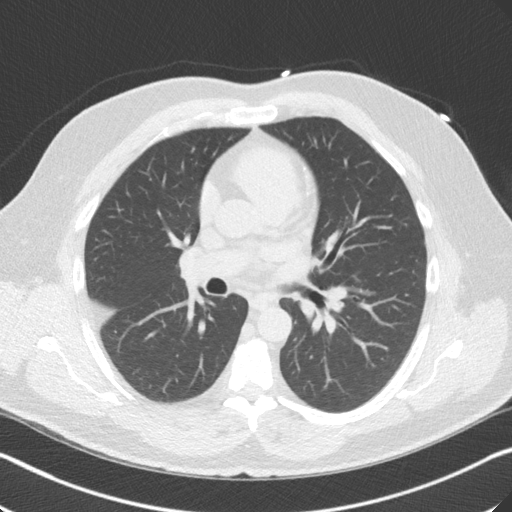
[im 34/39  lung]
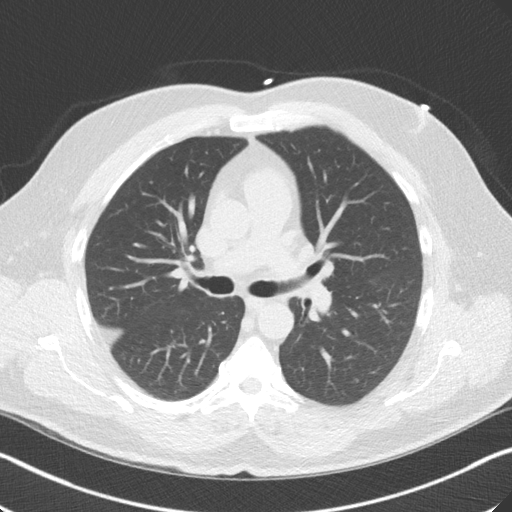

[16 of 20 positions shown; findings below may reference images not displayed]

FINDINGS: Heart is normal size. Visualized aorta is normal caliber. No
adenopathy in the lower mediastinum or hila. The visualized lungs
are clear. No effusions.

Imaging into the upper abdomen shows no acute findings. Chest wall
soft tissues are unremarkable. No acute bony abnormality.
IMPRESSION: No acute or significant extracardiac abnormality.
FINDINGS: Non-cardiac: No significant non cardiac findings on limited lung and
soft tissue windows. See separate report from [REDACTED].

Ascending Aorta: Normal diameter 3.0 cm

Pericardium: Normal

Coronary arteries: Punctate calcium noted in proximal mid and distal
LAD
IMPRESSION: Coronary calcium score of 14. This was 68 th percentile for age and
sex matched control.

Frank-Leander Severjanen

## 2020-05-05 ENCOUNTER — Ambulatory Visit (INDEPENDENT_AMBULATORY_CARE_PROVIDER_SITE_OTHER): Payer: BC Managed Care – PPO

## 2020-05-05 ENCOUNTER — Other Ambulatory Visit: Payer: Self-pay

## 2020-05-05 DIAGNOSIS — Z23 Encounter for immunization: Secondary | ICD-10-CM | POA: Diagnosis not present

## 2020-06-19 ENCOUNTER — Ambulatory Visit
Admission: EM | Admit: 2020-06-19 | Discharge: 2020-06-19 | Disposition: A | Discharge status: Home / Self Care | Payer: BC Managed Care – PPO | Attending: Family Medicine | Admitting: Family Medicine

## 2020-06-19 DIAGNOSIS — R197 Diarrhea, unspecified: Secondary | ICD-10-CM

## 2020-06-19 DIAGNOSIS — R110 Nausea: Secondary | ICD-10-CM

## 2020-06-19 DIAGNOSIS — R6883 Chills (without fever): Secondary | ICD-10-CM

## 2020-06-19 DIAGNOSIS — B349 Viral infection, unspecified: Secondary | ICD-10-CM

## 2020-06-19 DIAGNOSIS — R5383 Other fatigue: Secondary | ICD-10-CM

## 2020-06-19 DIAGNOSIS — Z1152 Encounter for screening for COVID-19: Secondary | ICD-10-CM

## 2020-06-19 DIAGNOSIS — R109 Unspecified abdominal pain: Secondary | ICD-10-CM

## 2020-06-19 DIAGNOSIS — R519 Headache, unspecified: Secondary | ICD-10-CM

## 2020-06-19 DIAGNOSIS — R52 Pain, unspecified: Secondary | ICD-10-CM

## 2020-06-19 MED ORDER — ONDANSETRON 4 MG PO TBDP
4.0000 mg | ORAL_TABLET | Freq: Once | ORAL | Status: AC
  Administered 2020-06-19: 4 mg via ORAL

## 2020-06-19 MED ORDER — ONDANSETRON HCL 4 MG PO TABS: 4.0000 mg | ORAL_TABLET | Freq: Four times a day (QID) | ORAL | 0 refills | Status: DC

## 2020-06-19 NOTE — Discharge Instructions (Signed)
I have sent in Zofran for you to take one tablet every 6 hours as needed for nausea  Your COVID/flu test is pending.  You should self quarantine until the test result is back.    Take Tylenol as needed for fever or discomfort.  Rest and keep yourself hydrated.    Go to the emergency department if you develop acute worsening symptoms.

## 2020-06-19 NOTE — ED Triage Notes (Signed)
Pt reports having nausea, diarrhea, body aches and chills x2 days. Pt has been exposed someone with covid about 2 weeks ago.

## 2020-06-19 NOTE — ED Provider Notes (Signed)
[place]   [id number] 06/19/20 Arrival Time: 4196   CC: COVID symptoms  SUBJECTIVE: History from: patient.  Ronald Cordova is a 54 y.o. male who presents with abrupt onset of nasal congestion, PND, abdominal cramping, diarrhea, fatigue, malaise, headaches, chills for the last 2 days. Has hx Celiac disease, IBS, anxiety, depression. Reports that the abdominal cramping feels different than his usual IBS symptoms. Reports that there have been 14 recent Covid cases at work. 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 Denies recent travel. Has negative history of Covid. Has completed Covid vaccines. Has not taken OTC medications for this. There are no aggravating or alleviating factors. Denies previous symptoms in the past. Denies fever, sinus pain, rhinorrhea, sore throat, SOB, wheezing, chest pain, nausea, changes in bladder habits.    ROS: As per HPI.  All other pertinent ROS negative.     Past Medical History:  Diagnosis Date  . Anxiety state, unspecified   . Eosinophilic esophagitis 2229   by EGD with  biopsies  . GERD (gastroesophageal reflux disease)    carafate added by Dr. Collene Mares  . Hemorrhoids   . Hypertension   . Irritable bowel syndrome   . Polyp of colon, hyperplastic 01/08/2011   Juanita Craver, repeat due 2018  . Treadmill stress test negative for angina pectoris 2003   Past Surgical History:  Procedure Laterality Date  . APPENDECTOMY     done during high school  . carpal tunnel release  Sept 2008   R hand, by  [place], [place] ARTHROSCOPY  2000   left  . NASAL SINUS SURGERY  1998   bilateral   Allergies  Allergen Reactions  . Penicillins Hives    Has patient had a PCN reaction causing immediate rash, facial/tongue/throat swelling, SOB or lightheadedness with hypotension: No Has patient had a PCN reaction causing severe rash involving mucus membranes or skin necrosis: No Has  patient had a PCN reaction that required hospitalization: No Has patient had a PCN reaction occurring within the last 10 years: No If all of the above answers are "NO", then may proceed with Cephalosporin use.    No current facility-administered medications on file prior to encounter.   Current Outpatient Medications on File Prior to Encounter  Medication Sig Dispense Refill  . azelastine (ASTELIN) 0.1 % nasal spray Place 1 spray into both nostrils 2 (two) times daily. 30 mL 1  . celecoxib (CELEBREX) 100 MG capsule Take 1 capsule (100 mg total) by mouth 2 (two) times daily. 180 capsule 1  . ciprofloxacin (CIPRO) 500 MG tablet Take 0.5 tablets (250 mg total) by mouth 2 (two) times daily. 28 tablet 0  . clonazePAM (KLONOPIN) 1 MG tablet TAKE 1 TABLET BY MOUTH TWICE A DAY AS NEEDED FOR ANXIETY.max 60/month 45 tablet 3  . cyclobenzaprine (FLEXERIL) 10 MG tablet TAKE 1 TABLET BY MOUTH 3 TIMES DAILY AS NEEDED FOR MUSCLE SPASMS 270 tablet 1  . DEXILANT 60 MG capsule TAKE 1 CAPSULE DAILY 90 capsule 1  . doxazosin (CARDURA) 1 MG tablet Take 1 tablet (1 mg total) by mouth daily. 90 tablet 0  . doxycycline (VIBRAMYCIN) 100 MG capsule Take 1 capsule (100 mg total) by mouth 2 (two) times daily. 20 capsule 0  . econazole nitrate 1 % cream Apply topically 2 (two) times daily. 15 g 2  . ezetimibe (ZETIA) 10 MG tablet TAKE 1  TABLET DAILY 90 tablet 2  . fluticasone (FLONASE) 50 MCG/ACT nasal spray Place 1 spray into both nostrils daily. 16 g 1  . losartan-hydrochlorothiazide (HYZAAR) 100-12.5 MG tablet TAKE 1 TABLET DAILY 90 tablet 1  . montelukast (SINGULAIR) 10 MG tablet Take 10 mg by mouth at bedtime.    Marland Kitchen nystatin cream (MYCOSTATIN) Apply to affected area 2 times daily 30 g 0  . PARoxetine (PAXIL) 20 MG tablet Take 1 tablet (20 mg total) by mouth daily. After dinner 90 tablet 2  . predniSONE (STERAPRED UNI-PAK 21 TAB) 10 MG (21) TBPK tablet Take by mouth daily. Take 6 tabs by mouth daily  for 2 days, then  5 tabs for 2 days, then 4 tabs for 2 days, then 3 tabs for 2 days, 2 tabs for 2 days, then 1 tab by mouth daily for 2 days 42 tablet 0  . RETIN-A 0.01 % gel     . rosuvastatin (CRESTOR) 5 MG tablet TAKE 1 TABLET DAILY 90 tablet 1  . sildenafil (REVATIO) 20 MG tablet TAKE 3 TO 5 TABLETS BY MOUTH DAILY AS NEEDED 90 tablet 0  . traMADol (ULTRAM) 50 MG tablet Take 1 tablet (50 mg total) by mouth 2 (two) times daily. 60 tablet 5   Social History   Socioeconomic History  . Marital status: Single    Spouse name: Not on file  . Number of children: Not on file  . Years of education: Not on file  . Highest education level: Not on file  Occupational History  . Not on file  Tobacco Use  . Smoking status: Former Smoker    Packs/day: 1.00    Years: 15.00    Pack years: 15.00    Types: Cigarettes    Quit date: 05/17/2011    Years since quitting: 9.1  . Smokeless tobacco: Never Used  Vaping Use  . Vaping Use: Never used  Substance and Sexual Activity  . Alcohol use: Yes    Comment: occasional  . Drug use: No  . Sexual activity: Yes    Partners: Male  Other Topics Concern  . Not on file  Social History Narrative  . Not on file   Social Determinants of Health   Financial Resource Strain:   . Difficulty of Paying Living Expenses: Not on file  Food Insecurity:   . Worried About Charity fundraiser in the Last Year: Not on file  . Ran Out of Food in the Last Year: Not on file  Transportation Needs:   . Lack of Transportation (Medical): Not on file  . Lack of Transportation (Non-Medical): Not on file  Physical Activity:   . Days of Exercise per Week: Not on file  . Minutes of Exercise per Session: Not on file  Stress:   . Feeling of Stress : Not on file  Social Connections:   . Frequency of Communication with Friends and Family: Not on file  . Frequency of Social Gatherings with Friends and Family: Not on file  . Attends Religious Services: Not on file  . Active Member of Clubs or  Organizations: Not on file  . Attends Archivist Meetings: Not on file  . Marital Status: Not on file  Intimate Partner Violence:   . Fear of Current or Ex-Partner: Not on file  . Emotionally Abused: Not on file  . Physically Abused: Not on file  . Sexually Abused: Not on file   Family History  Problem Relation Age of Onset  .  Hyperlipidemia Sister   . Hyperlipidemia Brother   . Heart attack Brother 40  . Hyperlipidemia Mother   . Hypertension Mother   . Hyperlipidemia Father   . Hypertension Father   . BRCA 1/2 Maternal Aunt     OBJECTIVE:  Vitals:   06/19/20 1524 06/19/20 1525  BP: 127/79   Pulse: 80   Resp: 18   Temp: 98.8 F (37.1 C)   TempSrc: Oral   SpO2: 96%   Weight:  200 lb (90.7 kg)  Height:  '5\' 9"'  (1.753 m)     General appearance: alert; appears fatigued, but nontoxic; speaking in full sentences and tolerating own secretions HEENT: NCAT; Ears: EACs clear, TMs pearly gray; Eyes: PERRL.  EOM grossly intact. Sinuses: nontender; Nose: nares patent without rhinorrhea, Throat: oropharynx clear, tonsils non erythematous or enlarged, uvula midline  Neck: supple without LAD Lungs: unlabored respirations, symmetrical air entry; cough: absent; no respiratory distress; CTAB Heart: regular rate and rhythm.  Radial pulses 2+ symmetrical bilaterally Skin: warm and dry Psychological: alert and cooperative; normal mood and affect  LABS:  No results found for this or any previous visit (from the past 24 hour(s)).   ASSESSMENT & PLAN:  1. Viral illness   2. Encounter for screening for COVID-19   3. Diarrhea, unspecified type   4. Abdominal cramping   5. Other fatigue   6. Nausea   7. Chills   8. Aches   9. Nonintractable headache, unspecified chronicity pattern, unspecified headache type     Meds ordered this encounter  Medications  . ondansetron (ZOFRAN-ODT) disintegrating tablet 4 mg  . ondansetron (ZOFRAN) 4 MG tablet    Sig: Take 1 tablet (4 mg  total) by mouth every 6 (six) hours.    Dispense:  12 tablet    Refill:  0    Order Specific Question:   Supervising Provider    Answer:   Chase Picket [id number]   Zofran given in office Prescribed Zofran one tablet every 6 hours as needed for nausea. Work note provided COVID/Flu/RSV testing ordered.  It will take between 1-2 days for test results.  Someone will contact you regarding abnormal results.    Patient should remain in quarantine until they have received Covid results.  If negative you may resume normal activities (go back to work/school) while practicing hand hygiene, social distance, and mask wearing.  If positive, patient should remain in quarantine for 10 days from symptom onset AND greater than 72 hours after symptoms resolution (absence of fever without the use of fever-reducing medication and improvement in respiratory symptoms), whichever is longer Get plenty of rest and push fluids Use OTC zyrtec for nasal congestion, runny nose, and/or sore throat Use OTC flonase for nasal congestion and runny nose Use medications daily for symptom relief Use OTC medications like ibuprofen or tylenol as needed fever or pain Call or go to the ED if you have any new or worsening symptoms such as fever, worsening cough, shortness of breath, chest tightness, chest pain, turning blue, changes in mental status.  Reviewed expectations re: course of current medical issues. Questions answered. Outlined signs and symptoms indicating need for more acute intervention. Patient verbalized understanding. After Visit Summary given.         Faustino Congress, NP 06/20/20 1754

## 2020-06-21 LAB — COVID-19, FLU A+B AND RSV
Influenza A, NAA: NOT DETECTED
Influenza B, NAA: NOT DETECTED
RSV, NAA: NOT DETECTED
SARS-CoV-2, NAA: NOT DETECTED

## 2020-06-23 ENCOUNTER — Encounter: Payer: Self-pay | Admitting: Internal Medicine

## 2020-06-23 ENCOUNTER — Telehealth (INDEPENDENT_AMBULATORY_CARE_PROVIDER_SITE_OTHER): Payer: BC Managed Care – PPO | Admitting: Internal Medicine

## 2020-06-23 ENCOUNTER — Other Ambulatory Visit: Payer: Self-pay

## 2020-06-23 VITALS — BP 121/72 | Ht 69.0 in | Wt 200.0 lb

## 2020-06-23 DIAGNOSIS — M545 Low back pain, unspecified: Secondary | ICD-10-CM

## 2020-06-23 DIAGNOSIS — K760 Fatty (change of) liver, not elsewhere classified: Secondary | ICD-10-CM | POA: Diagnosis not present

## 2020-06-23 DIAGNOSIS — I10 Essential (primary) hypertension: Secondary | ICD-10-CM | POA: Diagnosis not present

## 2020-06-23 DIAGNOSIS — F411 Generalized anxiety disorder: Secondary | ICD-10-CM | POA: Diagnosis not present

## 2020-06-23 DIAGNOSIS — R5383 Other fatigue: Secondary | ICD-10-CM | POA: Diagnosis not present

## 2020-06-23 DIAGNOSIS — R911 Solitary pulmonary nodule: Secondary | ICD-10-CM

## 2020-06-23 DIAGNOSIS — G8929 Other chronic pain: Secondary | ICD-10-CM

## 2020-06-23 MED ORDER — ZOSTER VAC RECOMB ADJUVANTED 50 MCG/0.5ML IM SUSR: 0.5000 mL | Freq: Once | INTRAMUSCULAR | 1 refills | Status: AC

## 2020-06-23 MED ORDER — DOXAZOSIN MESYLATE 1 MG PO TABS: 1.0000 mg | ORAL_TABLET | Freq: Every day | ORAL | 2 refills | Status: DC

## 2020-06-23 MED ORDER — ZOSTER VAC RECOMB ADJUVANTED 50 MCG/0.5ML IM SUSR
0.5000 mL | Freq: Once | INTRAMUSCULAR | 1 refills | Status: DC
Start: 2020-06-23 — End: 2020-06-23

## 2020-06-23 MED ORDER — TIZANIDINE HCL 4 MG PO TABS
4.0000 mg | ORAL_TABLET | Freq: Three times a day (TID) | ORAL | 5 refills | Status: AC | PRN
Start: 2020-06-23 — End: ?

## 2020-06-23 MED ORDER — CLONAZEPAM 1 MG PO TABS: 1.0000 mg | ORAL_TABLET | Freq: Every day | ORAL | 5 refills | Status: DC

## 2020-06-23 NOTE — Progress Notes (Signed)
Virtual Visit via [place]  This visit type was conducted due to national recommendations for restrictions regarding the COVID-19 pandemic (e.g. social distancing).  This format is felt to be most appropriate for this patient at this time.  All issues noted in this document were discussed and addressed.  No physical exam was performed (except for noted visual exam findings with Video Visits).   I connected with@ on 06/23/20 at  1:30 PM EST by a video enabled telemedicine application and verified that I am speaking with the correct person using two identifiers. Location patient: home Location provider: work or home office Persons participating in the virtual visit: patient, provider  I discussed the limitations, risks, security and privacy concerns of performing an evaluation and management service by telephone and the availability of in person appointments. I also discussed with the patient that there may be a patient responsible charge related to this service. The patient expressed understanding and agreed to proceed.   Reason for visit: 6 month follow up on chronic conditions  HPI:   GAD: has been using clonazepam  #45/month ,  With recent use of topical lavender to manage daytime symptoms, he has been able to reduce use to once daily.     BPH symptoms:  Better controlled when he was using Cardura.  Has not started finasteride prescribed by Dermatology due to concern about the sexual side effects noted in the drug information.  These were discussed and the incident reported compared with those listed in  the Cardura drug info with patient today   Overweight:  He has been exercising less due to work schedule ; 14 hour shifts for [employer].   Reviewed diet as well   History of sub centimeter pulmonary nodules seen in 2013:  Reviewed images with patient along with 2019 CT (not seen )   ROS: See pertinent positives and negatives per HPI.  Past Medical History:  Diagnosis Date  . Anxiety  state, unspecified   . Eosinophilic esophagitis 4801   by EGD with  biopsies  . GERD (gastroesophageal reflux disease)    carafate added by Dr. Collene Mares  . Hemorrhoids   . Hypertension   . Irritable bowel syndrome   . Polyp of colon, hyperplastic 01/08/2011   Juanita Craver, repeat due 2018  . Treadmill stress test negative for angina pectoris 2003    Past Surgical History:  Procedure Laterality Date  . APPENDECTOMY     done during high school  . carpal tunnel release  Sept 2008   R hand, by  [place], [place] ARTHROSCOPY  2000   left  . NASAL SINUS SURGERY  1998   bilateral    Family History  Problem Relation Age of Onset  . Hyperlipidemia Sister   . Hyperlipidemia Brother   . Heart attack Brother 33  . Hyperlipidemia Mother   . Hypertension Mother   . Hyperlipidemia Father   . Hypertension Father   . BRCA 1/2 Maternal Aunt     SOCIAL HX:  reports that he quit smoking about 9 years ago. His smoking use included cigarettes. He has a 15.00 pack-year smoking history. He has never used smokeless tobacco. He reports current alcohol use. He reports that he does not use drugs.   Current Outpatient Medications:  .  clonazePAM (KLONOPIN) 1 MG tablet, Take 1 tablet (1 mg total) by mouth daily., Disp: 30 tablet, Rfl: 5 .  DEXILANT 60 MG capsule, TAKE 1  CAPSULE DAILY, Disp: 90 capsule, Rfl: 1 .  ezetimibe (ZETIA) 10 MG tablet, TAKE 1 TABLET DAILY, Disp: 90 tablet, Rfl: 2 .  fluticasone (FLONASE) 50 MCG/ACT nasal spray, Place 1 spray into both nostrils daily., Disp: 16 g, Rfl: 1 .  losartan-hydrochlorothiazide (HYZAAR) 100-12.5 MG tablet, TAKE 1 TABLET DAILY, Disp: 90 tablet, Rfl: 1 .  meloxicam (MOBIC) 7.5 MG tablet, Take 7.5 mg by mouth daily., Disp: , Rfl:  .  methocarbamol (ROBAXIN) 750 MG tablet, Take 750 mg by mouth every 8 (eight) hours., Disp: , Rfl:  .  nystatin cream (MYCOSTATIN), Apply to affected area 2 times daily, Disp: 30 g, Rfl: 0 .   ondansetron (ZOFRAN) 4 MG tablet, Take 1 tablet (4 mg total) by mouth every 6 (six) hours., Disp: 12 tablet, Rfl: 0 .  RETIN-A 0.01 % gel, , Disp: , Rfl:  .  rosuvastatin (CRESTOR) 5 MG tablet, TAKE 1 TABLET DAILY, Disp: 90 tablet, Rfl: 1 .  sildenafil (REVATIO) 20 MG tablet, TAKE 3 TO 5 TABLETS BY MOUTH DAILY AS NEEDED, Disp: 90 tablet, Rfl: 0 .  traMADol (ULTRAM) 50 MG tablet, Take 1 tablet (50 mg total) by mouth 2 (two) times daily., Disp: 60 tablet, Rfl: 5 .  triamcinolone ointment (KENALOG) 0.1 %, Apply 1 application topically 2 (two) times daily., Disp: , Rfl:  .  doxazosin (CARDURA) 1 MG tablet, Take 1 tablet (1 mg total) by mouth daily., Disp: 90 tablet, Rfl: 2 .  montelukast (SINGULAIR) 10 MG tablet, Take 10 mg by mouth at bedtime. (Patient not taking: Reported on 06/23/2020), Disp: , Rfl:  .  tiZANidine (ZANAFLEX) 4 MG tablet, Take 1 tablet (4 mg total) by mouth every 8 (eight) hours as needed for muscle spasms., Disp: 60 tablet, Rfl: 5  EXAM:  VITALS per patient if applicable:  GENERAL: alert, oriented, appears well and in no acute distress  HEENT: atraumatic, conjunttiva clear, no obvious abnormalities on inspection of external nose and ears  NECK: normal movements of the head and neck  LUNGS: on inspection no signs of respiratory distress, breathing rate appears normal, no obvious gross SOB, gasping or wheezing  CV: no obvious cyanosis  MS: moves all visible extremities without noticeable abnormality  PSYCH/NEURO: pleasant and cooperative, no obvious depression or anxiety, speech and thought processing grossly intact  ASSESSMENT AND PLAN:  Discussed the following assessment and plan:  Fatigue, unspecified type - Plan: Testos,Total,Free and SHBG (Male), TSH, CBC with Differential/Platelet  Essential hypertension - Plan: Comprehensive metabolic panel  Fatty liver - Plan: Lipid panel  Anxiety state  Chronic right-sided low back pain without sciatica  Incidental  pulmonary nodule, > 36m and < 858m Anxiety state Improved with use of essential oils (lavender).  reducing clonazepam rx to #30/month . Refill history confirmed via [place] Controlled Substance databas, accessed by me today..  Chronic right-sided low back pain without sciatica He is using scheduled celebrex and prn Tramadol for moderate pain , maximum 2 daily. 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    Refill history confirmed via [place] Controlled Substance databas, accessed by me today.  Incidental pulmonary nodule, > 63m39mnd < 8mm66mdule was not seen on 2019 Chest CT done during cardiac CT.  No further workup needed     I discussed the assessment and treatment plan with the patient. The patient was provided an opportunity to ask questions and all were answered. The  patient agreed with the plan and demonstrated an understanding of the instructions.   The patient was advised to call back or seek an in-person evaluation if the symptoms worsen or if the condition fails to improve as anticipated.  I provided  30 minutes of face-to-face time during this encounter.   Crecencio Mc, MD

## 2020-06-23 NOTE — Patient Instructions (Signed)
The tiny pulmonary nodules seen in 2013 on your Chest CT were not seen on the Cardiac CT that was done in 2019 so they do not need any further workup  I have ordered fasting labs and testosterone levels . The office will call you with a LAB APPT  I HAVE REFILLED THE CLONAZEPAM AND DOXAZOSIN

## 2020-06-24 NOTE — Assessment & Plan Note (Signed)
Nodule was not seen on 2019 Chest CT done during cardiac CT.  No further workup needed

## 2020-06-24 NOTE — Assessment & Plan Note (Signed)
He is using scheduled celebrex and prn Tramadol for moderate pain , maximum 2 daily. 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    Refill history confirmed via [place] Controlled Substance databas, accessed by me today. 

## 2020-06-24 NOTE — Assessment & Plan Note (Signed)
Improved with use of essential oils (lavender).  reducing clonazepam rx to #30/month . Refill history confirmed via [place] Controlled Substance databas, accessed by me today.Marland Kitchen

## 2020-07-09 ENCOUNTER — Other Ambulatory Visit: Payer: Self-pay | Admitting: Internal Medicine

## 2020-07-16 ENCOUNTER — Telehealth: Payer: Self-pay | Admitting: Internal Medicine

## 2020-07-16 MED ORDER — MELOXICAM 7.5 MG PO TABS: 7.5000 mg | ORAL_TABLET | Freq: Every day | ORAL | 1 refills | Status: DC

## 2020-07-16 NOTE — Telephone Encounter (Signed)
Patient called in for refill for meloxicam (MOBIC) 7.5 MG tablet wants it sent to [facility]

## 2020-07-17 ENCOUNTER — Other Ambulatory Visit: Payer: Self-pay | Admitting: Internal Medicine

## 2020-07-21 ENCOUNTER — Other Ambulatory Visit: Payer: Self-pay

## 2020-07-21 ENCOUNTER — Other Ambulatory Visit (INDEPENDENT_AMBULATORY_CARE_PROVIDER_SITE_OTHER): Payer: BC Managed Care – PPO

## 2020-07-21 DIAGNOSIS — R5383 Other fatigue: Secondary | ICD-10-CM

## 2020-07-21 DIAGNOSIS — K760 Fatty (change of) liver, not elsewhere classified: Secondary | ICD-10-CM | POA: Diagnosis not present

## 2020-07-21 DIAGNOSIS — I10 Essential (primary) hypertension: Secondary | ICD-10-CM

## 2020-07-21 LAB — LIPID PANEL
Cholesterol: 132 mg/dL (ref 0–200)
HDL: 43.2 mg/dL (ref >39.00)
LDL Cholesterol: 68 mg/dL (ref 0–99)
NonHDL: 88.67
Total CHOL/HDL Ratio: 3
Triglycerides: 105 mg/dL (ref 0.0–149.0)
VLDL: 21 mg/dL (ref 0.0–40.0)

## 2020-07-21 LAB — COMPREHENSIVE METABOLIC PANEL
ALT: 30 U/L (ref 0–53)
AST: 17 U/L (ref 0–37)
Albumin: 4.4 g/dL (ref 3.5–5.2)
Alkaline Phosphatase: 48 U/L (ref 39–117)
BUN: 16 mg/dL (ref 6–23)
CO2: 29 mEq/L (ref 19–32)
Calcium: 9.6 mg/dL (ref 8.4–10.5)
Chloride: 105 mEq/L (ref 96–112)
Creatinine, Ser: 0.9 mg/dL (ref 0.40–1.50)
GFR: 97.31 mL/min (ref >60.00)
Glucose, Bld: 92 mg/dL (ref 70–99)
Potassium: 4.3 mEq/L (ref 3.5–5.1)
Sodium: 141 mEq/L (ref 135–145)
Total Bilirubin: 0.6 mg/dL (ref 0.2–1.2)
Total Protein: 6.8 g/dL (ref 6.0–8.3)

## 2020-07-21 LAB — CBC WITH DIFFERENTIAL/PLATELET
Basophils Absolute: 0 10*3/uL (ref 0.0–0.1)
Basophils Relative: 0.4 % (ref 0.0–3.0)
Eosinophils Absolute: 0.2 10*3/uL (ref 0.0–0.7)
Eosinophils Relative: 3.3 % (ref 0.0–5.0)
HCT: 45.5 % (ref 39.0–52.0)
Hemoglobin: 15.2 g/dL (ref 13.0–17.0)
Lymphocytes Relative: 37.4 % (ref 12.0–46.0)
Lymphs Abs: 2.7 10*3/uL (ref 0.7–4.0)
MCHC: 33.4 g/dL (ref 30.0–36.0)
MCV: 93.1 fl (ref 78.0–100.0)
Monocytes Absolute: 0.5 10*3/uL (ref 0.1–1.0)
Monocytes Relative: 7 % (ref 3.0–12.0)
Neutro Abs: 3.8 10*3/uL (ref 1.4–7.7)
Neutrophils Relative %: 51.9 % (ref 43.0–77.0)
Platelets: 248 10*3/uL (ref 150.0–400.0)
RBC: 4.89 Mil/uL (ref 4.22–5.81)
RDW: 13.1 % (ref 11.5–15.5)
WBC: 7.3 10*3/uL (ref 4.0–10.5)

## 2020-07-21 LAB — TSH: TSH: 0.88 u[IU]/mL (ref 0.35–4.50)

## 2020-07-24 LAB — TESTOS,TOTAL,FREE AND SHBG (FEMALE)
Free Testosterone: 133.3 pg/mL (ref 35.0–155.0)
Sex Hormone Binding: 20 nmol/L (ref 10–50)
Testosterone, Total, LC-MS-MS: 533 ng/dL (ref 250–1100)

## 2020-07-24 NOTE — Progress Notes (Signed)
Your CBC, thyroid , cholesterol,  liver and kidney function are normal.  Please continue your current medications and  plan to repeat  Non fasting labs in 6 months.   Regards,  Dr. Darrick Huntsman

## 2020-07-24 NOTE — Progress Notes (Signed)
Scott,   Your testosterone levels are normal. There is no indication for supplementation based on the values reported on your recent blood test.   Regards,   Duncan Dull, MD

## 2020-07-25 ENCOUNTER — Telehealth: Payer: Self-pay

## 2020-07-25 MED ORDER — DOXAZOSIN MESYLATE 2 MG PO TABS
2.0000 mg | ORAL_TABLET | Freq: Every day | ORAL | 1 refills | Status: DC
Start: 2020-07-25 — End: 2020-12-24

## 2020-07-25 NOTE — Telephone Encounter (Signed)
  Caller states that his BP has been running 140/95 and that is high for him. He would like a message to be sent to the office informing Dr. Darrick Huntsman. Additional Comment Caller declined triage and just wanted a message to be sent to the office letting Dr. Darrick Huntsman and her RN know about his BP reading. He is already on two blood pressure medications so he would like a call back to see if Dr. Darrick Huntsman would like to change anything.        [facility] Night - Cl Nonclinical Telephone Record  [facility] [place] - Cl Client Site [facility] - Night Physician Duncan Dull - MD Contact Type Call Who Is Calling Patient / Member / Family / Caregiver Caller Name Ronald Cordova Caller Phone Number [phone] Patient Name Ronald Cordova Patient DOB [birth year] Call Type Message Only Information Provided Reason for Call Request for General Office Information Initial Comment Caller states that his BP has been running 140/95 and that is high for him. He would like a message to be sent to the office informing Dr. Darrick Huntsman. Additional Comment Caller declined triage and just wanted a message to be sent to the office letting Dr. Darrick Huntsman and her RN know about his BP reading. He is already on two blood pressure medications so he would like a call back to see if Dr. Darrick Huntsman would like to change anything. Disp. Time Disposition Final User 07/25/2020 7:46:22 AM General Information Provided Yes Cherylynn Ridges Call Closed By: Cherylynn Ridges Transaction Date/Time: 07/25/2020 7:41:38 AM (ET)

## 2020-07-25 NOTE — Addendum Note (Signed)
Addended by: Sherlene Shams on: 07/25/2020 04:55 PM   Modules accepted: Orders

## 2020-08-03 ENCOUNTER — Encounter: Payer: Self-pay | Admitting: Emergency Medicine

## 2020-08-03 ENCOUNTER — Ambulatory Visit
Admission: EM | Admit: 2020-08-03 | Discharge: 2020-08-03 | Disposition: A | Discharge status: Home / Self Care | Payer: BC Managed Care – PPO | Attending: Emergency Medicine | Admitting: Emergency Medicine

## 2020-08-03 DIAGNOSIS — R509 Fever, unspecified: Secondary | ICD-10-CM | POA: Diagnosis not present

## 2020-08-03 DIAGNOSIS — J029 Acute pharyngitis, unspecified: Secondary | ICD-10-CM

## 2020-08-03 DIAGNOSIS — R059 Cough, unspecified: Secondary | ICD-10-CM | POA: Diagnosis not present

## 2020-08-03 DIAGNOSIS — J0101 Acute recurrent maxillary sinusitis: Secondary | ICD-10-CM | POA: Diagnosis present

## 2020-08-03 LAB — POCT RAPID STREP A (OFFICE): Rapid Strep A Screen: NEGATIVE

## 2020-08-03 MED ORDER — BENZONATATE 100 MG PO CAPS: 100.0000 mg | ORAL_CAPSULE | Freq: Three times a day (TID) | ORAL | 0 refills | Status: DC | PRN

## 2020-08-03 MED ORDER — PREDNISONE 10 MG (21) PO TBPK: ORAL_TABLET | Freq: Every day | ORAL | 0 refills | Status: DC

## 2020-08-03 MED ORDER — DEXAMETHASONE SODIUM PHOSPHATE 10 MG/ML IJ SOLN
10.0000 mg | Freq: Once | INTRAMUSCULAR | Status: AC
  Administered 2020-08-03: 09:00:00 10 mg via INTRAMUSCULAR

## 2020-08-03 MED ORDER — DOXYCYCLINE HYCLATE 100 MG PO CAPS: 100.0000 mg | ORAL_CAPSULE | Freq: Two times a day (BID) | ORAL | 0 refills | Status: DC

## 2020-08-03 MED ORDER — PREDNISONE 10 MG (21) PO TBPK
ORAL_TABLET | Freq: Every day | ORAL | 0 refills | Status: DC
Start: 2020-08-03 — End: 2020-09-05

## 2020-08-03 MED ORDER — BENZONATATE 100 MG PO CAPS
100.0000 mg | ORAL_CAPSULE | Freq: Three times a day (TID) | ORAL | 0 refills | Status: DC | PRN
Start: 2020-08-03 — End: 2020-08-03

## 2020-08-03 MED ORDER — DOXYCYCLINE HYCLATE 100 MG PO CAPS
100.0000 mg | ORAL_CAPSULE | Freq: Two times a day (BID) | ORAL | 0 refills | Status: DC
Start: 2020-08-03 — End: 2020-08-03

## 2020-08-03 NOTE — ED Provider Notes (Addendum)
[facility]   [id number] 08/03/20 Arrival Time: 0809   CC: URI, Facial Pain  SUBJECTIVE: History from: patient.  Ronald Cordova is a 54 y.o. male who who presented to the urgent care with a complaint of chills, fever, fatigue and sore throat, sinus pressure and sinus pain with green nasal discharg that started for the past 6 days.  Denies sick exposure to COVID, flu or strep.  Denies recent travel.  Has tried OTC medication without relief.  Denies of aggravating factors.  Denies  previous symptoms in the past.   Denies , rhinorrhea,  SOB, wheezing, chest pain, nausea, changes in bowel or bladder habits.    ROS: As per HPI.  All other pertinent ROS negative.      Past Medical History:  Diagnosis Date  . Anxiety state, unspecified   . Eosinophilic esophagitis 5208   by EGD with  biopsies  . GERD (gastroesophageal reflux disease)    carafate added by Dr. Collene Mares  . Hemorrhoids   . Hypertension   . Irritable bowel syndrome   . Polyp of colon, hyperplastic 01/08/2011   Juanita Craver, repeat due 2018  . Treadmill stress test negative for angina pectoris 2003   Past Surgical History:  Procedure Laterality Date  . APPENDECTOMY     done during high school  . carpal tunnel release  Sept 2008   R hand, by  [place], [place] ARTHROSCOPY  2000   left  . NASAL SINUS SURGERY  1998   bilateral   Allergies  Allergen Reactions  . Penicillins Hives    Has patient had a PCN reaction causing immediate rash, facial/tongue/throat swelling, SOB or lightheadedness with hypotension: No Has patient had a PCN reaction causing severe rash involving mucus membranes or skin necrosis: No Has patient had a PCN reaction that required hospitalization: No Has patient had a PCN reaction occurring within the last 10 years: No If all of the above answers are "NO", then may proceed with Cephalosporin use.    No current facility-administered medications on file prior to  encounter.   Current Outpatient Medications on File Prior to Encounter  Medication Sig Dispense Refill  . clonazePAM (KLONOPIN) 1 MG tablet Take 1 tablet (1 mg total) by mouth daily. 30 tablet 5  . DEXILANT 60 MG capsule TAKE 1 CAPSULE DAILY 90 capsule 1  . doxazosin (CARDURA) 2 MG tablet Take 1 tablet (2 mg total) by mouth daily. 90 tablet 1  . ezetimibe (ZETIA) 10 MG tablet TAKE 1 TABLET DAILY 90 tablet 2  . fluticasone (FLONASE) 50 MCG/ACT nasal spray Place 1 spray into both nostrils daily. 16 g 1  . losartan-hydrochlorothiazide (HYZAAR) 100-12.5 MG tablet TAKE 1 TABLET DAILY 90 tablet 1  . meloxicam (MOBIC) 7.5 MG tablet Take 1 tablet (7.5 mg total) by mouth daily. 90 tablet 1  . methocarbamol (ROBAXIN) 750 MG tablet Take 750 mg by mouth every 8 (eight) hours.    . montelukast (SINGULAIR) 10 MG tablet Take 10 mg by mouth at bedtime. (Patient not taking: Reported on 06/23/2020)    . nystatin cream (MYCOSTATIN) Apply to affected area 2 times daily 30 g 0  . ondansetron (ZOFRAN) 4 MG tablet Take 1 tablet (4 mg total) by mouth every 6 (six) hours. 12 tablet 0  . RETIN-A 0.01 % gel     . rosuvastatin (CRESTOR) 5 MG tablet TAKE 1 TABLET DAILY 90 tablet 1  . sildenafil (REVATIO)  20 MG tablet TAKE 3 TO 5 TABLETS BY MOUTH DAILY AS NEEDED 90 tablet 0  . tiZANidine (ZANAFLEX) 4 MG tablet Take 1 tablet (4 mg total) by mouth every 8 (eight) hours as needed for muscle spasms. 60 tablet 5  . traMADol (ULTRAM) 50 MG tablet Take 1 tablet (50 mg total) by mouth 2 (two) times daily. 60 tablet 5  . triamcinolone ointment (KENALOG) 0.1 % Apply 1 application topically 2 (two) times daily.     Social History   Socioeconomic History  . Marital status: Single    Spouse name: Not on file  . Number of children: Not on file  . Years of education: Not on file  . Highest education level: Not on file  Occupational History  . Not on file  Tobacco Use  . Smoking status: Former Smoker    Packs/day: 1.00     Years: 15.00    Pack years: 15.00    Types: Cigarettes    Quit date: 05/17/2011    Years since quitting: 9.2  . Smokeless tobacco: Never Used  Vaping Use  . Vaping Use: Never used  Substance and Sexual Activity  . Alcohol use: Yes    Comment: occasional  . Drug use: No  . Sexual activity: Yes    Partners: Male  Other Topics Concern  . Not on file  Social History Narrative  . Not on file   Social Determinants of Health   Financial Resource Strain: Not on file  Food Insecurity: Not on file  Transportation Needs: Not on file  Physical Activity: Not on file  Stress: Not on file  Social Connections: Not on file  Intimate Partner Violence: Not on file   Family History  Problem Relation Age of Onset  . Hyperlipidemia Sister   . Hyperlipidemia Brother   . Heart attack Brother 2  . Hyperlipidemia Mother   . Hypertension Mother   . Hyperlipidemia Father   . Hypertension Father   . BRCA 1/2 Maternal Aunt     OBJECTIVE:  Vitals:   08/03/20 0838  BP: 121/82  Pulse: 96  Resp: 18  Temp: 98.5 F (36.9 C)  TempSrc: Oral  SpO2: 95%     General appearance: alert; appears fatigued, but nontoxic; speaking in full sentences and tolerating own secretions HEENT: NCAT; Ears: EACs clear, TMs pearly gray; Eyes: PERRL.  EOM grossly intact. Sinuses: Maxillary sinus tender; Nose: nares patent without rhinorrhea, Throat: oropharynx clear, tonsils non erythematous or enlarged, uvula midline  Neck: supple without LAD Lungs: unlabored respirations, symmetrical air entry; cough: moderate; no respiratory distress; CTAB Heart: regular rate and rhythm.  Radial pulses 2+ symmetrical bilaterally Skin: warm and dry Psychological: alert and cooperative; normal mood and affect  LABS:  Results for orders placed or performed during the hospital encounter of 08/03/20 (from the past 24 hour(s))  POCT rapid strep A     Status: None   Collection Time: 08/03/20  8:49 AM  Result Value Ref Range    Rapid Strep A Screen Negative Negative     ASSESSMENT & PLAN:  1. Cough   2. Acute recurrent maxillary sinusitis   3. Chills with fever   4. Sore throat     Meds ordered this encounter  Medications  . benzonatate (TESSALON) 100 MG capsule    Sig: Take 1 capsule (100 mg total) by mouth 3 (three) times daily as needed for cough.    Dispense:  30 capsule    Refill:  0  .  predniSONE (STERAPRED UNI-PAK 21 TAB) 10 MG (21) TBPK tablet    Sig: Take by mouth daily. Take 6 tabs by mouth daily  for 1 days, then 5 tabs for 1 days, then 4 tabs for 1 days, then 3 tabs for 1 days, 2 tabs for 1 days, then 1 tab by mouth daily for 1 days    Dispense:  21 tablet    Refill:  0  . doxycycline (VIBRAMYCIN) 100 MG capsule    Sig: Take 1 capsule (100 mg total) by mouth 2 (two) times daily.    Dispense:  20 capsule    Refill:  0  . dexamethasone (DECADRON) injection 10 mg    Discharge instructions  Strep test is negative.  Sample will be sent for culture and someone will call if your result is abnormal.  COVID-19, flu A/B testing ordered.  It will take between 2-7 days for test results.  Someone will contact you regarding abnormal results.    In the meantime: You should remain isolated in your home for 10 days from symptom onset AND greater than 24 hours after symptoms resolution (absence of fever without the use of fever-reducing medication and improvement in respiratory symptoms), whichever is longer Get plenty of rest and push fluids Tessalon Perles prescribed for cough Decadron IM was given in office  Prednisone was prescribed Doxycycline was prescribed/take as directed Use OTC throat lozenges such as Halls, Vicks or Cepacol to sooth throat Use medications daily for symptom relief Use OTC medications like ibuprofen or tylenol as needed fever or pain Call or go to the ED if you have any new or worsening symptoms such as fever, worsening cough, shortness of breath, chest tightness, chest pain,  turning blue, changes in mental status, etc...   Reviewed expectations re: course of current medical issues. Questions answered. Outlined signs and symptoms indicating need for more acute intervention. Patient verbalized understanding. After Visit Summary given.         Emerson Monte, FNP 08/03/20 0909    Emerson Monte, FNP 08/03/20 [phone]

## 2020-08-03 NOTE — Discharge Instructions (Addendum)
Strep test is negative.  Sample will be sent for culture and someone will call if your result is abnormal.  COVID-19, flu A/B testing ordered.  It will take between 2-7 days for test results.  Someone will contact you regarding abnormal results.    In the meantime: You should remain isolated in your home for 10 days from symptom onset AND greater than 24 hours after symptoms resolution (absence of fever without the use of fever-reducing medication and improvement in respiratory symptoms), whichever is longer Get plenty of rest and push fluids Tessalon Perles prescribed for cough Decadron IM was given in office  Prednisone was prescribed Doxycycline was prescribed/take as directed Use OTC throat lozenges such as Halls, Vicks or Cepacol to sooth throat Use medications daily for symptom relief Use OTC medications like ibuprofen or tylenol as needed fever or pain Call or go to the ED if you have any new or worsening symptoms such as fever, worsening cough, shortness of breath, chest tightness, chest pain, turning blue, changes in mental status, etc..Marland Kitchen

## 2020-08-03 NOTE — ED Triage Notes (Addendum)
Sore throat, fatigue, chills and fatigue since Tuesday

## 2020-08-04 LAB — COVID-19, FLU A+B NAA
Influenza A, NAA: NOT DETECTED
Influenza B, NAA: NOT DETECTED
SARS-CoV-2, NAA: DETECTED — AB

## 2020-08-06 LAB — CULTURE, GROUP A STREP (THRC)

## 2020-08-14 ENCOUNTER — Other Ambulatory Visit: Payer: Self-pay

## 2020-08-14 ENCOUNTER — Telehealth: Payer: Self-pay | Admitting: Internal Medicine

## 2020-08-14 MED ORDER — CLONAZEPAM 1 MG PO TABS: 1.0000 mg | ORAL_TABLET | Freq: Two times a day (BID) | ORAL | 0 refills | Status: DC | PRN

## 2020-08-14 MED ORDER — ALBUTEROL SULFATE HFA 108 (90 BASE) MCG/ACT IN AERS: 2.0000 | INHALATION_SPRAY | Freq: Four times a day (QID) | RESPIRATORY_TRACT | 11 refills | Status: DC | PRN

## 2020-08-14 MED ORDER — CARVEDILOL 3.125 MG PO TABS: 3.1250 mg | ORAL_TABLET | Freq: Two times a day (BID) | ORAL | 0 refills | Status: DC

## 2020-08-14 NOTE — Telephone Encounter (Signed)
Albuterol will make his heart race but if he feels that he needs it I have sent it for "prn" use    He can follow up with me for blood pressure, and I have sent an rx for [facility]ilol to [facility] .  This can be taken twice daily if his  BP when calm remains  > 140/90   I have increased his rx for clonazepam to twice daily for 30 days   all 3 rx's to [facility]

## 2020-08-14 NOTE — Telephone Encounter (Signed)
I spoke with patient to triage. He stated that over the last two weeks since being dx with Covid he has had a lot of anxiety. He is a high anxiety person as is & when his HR is elevated he is usually stressed. He stresses even more over this bc of his brother's sudden death of a heart attack. He said that that is what had prompted him to take BP last night & it was 150/85. He said that he had chest pain as well , but was again anxious at the time. This morning he checked & BP had come down to 125/78. He said that his second mom who lived close to him also passed away this week & due to him having Covid made things worse further upsetting him.  Advised that if he has reoccurring chest pain & HR continues to elevated especially when he feels he is calm he needs to be evaluated by ED ASAP. Pt verbalized understanding of this. He just isn't completely sure if this is anxiety or if he needs to be concerned about his heart. He wanted to know if he should possibly see Dr. Mariah Milling again since he thought that he has wanted his BP closer to the 115/70 range.   He is also wheezing some he feels due to Covid but O2 is 96%. He wondered if he could possibly get an albuterol inhaler for wheezing? I told patient I would send to you for your thoughts on this?

## 2020-08-14 NOTE — Telephone Encounter (Signed)
Pt tested positive for covid on 08/03/20 Heart rate has been fluctuating between 128-96 over the last 4 weeks  Pt blood pressure is running 145/85  Pt is consider because his brother died 5 years ago suddenly

## 2020-08-14 NOTE — Telephone Encounter (Signed)
Patient called & informed of below. I also explained to patient how to take the carvedilol. Pt verbalized understanding & is also scheduled for f/u.

## 2020-08-29 ENCOUNTER — Other Ambulatory Visit: Payer: Self-pay | Admitting: Internal Medicine

## 2020-08-29 NOTE — Telephone Encounter (Signed)
RX Refill:tramadol Last Seen:06-23-20 Last ordered:02-16-20

## 2020-09-05 ENCOUNTER — Telehealth: Payer: Self-pay

## 2020-09-05 ENCOUNTER — Telehealth (INDEPENDENT_AMBULATORY_CARE_PROVIDER_SITE_OTHER): Payer: BC Managed Care – PPO | Admitting: Internal Medicine

## 2020-09-05 ENCOUNTER — Encounter: Payer: Self-pay | Admitting: Internal Medicine

## 2020-09-05 ENCOUNTER — Other Ambulatory Visit: Payer: Self-pay | Admitting: Internal Medicine

## 2020-09-05 DIAGNOSIS — I10 Essential (primary) hypertension: Secondary | ICD-10-CM

## 2020-09-05 DIAGNOSIS — G8929 Other chronic pain: Secondary | ICD-10-CM

## 2020-09-05 DIAGNOSIS — I700 Atherosclerosis of aorta: Secondary | ICD-10-CM

## 2020-09-05 DIAGNOSIS — E782 Mixed hyperlipidemia: Secondary | ICD-10-CM | POA: Diagnosis not present

## 2020-09-05 DIAGNOSIS — F330 Major depressive disorder, recurrent, mild: Secondary | ICD-10-CM

## 2020-09-05 DIAGNOSIS — M545 Low back pain, unspecified: Secondary | ICD-10-CM

## 2020-09-05 MED ORDER — BUPROPION HCL ER (XL) 150 MG PO TB24: 150.0000 mg | ORAL_TABLET | Freq: Every day | ORAL | 1 refills | Status: DC

## 2020-09-05 MED ORDER — AMLODIPINE BESYLATE 2.5 MG PO TABS
2.5000 mg | ORAL_TABLET | Freq: Every day | ORAL | 1 refills | Status: DC
Start: 2020-09-05 — End: 2021-03-16

## 2020-09-05 NOTE — Progress Notes (Signed)
Virtual Visit via [place]  This visit type was conducted due to national recommendations for restrictions regarding the COVID-19 pandemic (e.g. social distancing).  This format is felt to be most appropriate for this patient at this time.  All issues noted in this document were discussed and addressed.  No physical exam was performed (except for noted visual exam findings with Video Visits).   I connected with@ on 09/07/20 at  8:00 AM EST by a video enabled telemedicine application and verified that I am speaking with the correct person using two identifiers. Location patient: home Location provider: work or home office Persons participating in the virtual visit: patient, provider  I discussed the limitations, risks, security and privacy concerns of performing an evaluation and management service by telephone and the availability of in person appointments. I also discussed with the patient that there may be a patient responsible charge related to this service. The patient expressed understanding and agreed to proceed.  Reason for visit: follow up on COVID infection,  Fatigue and anxiety   HPI:  55 yr old male infected with COVID 56 in mid December, presents for follow up on multiple issues    GAD:  Aggravated by mother's dementia and her recent diagnosis of breast cancer at the age of 46. He  has been Using clonazepam once daily  Seeing a therapist.  Feeling depressed,  Has had some weight gain.  Discussed trial of wellbutrin  2) HTN : readings  with carvedilol are never below 140/90  Doesn't like prn use of carvedilol.  Makes him feel bad. Discussed adding amlodipine 2.5 to current regimen of losartan/hct and cardura   3) FMLA:  He is requesting time off to allow for Mom's cancer care .  anticpates a need for  4 days/month  Max.  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 Due to his back pain aggravated by increased work  hours.   He continues to use celebrex daily and prn tramadol   5) Post COVID fatigue and atypical chest pain :  Discussed   ROS: See pertinent positives and negatives per HPI.  Past Medical History:  Diagnosis Date  . Anxiety state, unspecified   . Eosinophilic esophagitis 4650   by EGD with  biopsies  . GERD (gastroesophageal reflux disease)    carafate added by Dr. Collene Mares  . Hemorrhoids   . Hypertension   . Irritable bowel syndrome   . Polyp of colon, hyperplastic 01/08/2011   Juanita Craver, repeat due 2018  . Treadmill stress test negative for angina pectoris 2003    Past Surgical History:  Procedure Laterality Date  . APPENDECTOMY     done during high school  . carpal tunnel release  Sept 2008   R hand, by  [place], [place] ARTHROSCOPY  2000   left  . NASAL SINUS SURGERY  1998   bilateral    Family History  Problem Relation Age of Onset  . Hyperlipidemia Sister   . Hyperlipidemia Brother   . Heart attack Brother 42  . Hyperlipidemia Mother   . Hypertension Mother   . Hyperlipidemia Father   . Hypertension Father   . BRCA 1/2 Maternal Aunt     SOCIAL HX:  reports that he quit smoking about 9 years ago. His smoking use included cigarettes. He has a 15.00 pack-year smoking history. He has never used smokeless  tobacco. He reports current alcohol use. He reports that he does not use drugs.   Current Outpatient Medications:  .  albuterol (VENTOLIN HFA) 108 (90 Base) MCG/ACT inhaler, Inhale 2 puffs into the lungs every 6 (six) hours as needed for wheezing or shortness of breath., Disp: 3.7 g, Rfl: 11 .  amLODipine (NORVASC) 2.5 MG tablet, Take 1 tablet (2.5 mg total) by mouth daily., Disp: 90 tablet, Rfl: 1 .  buPROPion (WELLBUTRIN XL) 150 MG 24 hr tablet, Take 1 tablet (150 mg total) by mouth daily., Disp: 90 tablet, Rfl: 1 .  DEXILANT 60 MG capsule, TAKE 1 CAPSULE DAILY, Disp: 90 capsule, Rfl: 1 .  doxazosin (CARDURA) 2 MG tablet, Take 1  tablet (2 mg total) by mouth daily., Disp: 90 tablet, Rfl: 1 .  ezetimibe (ZETIA) 10 MG tablet, TAKE 1 TABLET DAILY, Disp: 90 tablet, Rfl: 2 .  fluticasone (FLONASE) 50 MCG/ACT nasal spray, Place 1 spray into both nostrils daily., Disp: 16 g, Rfl: 1 .  losartan-hydrochlorothiazide (HYZAAR) 100-12.5 MG tablet, TAKE 1 TABLET DAILY, Disp: 90 tablet, Rfl: 1 .  meloxicam (MOBIC) 7.5 MG tablet, Take 1 tablet (7.5 mg total) by mouth daily., Disp: 90 tablet, Rfl: 1 .  methocarbamol (ROBAXIN) 750 MG tablet, Take 750 mg by mouth every 8 (eight) hours., Disp: , Rfl:  .  montelukast (SINGULAIR) 10 MG tablet, Take 10 mg by mouth at bedtime., Disp: , Rfl:  .  nystatin cream (MYCOSTATIN), Apply to affected area 2 times daily, Disp: 30 g, Rfl: 0 .  ondansetron (ZOFRAN) 4 MG tablet, Take 1 tablet (4 mg total) by mouth every 6 (six) hours., Disp: 12 tablet, Rfl: 0 .  RETIN-A 0.01 % gel, , Disp: , Rfl:  .  rosuvastatin (CRESTOR) 5 MG tablet, TAKE 1 TABLET DAILY, Disp: 90 tablet, Rfl: 1 .  sildenafil (REVATIO) 20 MG tablet, TAKE 3 TO 5 TABLETS BY MOUTH DAILY AS NEEDED, Disp: 90 tablet, Rfl: 0 .  tiZANidine (ZANAFLEX) 4 MG tablet, Take 1 tablet (4 mg total) by mouth every 8 (eight) hours as needed for muscle spasms., Disp: 60 tablet, Rfl: 5 .  traMADol (ULTRAM) 50 MG tablet, TAKE 1 TABLET TWICE A DAY, Disp: 60 tablet, Rfl: 5 .  triamcinolone ointment (KENALOG) 0.1 %, Apply 1 application topically 2 (two) times daily., Disp: , Rfl:  .  cephALEXin (KEFLEX) 500 MG capsule, Take 1 capsule (500 mg total) by mouth 2 (two) times daily for 7 days., Disp: 14 capsule, Rfl: 0 .  [START ON 09/13/2020] clonazePAM (KLONOPIN) 1 MG tablet, Take 1 tablet (1 mg total) by mouth daily. As needed for anxiety, Disp: 30 tablet, Rfl: 5  EXAM:  VITALS per patient if applicable:  GENERAL: alert, oriented, appears well and in no acute distress  HEENT: atraumatic, conjunttiva clear, no obvious abnormalities on inspection of external nose and  ears  NECK: normal movements of the head and neck  LUNGS: on inspection no signs of respiratory distress, breathing rate appears normal, no obvious gross SOB, gasping or wheezing  CV: no obvious cyanosis  MS: moves all visible extremities without noticeable abnormality  PSYCH/NEURO: pleasant and cooperative, no obvious depression or anxiety, speech and thought processing grossly intact  ASSESSMENT AND PLAN:  Discussed the following assessment and plan:  Major depressive disorder, recurrent episode, 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HCC)  Essential hypertension  Mixed hyperlipidemia  Aortic atherosclerosis (HCC)  Chronic right-sided low back pain without sciatica  Major depressive disorder, recurrent episode, mild with anxious  distress (HCC) Recommend trial of wellbutrin given management of anxiety with clonazepam  And current negative symptoms   Essential hypertension He has not tolerated carvedilol.  Will add amlodipine 2.5 mg daily,  continue doxazosin 2 mg daily  And losartan/hct  100/12.5 mg daily   Mixed hyperlipidemia Managed with rosuvastatin 5 mg daily and zetia 10 mg daily   Lab Results  Component Value Date   CHOL 132 07/21/2020   HDL 43.20 07/21/2020   LDLCALC 68 07/21/2020   LDLDIRECT 146.0 07/28/2015   TRIG 105.0 07/21/2020   CHOLHDL 3 07/21/2020     Aortic atherosclerosis (HCC) Reviewed findings of prior CT scan today..  Patient is tolerating low dose of crestor,  Which was added to zetia   Chronic right-sided low back pain without sciatica He is using scheduled celebrex and prn Tramadol for moderate pain , maximum 2 daily. 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    Refill history confirmed via [place] Controlled Substance databas, accessed by me today.    I discussed the assessment and treatment plan with the patient. The patient was provided an opportunity to ask questions and all were  answered. The patient agreed with the plan and demonstrated an understanding of the instructions.   The patient was advised to call back or seek an in-person evaluation if the symptoms worsen or if the condition fails to improve as anticipated.   I spent 30 mintutes dedicated to the care of this patient on the date of this encounter to include pre-visit review of his medical history,  Face-to-face time with the patient , and post visit ordering of testing and therapeutics.   Crecencio Mc, MD

## 2020-09-05 NOTE — Patient Instructions (Signed)
Changes today:  1) Adding wellbutrin XL 150 mg once daily in the am  2) no more carvedilol.  Adding amlodipine 2.5 mg  Daily EVERY DAY.  Continue losartan hctz and cardura.  Dc amlodipine if readings are < 110/60  3) FMLA weill be completed  y end of next week for 4 days/month.    4) follow up on e month for medicatio nchanges

## 2020-09-05 NOTE — Telephone Encounter (Signed)
Pt would like to know what time of the day he should take amLODipine (NORVASC) 2.5 MG tablet. He states that you could see him the message through MyChart.

## 2020-09-07 ENCOUNTER — Encounter: Payer: Self-pay | Admitting: Emergency Medicine

## 2020-09-07 ENCOUNTER — Ambulatory Visit
Admission: EM | Admit: 2020-09-07 | Discharge: 2020-09-07 | Disposition: A | Discharge status: Home / Self Care | Payer: BC Managed Care – PPO | Attending: Family Medicine | Admitting: Family Medicine

## 2020-09-07 DIAGNOSIS — K122 Cellulitis and abscess of mouth: Secondary | ICD-10-CM | POA: Insufficient documentation

## 2020-09-07 DIAGNOSIS — J039 Acute tonsillitis, unspecified: Secondary | ICD-10-CM | POA: Diagnosis not present

## 2020-09-07 DIAGNOSIS — Z113 Encounter for screening for infections with a predominantly sexual mode of transmission: Secondary | ICD-10-CM | POA: Diagnosis present

## 2020-09-07 DIAGNOSIS — R519 Headache, unspecified: Secondary | ICD-10-CM | POA: Diagnosis not present

## 2020-09-07 LAB — POCT RAPID STREP A (OFFICE): Rapid Strep A Screen: NEGATIVE

## 2020-09-07 MED ORDER — CLONAZEPAM 1 MG PO TABS: 1.0000 mg | ORAL_TABLET | Freq: Every day | ORAL | 5 refills | Status: DC

## 2020-09-07 MED ORDER — CEPHALEXIN 500 MG PO CAPS: 500.0000 mg | ORAL_CAPSULE | Freq: Two times a day (BID) | ORAL | 0 refills | Status: AC

## 2020-09-07 MED ORDER — DEXAMETHASONE SODIUM PHOSPHATE 10 MG/ML IJ SOLN
10.0000 mg | Freq: Once | INTRAMUSCULAR | Status: AC
  Administered 2020-09-07: 10 mg via INTRAMUSCULAR

## 2020-09-07 NOTE — Assessment & Plan Note (Signed)
Recommend trial of wellbutrin given management of anxiety with clonazepam  And current negative symptoms

## 2020-09-07 NOTE — Assessment & Plan Note (Addendum)
He has not tolerated carvedilol.  Will add amlodipine 2.5 mg daily,  continue doxazosin 2 mg daily  And losartan/hct  100/12.5 mg daily

## 2020-09-07 NOTE — Assessment & Plan Note (Signed)
Reviewed findings of prior CT scan today..  Patient is tolerating low dose of crestor,  Which was added to zetia

## 2020-09-07 NOTE — Assessment & Plan Note (Signed)
He is using scheduled celebrex and prn Tramadol for moderate pain , maximum 2 daily. 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    Refill history confirmed via  Controlled Substance databas, accessed by me today.

## 2020-09-07 NOTE — Assessment & Plan Note (Addendum)
Managed with rosuvastatin 5 mg daily and zetia 10 mg daily   Lab Results  Component Value Date   CHOL 132 07/21/2020   HDL 43.20 07/21/2020   LDLCALC 68 07/21/2020   LDLDIRECT 146.0 07/28/2015   TRIG 105.0 07/21/2020   CHOLHDL 3 07/21/2020

## 2020-09-07 NOTE — Discharge Instructions (Addendum)
Swab testing will be back in about 2 days. We will inform you of abnormal results and will treat accordingly  Your rapid strep test is negative.  A throat culture is pending; we will call you if it is positive requiring treatment.    You have received a steroid injection in the office to help with uvula and tonsillar swelling  I have sent in Keflex for you to take twice a day for 7 days  Your COVID test is pending. You should self quarantine until the test result is back.    Take Tylenol or ibuprofen as needed for fever or discomfort. Rest and keep yourself hydrated.    Follow-up with your primary care provider if your symptoms are not improving.

## 2020-09-07 NOTE — ED Triage Notes (Signed)
Nasal congestion, ear congestion , sore throat, roof of mouth sore hx of covid in December

## 2020-09-09 LAB — CYTOLOGY, (ORAL, ANAL, URETHRAL) ANCILLARY ONLY
Chlamydia: NEGATIVE
Comment: NEGATIVE
Comment: NEGATIVE
Comment: NORMAL
Neisseria Gonorrhea: NEGATIVE
Trichomonas: NEGATIVE

## 2020-09-09 LAB — COVID-19, FLU A+B NAA
Influenza A, NAA: NOT DETECTED
Influenza B, NAA: NOT DETECTED
SARS-CoV-2, NAA: NOT DETECTED

## 2020-09-10 LAB — CULTURE, GROUP A STREP (THRC)

## 2020-09-10 NOTE — ED Provider Notes (Addendum)
[facility]    CSN: [id number] Arrival date & time: 09/07/20  1428      History   Chief Complaint No chief complaint on file.   HPI Ronald Cordova is a 55 y.o. male.   Reports that he has been having nasal congestion, sore throat, the roof of his mouth is sore, headache, fatigue for the last 2 to 3 days.  Has taken Tylenol and ibuprofen with no relief.  Reports that he had Covid in December 2021.  Is concerned that he may also have the flu.  Also requesting oral gonorrhea and Chlamydia testing today.  Denies fever, rash, nausea, vomiting, diarrhea, other symptoms  ROS per HPI  The history is provided by the patient.    Past Medical History:  Diagnosis Date  . Anxiety state, unspecified   . Eosinophilic esophagitis 7253   by EGD with  biopsies  . GERD (gastroesophageal reflux disease)    carafate added by Dr. Collene Mares  . Hemorrhoids   . Hypertension   . Irritable bowel syndrome   . Polyp of colon, hyperplastic 01/08/2011   Juanita Craver, repeat due 2018  . Treadmill stress test negative for angina pectoris 2003    Patient Active Problem List   Diagnosis Date Noted  . Edema 12/17/2019  . Colon cancer screening 08/29/2018  . B12 deficiency 08/29/2018  . Allergic rhinitis 04/25/2018  . Aortic atherosclerosis ([place]) 11/21/2017  . Chronic right-sided low back pain without sciatica 09/08/2017  . Fatty liver 03/15/2017  . Vitamin D deficiency 12/23/2016  . Restless legs 12/21/2016  . Ulnar neuropathy at elbow of left upper extremity 06/29/2016  . Family history of sudden death in brother Dec 19, 2015  . Polyarthritis of multiple sites 12-19-2015  . Chronic bilateral thoracic back pain 07/29/2015  . Palpitations 07/29/2015  . Essential hypertension 11/13/2014  . Major depressive disorder, recurrent episode, 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[place]) 11/03/2014  . Obesity (BMI 30.0-34.9) 08/28/2012  . Incidental pulmonary nodule, > 11m and < 838m07/03/2012  . 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 Isolated or specific phobia 01/21/2012  . Chest pain with moderate risk for cardiac etiology 01/21/2012  . Mixed hyperlipidemia 05/09/2011  . Anxiety state   . Irritable bowel syndrome   . Hemorrhoids     Past Surgical History:  Procedure Laterality Date  . APPENDECTOMY     done during high school  . carpal tunnel release  Sept 2008   [place]   [place]  . NASAL SINUS SURGERY  1998   bilateral       Home Medications    Prior to Admission medications   Medication Sig Start Date End Date Taking? Authorizing Provider  cephALEXin (KEFLEX) 500 MG capsule Take 1 capsule (500 mg total) by mouth 2 (two) times daily for 7 days. 09/07/20 09/14/20 Yes MaFaustino CongressNP  albuterol (VENTOLIN HFA) 108 (90 Base) MCG/ACT inhaler Inhale 2 puffs into the lungs every 6 (six) hours as needed for wheezing or shortness of breath. 08/14/20   TuCrecencio McMD  amLODipine (NORVASC) 2.5 MG tablet Take 1 tablet (2.5 mg total) by mouth daily. 09/05/20   TuCrecencio McMD  buPROPion (WELLBUTRIN XL) 150 MG 24 hr tablet Take 1 tablet (150 mg total) by mouth daily. 09/05/20   TuCrecencio McMD  clonazePAM (KLONOPIN) 1 MG tablet Take 1 tablet (1 mg total)  by mouth daily. As needed for anxiety 09/13/20   Crecencio Mc, MD  DEXILANT 60 MG capsule TAKE 1 CAPSULE DAILY 07/17/20   Crecencio Mc, MD  doxazosin (CARDURA) 2 MG tablet Take 1 tablet (2 mg total) by mouth daily. 07/25/20   Crecencio Mc, MD  ezetimibe (ZETIA) 10 MG tablet TAKE 1 TABLET DAILY 07/14/20   Crecencio Mc, MD  fluticasone (FLONASE) 50 MCG/ACT nasal spray Place 1 spray into both nostrils daily. 01/03/20   Crecencio Mc, MD  losartan-hydrochlorothiazide [facility]) 100-12.5 MG tablet TAKE 1 TABLET DAILY 07/17/20   Crecencio Mc, MD  meloxicam (MOBIC) 7.5 MG tablet Take 1 tablet (7.5 mg total) by mouth daily. 07/16/20   Crecencio Mc, MD   methocarbamol (ROBAXIN) 750 MG tablet Take 750 mg by mouth every 8 (eight) hours. 04/30/20   [provider]  montelukast (SINGULAIR) 10 MG tablet Take 10 mg by mouth at bedtime.    [provider]  nystatin cream (MYCOSTATIN) Apply to affected area 2 times daily 03/06/20   Sharion Balloon, NP  ondansetron (ZOFRAN) 4 MG tablet Take 1 tablet (4 mg total) by mouth every 6 (six) hours. 06/19/20   Faustino Congress, NP  RETIN-A 0.01 % gel  06/25/19   [provider]  rosuvastatin (CRESTOR) 5 MG tablet TAKE 1 TABLET DAILY 07/17/20   Crecencio Mc, MD  sildenafil (REVATIO) 20 MG tablet TAKE 3 TO 5 TABLETS BY MOUTH DAILY AS NEEDED 08/13/19   Crecencio Mc, MD  tiZANidine (ZANAFLEX) 4 MG tablet Take 1 tablet (4 mg total) by mouth every 8 (eight) hours as needed for muscle spasms. 06/23/20   Crecencio Mc, MD  traMADol (ULTRAM) 50 MG tablet TAKE 1 TABLET TWICE A DAY 08/30/20   Crecencio Mc, MD  triamcinolone ointment (KENALOG) 0.1 % Apply 1 application topically 2 (two) times daily. 05/06/20   [provider]    Family History Family History  Problem Relation Age of Onset  . Hyperlipidemia Sister   . Hyperlipidemia Brother   . Heart attack Brother 51  . Hyperlipidemia Mother   . Hypertension Mother   . Hyperlipidemia Father   . Hypertension Father   . BRCA 1/2 Maternal Aunt     Social History Social History   Tobacco Use  . Smoking status: Former Smoker    Packs/day: 1.00    Years: 15.00    Pack years: 15.00    Types: Cigarettes    Quit date: 05/17/2011    Years since quitting: 9.3  . Smokeless tobacco: Never Used  Vaping Use  . Vaping Use: Never used  Substance Use Topics  . Alcohol use: Yes    Comment: occasional  . Drug use: No     Allergies   Penicillins   Review of Systems Review of Systems   Physical Exam Triage Vital Signs ED Triage Vitals  Enc Vitals Group     BP 09/07/20 1440 128/82     Pulse Rate 09/07/20 1440 82      Resp 09/07/20 1440 16     Temp 09/07/20 1440 98.2 F (36.8 C)     Temp Source 09/07/20 1440 Oral     SpO2 09/07/20 1440 95 %     Weight 09/07/20 1446 220 lb (99.8 kg)     Height 09/07/20 1446 '5\' 9"'  (1.753 m)     Head Circumference --      Peak Flow --  Pain Score 09/07/20 1445 2     Pain Loc --      Pain Edu? --      Excl. in [place]? --    No data found.  Updated Vital Signs BP 128/82 (BP Location: Right Arm)   Pulse 82   Temp 98.2 F (36.8 C) (Oral)   Resp 16   Ht '5\' 9"'  (1.753 m)   Wt 220 lb (99.8 kg)   SpO2 95%   BMI 32.49 kg/m   Visual Acuity Right Eye Distance:   Left Eye Distance:   Bilateral Distance:    Right Eye Near:   Left Eye Near:    Bilateral Near:     Physical Exam Vitals and nursing note reviewed.  Constitutional:      General: He is not in acute distress.    Appearance: Normal appearance. He is well-developed and well-nourished. He is ill-appearing.  HENT:     Head: Normocephalic and atraumatic.     Right Ear: Tympanic membrane, ear canal and external ear normal.     Left Ear: Tympanic membrane, ear canal and external ear normal.     Mouth/Throat:     Mouth: Mucous membranes are moist.     Pharynx: Oropharynx is clear. Posterior oropharyngeal erythema present.  Eyes:     Extraocular Movements: Extraocular movements intact.     Conjunctiva/sclera: Conjunctivae normal.     Pupils: Pupils are equal, round, and reactive to light.  Cardiovascular:     Rate and Rhythm: Normal rate and regular rhythm.     Heart sounds: Normal heart sounds. No murmur heard.   Pulmonary:     Effort: Pulmonary effort is normal. No respiratory distress.     Breath sounds: Normal breath sounds. No stridor. No wheezing, rhonchi or rales.  Chest:     Chest wall: No tenderness.  Abdominal:     Palpations: Abdomen is soft.     Tenderness: There is no abdominal tenderness.  Musculoskeletal:        General: No edema. Normal range of motion.     Cervical back: Normal  range of motion and neck supple.  Skin:    General: Skin is warm and dry.     Capillary Refill: Capillary refill takes less than 2 seconds.  Neurological:     General: No focal deficit present.     Mental Status: He is alert and oriented to person, place, and time.  Psychiatric:        Mood and Affect: Mood and affect and mood normal.        Behavior: Behavior normal.      UC Treatments / Results  Labs (all labs ordered are listed, but only abnormal results are displayed) Labs Reviewed  COVID-19, FLU A+B NAA   Narrative:    Test(s) 140142-Influenza A, NAA; 140143-Influenza B, NAA was developed and its performance characteristics determined by [facility]. It has not been cleared or approved by the Food and Drug Administration. Performed at:  [address] [place], [place]  [id number] Lab Director: Rush Farmer MD, Phone:  [phone]  CULTURE, GROUP A STREP [facility])  POCT RAPID STREP A (OFFICE)  CYTOLOGY, (ORAL, ANAL, URETHRAL) ANCILLARY ONLY    EKG   Radiology No results found.  Procedures Procedures (including critical care time)  Medications Ordered in UC Medications  dexamethasone (DECADRON) injection 10 mg (10 mg Intramuscular Given 09/07/20 1605)    Initial Impression / Assessment and Plan / UC Course  I  have reviewed the triage vital signs and the nursing notes.  Pertinent labs & imaging results that were available during my care of the patient were reviewed by me and considered in my medical decision making (see chart for details).  Clinical Course as of 09/10/20 1736  Sun Sep 07, 2020  1559 Cytology (oral, anal, urethral) ancillary only [SM]    Clinical Course User Index [SM] Faustino Congress, NP   Acute tonsillitis Headache Uvulitis STD screen  Decadron 10 mg IM given in office today for tonsillar and uvula swelling Keflex prescribed for acute tonsillitis Rapid strep is negative.  Will culture and follow-up with abnormal  results that need further treatment Oral cytology swab obtained We will follow up with abnormal results that need further treatment Covid and flu testing obtained today Isolate until we have results that are back and negative If results are positive need to isolate for 5 days, and then if symptoms are still present we will need to extend isolation for 10 days Follow-up as needed Follow-up in the ER for trouble swallowing, trouble breathing, other concerning symptoms  Final Clinical Impressions(s) / UC Diagnoses   Final diagnoses:  Acute tonsillitis, unspecified etiology  Nonintractable headache, unspecified chronicity pattern, unspecified headache type  Uvulitis  Screen for STD (sexually transmitted disease)     Discharge Instructions     Swab testing will be back in about 2 days. We will inform you of abnormal results and will treat accordingly  Your rapid strep test is negative.  A throat culture is pending; we will call you if it is positive requiring treatment.    You have received a steroid injection in the office to help with uvula and tonsillar swelling  I have sent in Keflex for you to take twice a day for 7 days  Your COVID test is pending. You should self quarantine until the test result is back.    Take Tylenol or ibuprofen as needed for fever or discomfort. Rest and keep yourself hydrated.    Follow-up with your primary care provider if your symptoms are not improving.        ED Prescriptions    Medication Sig Dispense Auth. Provider   cephALEXin (KEFLEX) 500 MG capsule Take 1 capsule (500 mg total) by mouth 2 (two) times daily for 7 days. 14 capsule Faustino Congress, NP     PDMP not reviewed this encounter.   Faustino Congress, NP 09/10/20 1736    Faustino Congress, NP 09/10/20 1736

## 2020-09-18 ENCOUNTER — Other Ambulatory Visit: Payer: Self-pay | Admitting: Internal Medicine

## 2020-10-03 ENCOUNTER — Other Ambulatory Visit: Payer: Self-pay

## 2020-10-03 ENCOUNTER — Ambulatory Visit: Payer: BC Managed Care – PPO | Admitting: Internal Medicine

## 2020-10-03 ENCOUNTER — Encounter: Payer: Self-pay | Admitting: Internal Medicine

## 2020-10-03 DIAGNOSIS — F330 Major depressive disorder, recurrent, mild: Secondary | ICD-10-CM

## 2020-10-03 DIAGNOSIS — M545 Low back pain, unspecified: Secondary | ICD-10-CM

## 2020-10-03 DIAGNOSIS — K648 Other hemorrhoids: Secondary | ICD-10-CM

## 2020-10-03 DIAGNOSIS — F411 Generalized anxiety disorder: Secondary | ICD-10-CM | POA: Diagnosis not present

## 2020-10-03 DIAGNOSIS — G8929 Other chronic pain: Secondary | ICD-10-CM

## 2020-10-03 MED ORDER — TRAMADOL HCL 50 MG PO TABS: 50.0000 mg | ORAL_TABLET | Freq: Four times a day (QID) | ORAL | 5 refills | Status: DC | PRN

## 2020-10-03 NOTE — Progress Notes (Signed)
Subjective:  Patient ID: Ronald Cordova, male    DOB: 07-10-1966  Age: 55 y.o. MRN: [id number]  CC: Diagnoses of Anxiety state, Major depressive disorder, recurrent episode, 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[place]), Chronic right-sided low back pain without sciatica, and Internal hemorrhoids were pertinent to this visit.  HPI Ronald Cordova presents for follow up  This visit occurred during the SARS-CoV-2 public health emergency.  Safety protocols were in place, including screening questions prior to the visit, additional usage of staff PPE, and extensive cleaning of exam room while observing appropriate contact time as indicated for disinfecting solutions.   1) Internal hemorrhoids started bothering him yesterday.  Not severe.  Aggravated by prolonged driving  2) Increased Anxiety,  Feels Restless at night .  Has been taking  wellbutrin 150 for the past 3 weeks.  Using  Clonazepam at most once daily .  aggravated by his mothers recent diagnosis of  BRCA . Her treatment is not going to involve chemotherapy or radiation but she will be taking oral therapy so he is not going to miss work   3) Requesting consideration for [employer] for anxiety and back pain. He was told that he would need it  if he  Missed  more than 5 days per year.    Outpatient Medications Prior to Visit  Medication Sig Dispense Refill  . albuterol (VENTOLIN HFA) 108 (90 Base) MCG/ACT inhaler Inhale 2 puffs into the lungs every 6 (six) hours as needed for wheezing or shortness of breath. 3.7 g 11  . amLODipine (NORVASC) 2.5 MG tablet Take 1 tablet (2.5 mg total) by mouth daily. 90 tablet 1  . buPROPion (WELLBUTRIN XL) 150 MG 24 hr tablet Take 1 tablet (150 mg total) by mouth daily. 90 tablet 1  . clonazePAM (KLONOPIN) 1 MG tablet Take 1 tablet (1 mg total) by mouth daily. As needed for anxiety 30 tablet 5  . DEXILANT 60 MG capsule TAKE 1 CAPSULE DAILY 90 capsule 1  . doxazosin (CARDURA) 2 MG tablet Take 1 tablet (2 mg total) by mouth  daily. 90 tablet 1  . doxycycline (VIBRAMYCIN) 100 MG capsule Take 100 mg by mouth 2 (two) times daily.    Marland Kitchen ezetimibe (ZETIA) 10 MG tablet TAKE 1 TABLET DAILY 90 tablet 2  . fluticasone (FLONASE) 50 MCG/ACT nasal spray Place 1 spray into both nostrils daily. 16 g 1  . ketoconazole (NIZORAL) 2 % shampoo     . losartan-hydrochlorothiazide (HYZAAR) 100-12.5 MG tablet TAKE 1 TABLET DAILY 90 tablet 1  . methocarbamol (ROBAXIN) 750 MG tablet Take 750 mg by mouth every 8 (eight) hours.    . montelukast (SINGULAIR) 10 MG tablet Take 10 mg by mouth at bedtime.    Marland Kitchen nystatin cream (MYCOSTATIN) Apply to affected area 2 times daily 30 g 0  . ondansetron (ZOFRAN) 4 MG tablet Take 1 tablet (4 mg total) by mouth every 6 (six) hours. 12 tablet 0  . RETIN-A 0.01 % gel     . rosuvastatin (CRESTOR) 5 MG tablet TAKE 1 TABLET DAILY 90 tablet 1  . sildenafil (REVATIO) 20 MG tablet TAKE 3 TO 5 TABLETS BY MOUTH DAILY AS NEEDED 90 tablet 0  . tiZANidine (ZANAFLEX) 4 MG tablet Take 1 tablet (4 mg total) by mouth every 8 (eight) hours as needed for muscle spasms. 60 tablet 5  . triamcinolone ointment (KENALOG) 0.1 % Apply 1 application topically 2 (two) times daily.    . meloxicam (MOBIC) 7.5 MG tablet  Take 1 tablet (7.5 mg total) by mouth daily. 90 tablet 1  . traMADol (ULTRAM) 50 MG tablet TAKE 1 TABLET TWICE A DAY 60 tablet 5   No facility-administered medications prior to visit.    Review of Systems;  Patient denies headache, fevers, malaise, unintentional weight loss, skin rash, eye pain, sinus congestion and sinus pain, sore throat, dysphagia,  hemoptysis , cough, dyspnea, wheezing, chest pain, palpitations, orthopnea, edema, abdominal pain, nausea, melena, diarrhea, constipation, flank pain, dysuria, hematuria, urinary  Frequency, nocturia, numbness, tingling, seizures,  Focal weakness, Loss of consciousness,  Tremor, insomnia, depression, anxiety, and suicidal ideation.      Objective:  BP 112/76 (BP  Location: Left Arm, Patient Position: Sitting, Cuff Size: Normal)   Pulse 80   Temp 97.9 F (36.6 C) (Oral)   Resp 15   Ht '5\' 9"'  (1.753 m)   Wt 213 lb 6.4 oz (96.8 kg)   SpO2 98%   BMI 31.51 kg/m   BP Readings from Last 3 Encounters:  10/03/20 112/76  09/07/20 128/82  09/05/20 130/89    Wt Readings from Last 3 Encounters:  10/03/20 213 lb 6.4 oz (96.8 kg)  09/07/20 220 lb (99.8 kg)  09/05/20 200 lb (90.7 kg)    General appearance: alert, cooperative and appears stated age Ears: normal TM's and external ear canals both ears Throat: lips, mucosa, and tongue normal; teeth and gums normal Neck: no adenopathy, no carotid bruit, supple, symmetrical, trachea midline and thyroid not enlarged, symmetric, no tenderness/mass/nodules Back: symmetric, no curvature. ROM normal. No CVA tenderness. Lungs: clear to auscultation bilaterally Heart: regular rate and rhythm, S1, S2 normal, no murmur, click, rub or gallop Abdomen: soft, non-tender; bowel sounds normal; no masses,  no organomegaly Pulses: 2+ and symmetric Skin: Skin color, texture, turgor normal. No rashes or lesions Lymph nodes: Cervical, supraclavicular, and axillary nodes normal.  Lab Results  Component Value Date   HGBA1C 5.3 12/03/2019    Lab Results  Component Value Date   CREATININE 0.90 07/21/2020   CREATININE 0.89 12/03/2019   CREATININE 0.84 09/24/2019    Lab Results  Component Value Date   WBC 7.3 07/21/2020   HGB 15.2 07/21/2020   HCT 45.5 07/21/2020   PLT 248.0 07/21/2020   GLUCOSE 92 07/21/2020   CHOL 132 07/21/2020   TRIG 105.0 07/21/2020   HDL 43.20 07/21/2020   LDLDIRECT 146.0 07/28/2015   LDLCALC 68 07/21/2020   ALT 30 07/21/2020   AST 17 07/21/2020   NA 141 07/21/2020   K 4.3 07/21/2020   CL 105 07/21/2020   CREATININE 0.90 07/21/2020   BUN 16 07/21/2020   CO2 29 07/21/2020   TSH 0.88 07/21/2020   PSA 0.5 09/24/2019   HGBA1C 5.3 12/03/2019   MICROALBUR <0.7 12/03/2019    No results  found.  Assessment & Plan:   Problem List Items Addressed This Visit      Unprioritized   Anxiety state    Improved with use of essential oils (lavender).  He prefers to continue a reduced use of  clonazepam rx to #30/month . Refill history confirmed via [place] Controlled Substance databas, accessed by me today..      Chronic right-sided low back pain without sciatica    He is using scheduled celebrex and prn Tramadol for moderate pain , maximum 2 daily. 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   Increasing tramadol for use up  to 4 times daily .  Refill history confirmed via [place] Controlled Substance databas, accessed by me today.  I have agreed to authorize FMLA in the future if needed,  But currently there is no clear need or pattern to his episodes      Relevant Medications   traMADol (ULTRAM) 50 MG tablet   Internal hemorrhoids    Reminded that all hemorrhoid treatments are now available OTC. Advised to  Start using a stool softener (docusate 100 mg ) up to 200 mg daily if needed to prevent constipation,   Use of anusol HC or any hemorrhoid  cream that has pramoxine in it for itching and inflammation,  And Nupercainal for pain       Major depressive disorder, recurrent episode, 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[place])    His depressive symptoms have responded to wellbutrin and he is controlling his anxiety with prn clonazepam.  Medications discussed,  Risks and benefits of continued use outlined.  Refills given.          I have discontinued [name] "Scott"'s meloxicam. I have also changed his traMADol. Additionally, I am having him maintain his montelukast, Retin-A, fluticasone, nystatin cream, ondansetron, methocarbamol, triamcinolone ointment, tiZANidine, ezetimibe, Dexilant, rosuvastatin, losartan-hydrochlorothiazide, doxazosin, albuterol, buPROPion, amLODipine, clonazePAM, sildenafil, doxycycline, and  ketoconazole.  Meds ordered this encounter  Medications  . traMADol (ULTRAM) 50 MG tablet    Sig: Take 1 tablet (50 mg total) by mouth every 6 (six) hours as needed.    Dispense:  120 tablet    Refill:  5    Medications Discontinued During This Encounter  Medication Reason  . meloxicam (MOBIC) 7.5 MG tablet   . traMADol (ULTRAM) 50 MG tablet     Follow-up: No follow-ups on file.   Crecencio Mc, MD

## 2020-10-03 NOTE — Patient Instructions (Addendum)
ok to substitute celebrex for meloxicam.  Stop if your blood pressure becomes elevated by it.    Ok to increase the clonazepam to 1/2 tablet in the morning if adjusting to wellbutrin requires it   You can increase the tramadol to 4 times daily if needed for pain  (new rx [facility])   For hemorrhoids:  Nupercainal (numbing)  Proctofoam or Anusol HC (steroid for the itching/inflammation)   Your labs were fine in December.  We will repeat in June

## 2020-10-05 NOTE — Assessment & Plan Note (Addendum)
Reminded that all hemorrhoid treatments are now available OTC. Advised to  Start using a stool softener (docusate 100 mg ) up to 200 mg daily if needed to prevent constipation,   Use of anusol HC or any hemorrhoid  cream that has pramoxine in it for itching and inflammation,  And Nupercainal for pain

## 2020-10-05 NOTE — Assessment & Plan Note (Signed)
He is using scheduled celebrex and prn Tramadol for moderate pain , maximum 2 daily. 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   Increasing tramadol for use up to 4 times daily .  Refill history confirmed via [place] Controlled Substance databas, accessed by me today.  I have agreed to authorize FMLA in the future if needed,  But currently there is no clear need or pattern to his episodes

## 2020-10-05 NOTE — Assessment & Plan Note (Signed)
Improved with use of essential oils (lavender).  He prefers to continue a reduced use of  clonazepam rx to #30/month . Refill history confirmed via [place] Controlled Substance databas, accessed by me today.Marland Kitchen

## 2020-10-05 NOTE — Assessment & Plan Note (Signed)
His depressive symptoms have responded to wellbutrin and he is controlling his anxiety with prn clonazepam.  Medications discussed,  Risks and benefits of continued use outlined.  Refills given.

## 2020-10-19 NOTE — Progress Notes (Unsigned)
Cardiology Office Note  Date:  10/20/2020   ID:  WOODFIN KISS, DOB 30-Apr-1966, MRN [id number]  PCP:  Crecencio Mc, MD   Chief Complaint  Patient presents with  . Other    OD 12 month f/u LS 11/2017 pt would like to discuss cardiac testing. Meds reviewed verbally with pt.    HPI:  Ronald Cordova is a 55 y.o. male with past medical history of chest pain  shortness of breath Hypertension Hyperlipidemia Palpitations Anxiety family history: brother died suddenly and was told it was from a massive heart attack, at age of 80,  Previously seen by Dr. Farrel Conners in 2017 GAD managed with clonazepam   chronic musculoskeletal pain  Calcium score in 2019: 14 Presents for f/u of his cardiac risk factors  LOV 11/2017 In follow-up today reports weight is stable, Continues to drive a truck, trying to eat well Does not do take out Has been busy, no regular exercise program, parents with medical issues, has to take care of them  Denies any anginal symptoms  Prior CT scans reviewed dating back to 2013, he is concerned about prior diagnosis of lung nodules Requesting repeat CT scan  Non-smoker  Lab work reviewed A1C 5.3 Total chol 130  EKG personally reviewed by myself on todays visit  shows  normal sinus rhythm rate 83 bpm no significant ST or T wave changes  Other past medical history reviewed emergency room July 2018 for chest pain abdominal tenderness   CT scan chest July 2013 Renal CT July 2018 Previous stress test June 2017 no ischemia,   CT scan Renal CT scan and chest CT 2013 No coronary calcifications noted He does have mild distal descending aorta and mild common iliac calcification   PMH:   has a past medical history of Anxiety state, unspecified, Eosinophilic esophagitis (4098), GERD (gastroesophageal reflux disease), Hemorrhoids, Hypertension, Irritable bowel syndrome, Polyp of colon, hyperplastic (01/08/2011), and Treadmill stress test negative for angina pectoris  (2003).  PSH:    Past Surgical History:  Procedure Laterality Date  . APPENDECTOMY     done during high school  . carpal tunnel release  Sept 2008   R hand, by  [place], [place] ARTHROSCOPY  2000   left  . NASAL SINUS SURGERY  1998   bilateral    Current Outpatient Medications  Medication Sig Dispense Refill  . albuterol (VENTOLIN HFA) 108 (90 Base) MCG/ACT inhaler Inhale 2 puffs into the lungs every 6 (six) hours as needed for wheezing or shortness of breath. 3.7 g 11  . amLODipine (NORVASC) 2.5 MG tablet Take 1 tablet (2.5 mg total) by mouth daily. 90 tablet 1  . buPROPion (WELLBUTRIN XL) 150 MG 24 hr tablet Take 1 tablet (150 mg total) by mouth daily. 90 tablet 1  . clonazePAM (KLONOPIN) 1 MG tablet Take 1 tablet (1 mg total) by mouth daily. As needed for anxiety 30 tablet 5  . DEXILANT 60 MG capsule TAKE 1 CAPSULE DAILY 90 capsule 1  . doxazosin (CARDURA) 2 MG tablet Take 1 tablet (2 mg total) by mouth daily. 90 tablet 1  . ezetimibe (ZETIA) 10 MG tablet TAKE 1 TABLET DAILY 90 tablet 2  . fluticasone (FLONASE) 50 MCG/ACT nasal spray Place 1 spray into both nostrils daily. 16 g 1  . ketoconazole (NIZORAL) 2 % shampoo     . losartan-hydrochlorothiazide (HYZAAR) 100-12.5 MG tablet TAKE 1 TABLET DAILY 90 tablet 1  .  methocarbamol (ROBAXIN) 750 MG tablet Take 750 mg by mouth every 8 (eight) hours.    . montelukast (SINGULAIR) 10 MG tablet Take 10 mg by mouth at bedtime.    Marland Kitchen nystatin cream (MYCOSTATIN) Apply to affected area 2 times daily 30 g 0  . ondansetron (ZOFRAN) 4 MG tablet Take 1 tablet (4 mg total) by mouth every 6 (six) hours. 12 tablet 0  . RETIN-A 0.01 % gel     . rosuvastatin (CRESTOR) 5 MG tablet TAKE 1 TABLET DAILY 90 tablet 1  . sildenafil (REVATIO) 20 MG tablet TAKE 3 TO 5 TABLETS BY MOUTH DAILY AS NEEDED 90 tablet 0  . tiZANidine (ZANAFLEX) 4 MG tablet Take 1 tablet (4 mg total) by mouth every 8 (eight) hours as needed for muscle  spasms. 60 tablet 5  . traMADol (ULTRAM) 50 MG tablet Take 1 tablet (50 mg total) by mouth every 6 (six) hours as needed. 120 tablet 5  . triamcinolone ointment (KENALOG) 0.1 % Apply 1 application topically 2 (two) times daily.     No current facility-administered medications for this visit.     Allergies:   Penicillins   Social History:  The patient  reports that he quit smoking about 9 years ago. His smoking use included cigarettes. He has a 15.00 pack-year smoking history. He has never used smokeless tobacco. He reports current alcohol use. He reports that he does not use drugs.   Family History:   family history includes BRCA 1/2 in his maternal aunt; Heart attack (age of onset: 80) in his brother; Hyperlipidemia in his brother, father, mother, and sister; Hypertension in his father and mother.    Review of Systems: Review of Systems  Constitutional: Negative.   Respiratory: Negative.   Cardiovascular: Negative.   Gastrointestinal: Negative.   Musculoskeletal: Negative.   Neurological: Negative.   Psychiatric/Behavioral: Negative.   All other systems reviewed and are negative.    PHYSICAL EXAM: VS:  BP 110/68 (BP Location: Left Arm, Patient Position: Sitting, Cuff Size: Normal)   Pulse 83   Ht _0  (1.753 m)   Wt 210 lb 4 oz (95.4 kg)   SpO2 98%   BMI 31.05 kg/m  , BMI Body mass index is 31.05 kg/m. GEN: Well nourished, well developed, in no acute distress  HEENT: normal  Neck: no JVD, carotid bruits, or masses Cardiac: RRR; no murmurs, rubs, or gallops,no edema  Respiratory:  clear to auscultation bilaterally, normal work of breathing GI: soft, nontender, nondistended, + BS MS: no deformity or atrophy  Skin: warm and dry, no rash Neuro:  Strength and sensation are intact Psych: euthymic mood, full affect   Recent Labs: 07/21/2020: ALT 30; BUN 16; Creatinine, Ser 0.90; Hemoglobin 15.2; Platelets 248.0; Potassium 4.3; Sodium 141; TSH 0.88    Lipid Panel Lab  Results  Component Value Date   CHOL 132 07/21/2020   HDL 43.20 07/21/2020   LDLCALC 68 07/21/2020   TRIG 105.0 07/21/2020      Wt Readings from Last 3 Encounters:  10/20/20 210 lb 4 oz (95.4 kg)  10/03/20 213 lb 6.4 oz (96.8 kg)  09/07/20 220 lb (99.8 kg)      ASSESSMENT AND PLAN:  Chest pain with moderate risk for cardiac etiology  Denies chest pain, Reviewed CT coronary calcium scoring which was low No further work-up at this time  Pulmonary nodule First noted in 2013, no follow-up since that time We have ordered repeat chest CT scan at his request, this  will be done at the imaging center in [place] approved by his work Psychiatric nurse prescription for CT provided  Mixed hyperlipidemia Crestor Zetia, numbers at goal  Essential hypertension Blood pressure is well controlled on today's visit. No changes made to the medications.  Palpitations No significant symptoms No further work-up needed  Shortness of breath Continue weight loss program, Start walking program  Aortic atherosclerosis Seen on CT scan, mild LDL at goal     Total encounter time more than 25 minutes  Greater than 50% was spent in counseling and coordination of care with the patient     Orders Placed This Encounter  Procedures  . CT Chest Wo Contrast  . EKG 12-Lead     Signed, Esmond Plants, M.D., Ph.D. 10/20/2020  [place], [place]

## 2020-10-20 ENCOUNTER — Ambulatory Visit (INDEPENDENT_AMBULATORY_CARE_PROVIDER_SITE_OTHER): Payer: BC Managed Care – PPO | Admitting: Cardiovascular Disease

## 2020-10-20 ENCOUNTER — Other Ambulatory Visit: Payer: Self-pay

## 2020-10-20 ENCOUNTER — Encounter: Payer: Self-pay | Admitting: Cardiovascular Disease

## 2020-10-20 VITALS — BP 110/68 | HR 83 | Ht 69.0 in | Wt 210.2 lb

## 2020-10-20 DIAGNOSIS — I700 Atherosclerosis of aorta: Secondary | ICD-10-CM

## 2020-10-20 DIAGNOSIS — M130 Polyarthritis, unspecified: Secondary | ICD-10-CM

## 2020-10-20 DIAGNOSIS — I10 Essential (primary) hypertension: Secondary | ICD-10-CM | POA: Diagnosis not present

## 2020-10-20 DIAGNOSIS — E782 Mixed hyperlipidemia: Secondary | ICD-10-CM | POA: Diagnosis not present

## 2020-10-20 DIAGNOSIS — F17201 Nicotine dependence, unspecified, in remission: Secondary | ICD-10-CM | POA: Diagnosis not present

## 2020-10-20 DIAGNOSIS — R911 Solitary pulmonary nodule: Secondary | ICD-10-CM

## 2020-10-20 NOTE — Patient Instructions (Addendum)
We will print order for  CT chest noncontrast for lung nodules   Medication Instructions:  Ok to play with the amlodipine (?maybe stop)  If you need a refill on your cardiac medications before your next appointment, please call your pharmacy.    Lab work: No new labs needed   If you have labs (blood work) drawn today and your tests are completely normal, you will receive your results only by: Marland Kitchen MyChart Message (if you have MyChart) OR . A paper copy in the mail If you have any lab test that is abnormal or we need to change your treatment, we will call you to review the results.   Testing/Procedures: No new testing needed   Follow-Up: At [facility], you and your health needs are our priority.  As part of our continuing mission to provide you with exceptional heart care, we have created designated Provider Care Teams.  These Care Teams include your primary Cardiologist (physician) and Advanced Practice Providers (APPs -  Physician Assistants and Nurse Practitioners) who all work together to provide you with the care you need, when you need it.  . You will need a follow up appointment in 12 months  . Providers on your designated Care Team:   . Nicolasa Ducking, NP . Eula Listen, PA-C . Marisue Ivan, PA-C  Any Other Special Instructions Will Be Listed Below (If Applicable).  COVID-19 Vaccine Information can be found at: [email or web address] For questions related to vaccine distribution or appointments, please email [email or web address] or call [phone].

## 2020-12-23 ENCOUNTER — Other Ambulatory Visit: Payer: Self-pay | Admitting: Internal Medicine

## 2020-12-24 ENCOUNTER — Other Ambulatory Visit: Payer: Self-pay | Admitting: Internal Medicine

## 2021-01-02 ENCOUNTER — Ambulatory Visit
Admission: EM | Admit: 2021-01-02 | Discharge: 2021-01-02 | Disposition: A | Discharge status: Home / Self Care | Payer: BC Managed Care – PPO | Attending: Emergency Medicine | Admitting: Emergency Medicine

## 2021-01-02 DIAGNOSIS — A084 Viral intestinal infection, unspecified: Secondary | ICD-10-CM

## 2021-01-02 MED ORDER — ONDANSETRON 4 MG PO TBDP: 4.0000 mg | ORAL_TABLET | Freq: Three times a day (TID) | ORAL | 0 refills | Status: DC | PRN

## 2021-01-02 NOTE — ED Provider Notes (Signed)
Ronald Cordova    CSN: [id number] Arrival date & time: 01/02/21  1254      History   Chief Complaint Chief Complaint  Patient presents with  . Abdominal Pain  . Diarrhea    X 3 days     HPI Ronald Cordova is a 55 y.o. male.   Patient presents with 3 to 4-day history of fever, nausea, vomiting, diarrhea.  T-max 101.  No emesis since 12/31/2020.  Two episodes of diarrhea today.  He also reports mild generalized abdominal discomfort.  He denies rash, sore throat, cough, shortness of breath, or other symptoms.  Treatment attempted at home with 1 tablet of Zofran that he had at home leftover from a previous illness.  He does not have any more Zofran available.  Patient states his symptoms started after he traveled to [place].  His travel companions had some similar symptoms.  His medical history includes IBS, GERD, hypertension.  The history is provided by the patient and medical records.    Past Medical History:  Diagnosis Date  . Anxiety state, unspecified   . Eosinophilic esophagitis 1937   by EGD with  biopsies  . GERD (gastroesophageal reflux disease)    carafate added by Dr. Collene Mares  . Hemorrhoids   . Hypertension   . Irritable bowel syndrome   . Polyp of colon, hyperplastic 01/08/2011   Juanita Craver, repeat due 2018  . Treadmill stress test negative for angina pectoris 2003    Patient Active Problem List   Diagnosis Date Noted  . Edema 12/17/2019  . Colon cancer screening 08/29/2018  . B12 deficiency 08/29/2018  . Allergic rhinitis 04/25/2018  . Aortic atherosclerosis ([place]) 11/21/2017  . Chronic right-sided low back pain without sciatica 09/08/2017  . Fatty liver 03/15/2017  . Vitamin D deficiency 12/23/2016  . Restless legs 12/21/2016  . Ulnar neuropathy at elbow of left upper extremity 06/29/2016  . Family history of sudden death in brother 12/16/2015  . Polyarthritis of multiple sites Dec 16, 2015  . Chronic bilateral thoracic back pain 07/29/2015  .  Palpitations 07/29/2015  . Essential hypertension 11/13/2014  . 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 Obesity (BMI 30.0-34.9) 08/28/2012  . Incidental pulmonary nodule, > 71m and < 836m07/03/2012  . 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 Isolated or specific phobia 01/21/2012  . Chest pain with moderate risk for cardiac etiology 01/21/2012  . Mixed hyperlipidemia 05/09/2011  . Anxiety state   . Irritable bowel syndrome   . Internal hemorrhoids     Past Surgical History:  Procedure Laterality Date  . APPENDECTOMY     done during high school  . carpal tunnel release  Sept 2008   [place]   [place]  . NASAL SINUS SURGERY  1998   bilateral       Home Medications    Prior to Admission medications   Medication Sig Start Date End Date Taking? Authorizing Provider  ondansetron (ZOFRAN ODT) 4 MG disintegrating tablet Take 1 tablet (4 mg total) by mouth every 8 (eight) hours as needed for nausea or vomiting. 01/02/21  Yes TaSharion BalloonNP  albuterol (VENTOLIN HFA) 108 (90 Base) MCG/ACT inhaler Inhale 2 puffs into the lungs every 6 (six) hours as needed for wheezing or shortness of breath. 08/14/20   TuCrecencio McMD  amLODipine (NORVASC) 2.5 MG  tablet Take 1 tablet (2.5 mg total) by mouth daily. 09/05/20   Crecencio Mc, MD  buPROPion (WELLBUTRIN XL) 150 MG 24 hr tablet Take 1 tablet (150 mg total) by mouth daily. 09/05/20   Crecencio Mc, MD  clonazePAM (KLONOPIN) 1 MG tablet Take 1 tablet (1 mg total) by mouth daily. As needed for anxiety 09/13/20   Crecencio Mc, MD  dexlansoprazole (DEXILANT) 60 MG capsule TAKE 1 CAPSULE DAILY 12/24/20   Crecencio Mc, MD  doxazosin (CARDURA) 2 MG tablet TAKE 1 TABLET DAILY 12/24/20   Crecencio Mc, MD  ezetimibe (ZETIA) 10 MG tablet TAKE 1 TABLET DAILY 07/14/20   Crecencio Mc, MD  fluticasone (FLONASE) 50 MCG/ACT  nasal spray Place 1 spray into both nostrils daily. 01/03/20   Crecencio Mc, MD  ketoconazole (NIZORAL) 2 % shampoo  08/25/20   [provider]  losartan-hydrochlorothiazide (HYZAAR) 100-12.5 MG tablet TAKE 1 TABLET DAILY 12/24/20   Crecencio Mc, MD  methocarbamol (ROBAXIN) 750 MG tablet Take 750 mg by mouth every 8 (eight) hours. 04/30/20   [provider]  montelukast (SINGULAIR) 10 MG tablet Take 10 mg by mouth at bedtime.    [provider]  nystatin cream (MYCOSTATIN) Apply to affected area 2 times daily 03/06/20   Sharion Balloon, NP  ondansetron (ZOFRAN) 4 MG tablet Take 1 tablet (4 mg total) by mouth every 6 (six) hours. 06/19/20   Faustino Congress, NP  RETIN-A 0.01 % gel  06/25/19   [provider]  rosuvastatin (CRESTOR) 5 MG tablet TAKE 1 TABLET DAILY 12/24/20   Crecencio Mc, MD  sildenafil (REVATIO) 20 MG tablet TAKE 3 TO 5 TABLETS BY MOUTH DAILY AS NEEDED 09/18/20   Crecencio Mc, MD  tiZANidine (ZANAFLEX) 4 MG tablet Take 1 tablet (4 mg total) by mouth every 8 (eight) hours as needed for muscle spasms. 06/23/20   Crecencio Mc, MD  traMADol (ULTRAM) 50 MG tablet Take 1 tablet (50 mg total) by mouth every 6 (six) hours as needed. 10/03/20   Crecencio Mc, MD  triamcinolone ointment (KENALOG) 0.1 % Apply 1 application topically 2 (two) times daily. 05/06/20   [provider]    Family History Family History  Problem Relation Age of Onset  . Hyperlipidemia Sister   . Hyperlipidemia Brother   . Heart attack Brother 47  . Hyperlipidemia Mother   . Hypertension Mother   . Hyperlipidemia Father   . Hypertension Father   . BRCA 1/2 Maternal Aunt     Social History Social History   Tobacco Use  . Smoking status: Former Smoker    Packs/day: 1.00    Years: 15.00    Pack years: 15.00    Types: Cigarettes    Quit date: 05/17/2011    Years since quitting: 9.6  . Smokeless tobacco: Never Used  Vaping Use  . Vaping Use: Never used   Substance Use Topics  . Alcohol use: Yes    Comment: occasional  . Drug use: No     Allergies   Penicillins   Review of Systems Review of Systems  Constitutional: Negative for chills and fever.  Respiratory: Negative for cough and shortness of breath.   Cardiovascular: Negative for chest pain and palpitations.  Gastrointestinal: Positive for abdominal pain, diarrhea, nausea and vomiting.  Genitourinary: Negative for dysuria and hematuria.  Skin: Negative for color change and rash.  All other systems reviewed and are negative.  Physical Exam Triage Vital Signs ED Triage Vitals  Enc Vitals Group     BP 01/02/21 1308 111/72     Pulse Rate 01/02/21 1308 79     Resp 01/02/21 1308 16     Temp 01/02/21 1308 97.6 F (36.4 C)     Temp Source 01/02/21 1308 Temporal     SpO2 01/02/21 1308 96 %     Weight --      Height --      Head Circumference --      Peak Flow --      Pain Score 01/02/21 1306 0     Pain Loc --      Pain Edu? --      Excl. in [place]? --    No data found.  Updated Vital Signs BP 111/72 (BP Location: Left Arm)   Pulse 79   Temp 97.6 F (36.4 C) (Temporal)   Resp 16   SpO2 96%   Visual Acuity Right Eye Distance:   Left Eye Distance:   Bilateral Distance:    Right Eye Near:   Left Eye Near:    Bilateral Near:     Physical Exam Vitals and nursing note reviewed.  Constitutional:      General: He is not in acute distress.    Appearance: He is well-developed.  HENT:     Head: Normocephalic and atraumatic.     Mouth/Throat:     Mouth: Mucous membranes are moist.  Eyes:     Conjunctiva/sclera: Conjunctivae normal.  Cardiovascular:     Rate and Rhythm: Normal rate and regular rhythm.     Heart sounds: Normal heart sounds.  Pulmonary:     Effort: Pulmonary effort is normal. No respiratory distress.     Breath sounds: Normal breath sounds.  Abdominal:     General: Bowel sounds are normal.     Palpations: Abdomen is soft.     Tenderness:  There is no abdominal tenderness. There is no guarding or rebound.  Musculoskeletal:     Cervical back: Neck supple.  Skin:    General: Skin is warm and dry.  Neurological:     General: No focal deficit present.     Mental Status: He is alert and oriented to person, place, and time.     Gait: Gait normal.  Psychiatric:        Mood and Affect: Mood normal.        Behavior: Behavior normal.      UC Treatments / Results  Labs (all labs ordered are listed, but only abnormal results are displayed) Labs Reviewed  COVID-19, FLU A+B NAA    EKG   Radiology No results found.  Procedures Procedures (including critical care time)  Medications Ordered in UC Medications - No data to display  Initial Impression / Assessment and Plan / UC Course  I have reviewed the triage vital signs and the nursing notes.  Pertinent labs & imaging results that were available during my care of the patient were reviewed by me and considered in my medical decision making (see chart for details).   Viral gastroenteritis.  Influenza and COVID pending.  Instructed patient to self quarantine until the test results are back.  Treating with Zofran.  Instructed patient to keep himself hydrated with clear liquids.  Discussed that he should go to the ED if he feels he is not able to keep himself hydrated at home.  Instructed patient to follow up with PCP if his symptoms  are not improving.  Patient agrees to plan of care.    Final Clinical Impressions(s) / UC Diagnoses   Final diagnoses:  Viral gastroenteritis     Discharge Instructions     Take the antinausea medication as directed.    Keep yourself hydrated with clear liquids, such as water, Gatorade, Pedialyte, Sprite, or ginger ale.    Go to the emergency department if you have acute worsening symptoms.    Your COVID and Influenza tests are pending.  You should self quarantine until the test results are back.    Take Tylenol as needed for fever  or discomfort.    Follow-up with your primary care provider if your symptoms are not improving.          ED Prescriptions    Medication Sig Dispense Auth. Provider   ondansetron (ZOFRAN ODT) 4 MG disintegrating tablet Take 1 tablet (4 mg total) by mouth every 8 (eight) hours as needed for nausea or vomiting. 20 tablet Sharion Balloon, NP     PDMP not reviewed this encounter.   Sharion Balloon, NP 01/02/21 1406

## 2021-01-02 NOTE — ED Triage Notes (Signed)
Patient presents to Urgent Care with complaints of abdominal pain, fever (101.0), vomiting, diarrhea x 3 days. Pt reports traveled to [facility] and returned with these symptoms. Pt sates he had a rapid negative tests.  Has a hx of allergies. Has a hx of GERD and IBS.

## 2021-01-02 NOTE — Discharge Instructions (Signed)
Take the antinausea medication as directed.    Keep yourself hydrated with clear liquids, such as water, Gatorade, Pedialyte, Sprite, or ginger ale.    Go to the emergency department if you have acute worsening symptoms.    Your COVID and Influenza tests are pending.  You should self quarantine until the test results are back.    Take Tylenol as needed for fever or discomfort.    Follow-up with your primary care provider if your symptoms are not improving.

## 2021-01-03 LAB — COVID-19, FLU A+B NAA
Influenza A, NAA: NOT DETECTED
Influenza B, NAA: NOT DETECTED
SARS-CoV-2, NAA: NOT DETECTED

## 2021-01-19 ENCOUNTER — Other Ambulatory Visit: Payer: Self-pay | Admitting: Internal Medicine

## 2021-03-06 ENCOUNTER — Other Ambulatory Visit: Payer: Self-pay | Admitting: Internal Medicine

## 2021-03-09 ENCOUNTER — Other Ambulatory Visit: Payer: Self-pay | Admitting: Internal Medicine

## 2021-03-10 NOTE — Telephone Encounter (Signed)
Alternative sent to MAIL ORDER ,

## 2021-03-12 ENCOUNTER — Telehealth: Payer: Self-pay | Admitting: Internal Medicine

## 2021-03-12 ENCOUNTER — Other Ambulatory Visit (INDEPENDENT_AMBULATORY_CARE_PROVIDER_SITE_OTHER): Payer: BC Managed Care – PPO

## 2021-03-12 ENCOUNTER — Other Ambulatory Visit: Payer: Self-pay

## 2021-03-12 DIAGNOSIS — R81 Glycosuria: Secondary | ICD-10-CM

## 2021-03-12 NOTE — Telephone Encounter (Signed)
Patient informed, Due to the high volume of calls and your symptoms we have to forward your call to our Triage Nurse to expedient your call. Please hold for the transfer.  Patient transferred to [facility]. 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

## 2021-03-12 NOTE — Telephone Encounter (Signed)
Providing access nurse documentation.      

## 2021-03-12 NOTE — Telephone Encounter (Signed)
Called and spoke with Ronald Cordova. 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 Pt had glucose in his urine and was told to call his PCP. Pt states that he has had glucose in his urine before but it cleared up without needing anything. He states that he did see Dr. Darrick Huntsman for the urine glucose and was told to call back if it ever happens again. Pt states that he is having no urinary symptoms or pain and that he is only having a sinus infection and sees ENT on 03/13/21. Pt asks if he is needed to come in to drop off a Urine for a urine sample. I informed the patient that he would need an appointment  to discuss the Urine results and receive any medication. Pt Declined going to an urgent care or ED due to his insurance having a 200 dollar emergency visit co-pay. He states he was told by Access Nurse that Dr. Darrick Huntsman would be able to squeeze him in for an appointment as we had no openings today with our Providers.   Received verbal orders from Dr. Darrick Huntsman to have patient come in today at 03/12/21 to drop off a Urine. Urine Culture and Urinalysis with Micro has been ordered for future labs. Pt is to be scheduled for a video visit on Monday at 1:15pm.

## 2021-03-13 LAB — URINE CULTURE
MICRO NUMBER:: 12174754
Result:: NO GROWTH
SPECIMEN QUALITY:: ADEQUATE

## 2021-03-16 ENCOUNTER — Encounter: Payer: Self-pay | Admitting: Internal Medicine

## 2021-03-16 ENCOUNTER — Telehealth: Payer: BC Managed Care – PPO | Admitting: Internal Medicine

## 2021-03-16 ENCOUNTER — Other Ambulatory Visit: Payer: BC Managed Care – PPO

## 2021-03-16 VITALS — Ht 69.0 in | Wt 210.0 lb

## 2021-03-16 DIAGNOSIS — I10 Essential (primary) hypertension: Secondary | ICD-10-CM

## 2021-03-16 DIAGNOSIS — R81 Glycosuria: Secondary | ICD-10-CM | POA: Diagnosis not present

## 2021-03-16 NOTE — Progress Notes (Signed)
Virtual Visit via [employer]  Note  This visit type was conducted due to national recommendations for restrictions regarding the COVID-19 pandemic (e.g. social distancing).  This format is felt to be most appropriate for this patient at this time.  All issues noted in this document were discussed and addressed.  No physical exam was performed (except for noted visual exam findings with Video Visits).   I connected withNAME@ on 03/16/21 at  1:15 PM EDT by a video enabled telemedicine application and verified that I am speaking with the correct person using two identifiers. Location patient: home Location provider: work or home office Persons participating in the virtual visit: patient, provider  I discussed the limitations, risks, security and privacy concerns of performing an evaluation and management service by telephone and the availability of in person appointments. I also discussed with the patient that there may be a patient responsible charge related to this service. The patient expressed understanding and agreed to proceed.  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 He denies frequency, unintentional weight loss, recent use of prednisone, and has no history of diabetes.   Lab Results  Component Value Date   HGBA1C 5.7 03/16/2021      ROS: See pertinent positives and negatives per HPI.  Past Medical History:  Diagnosis Date   Anxiety state, unspecified    Eosinophilic esophagitis 2353   by EGD with  biopsies   GERD (gastroesophageal reflux disease)    carafate added by Dr. Collene Mares   Hemorrhoids    Hypertension    Irritable bowel syndrome    Polyp of colon, hyperplastic 01/08/2011   Juanita Craver, repeat due 2018   Treadmill stress test negative for angina pectoris 2003    Past Surgical History:  Procedure Laterality Date   APPENDECTOMY     done  during high school   carpal tunnel release  Sept 2008   R hand, by  [place], [place]    JOINT REPLACEMENT     KNEE ARTHROSCOPY  2000   left   NASAL SINUS SURGERY  1998   bilateral    Family History  Problem Relation Age of Onset   Hyperlipidemia Sister    Hyperlipidemia Brother    Heart attack Brother 23   Hyperlipidemia Mother    Hypertension Mother    Hyperlipidemia Father    Hypertension Father    BRCA 1/2 Maternal Aunt     SOCIAL HX:   reports that he quit smoking about 9 years ago. His smoking use included cigarettes. He has a 15.00 pack-year smoking history. He has never used smokeless tobacco. He reports current alcohol use. He reports that he does not use drugs.    Current Outpatient Medications:    clonazePAM (KLONOPIN) 1 MG tablet, Take 1 tablet (1 mg total) by mouth daily. As needed for anxiety, Disp: 30 tablet, Rfl: 5   doxazosin (CARDURA) 2 MG tablet, TAKE 1 TABLET DAILY, Disp: 90 tablet, Rfl: 1   ezetimibe (ZETIA) 10 MG tablet, TAKE 1 TABLET DAILY, Disp: 90 tablet, Rfl: 2   fluticasone (FLONASE) 50 MCG/ACT nasal spray, Place 1 spray into both nostrils daily., Disp: 16 g, Rfl: 1   losartan-hydrochlorothiazide (HYZAAR) 100-12.5 MG tablet, TAKE 1 TABLET DAILY, Disp: 90 tablet, Rfl: 1   nystatin cream (MYCOSTATIN), Apply to affected area 2 times daily, Disp: 30 g, Rfl: 0   omeprazole (PRILOSEC)  20 MG capsule, Take 1 capsule (20 mg total) by mouth daily., Disp: 90 capsule, Rfl: 1   ondansetron (ZOFRAN ODT) 4 MG disintegrating tablet, Take 1 tablet (4 mg total) by mouth every 8 (eight) hours as needed for nausea or vomiting., Disp: 20 tablet, Rfl: 0   RETIN-A 0.01 % gel, , Disp: , Rfl:    rosuvastatin (CRESTOR) 5 MG tablet, TAKE 1 TABLET DAILY, Disp: 90 tablet, Rfl: 1   sildenafil (REVATIO) 20 MG tablet, TAKE 3 TO 5 TABLETS BY MOUTH DAILY AS NEEDED, Disp: 90 tablet, Rfl: 0   tiZANidine (ZANAFLEX) 4 MG tablet, Take 1 tablet (4 mg total) by mouth every 8 (eight) hours as  needed for muscle spasms., Disp: 60 tablet, Rfl: 5   traMADol (ULTRAM) 50 MG tablet, Take 1 tablet (50 mg total) by mouth every 6 (six) hours as needed., Disp: 120 tablet, Rfl: 5   montelukast (SINGULAIR) 10 MG tablet, Take 10 mg by mouth at bedtime. (Patient not taking: Reported on 03/16/2021), Disp: , Rfl:    triamcinolone ointment (KENALOG) 0.1 %, Apply 1 application topically 2 (two) times daily. (Patient not taking: Reported on 03/16/2021), Disp: , Rfl:   EXAM:  VITALS per patient if applicable:  GENERAL: alert, oriented, appears well and in no acute distress  HEENT: atraumatic, conjunttiva clear, no obvious abnormalities on inspection of external nose and ears  NECK: normal movements of the head and neck  LUNGS: on inspection no signs of respiratory distress, breathing rate appears normal, no obvious gross SOB, gasping or wheezing  CV: no obvious cyanosis  MS: moves all visible extremities without noticeable abnormality  PSYCH/NEURO: pleasant and cooperative, no obvious depression or anxiety, speech and thought processing grossly intact  ASSESSMENT AND PLAN:  Discussed the following assessment and plan:  Essential hypertension - Plan: Comprehensive metabolic panel, Lipid panel, Lipid panel, Comprehensive metabolic panel  Glucosuria - Plan: Urinalysis, Routine w reflex microscopic, Hemoglobin A1c, Hemoglobin A1c, Urinalysis, Routine w reflex microscopic  Glucosuria with normal serum glucose  Glucosuria with normal serum glucose He has no history of diabetes.  Formal UA and A1c are both  normal   Lab Results  Component Value Date   HGBA1C 5.7 03/16/2021      I discussed the assessment and treatment plan with the patient. The patient was provided an opportunity to ask questions and all were answered. The patient agreed with the plan and demonstrated an understanding of the instructions.   The patient was advised to call back or seek an in-person evaluation if the symptoms  worsen or if the condition fails to improve as anticipated.   I spent 20 minutes dedicated to the care of this patient on the date of this encounter to include pre-visit review of his medical history,  Face-to-face time with the patient , and post visit ordering of testing and therapeutics.    Crecencio Mc, MD

## 2021-03-17 ENCOUNTER — Other Ambulatory Visit: Payer: Self-pay | Admitting: Internal Medicine

## 2021-03-17 DIAGNOSIS — R81 Glycosuria: Secondary | ICD-10-CM | POA: Insufficient documentation

## 2021-03-17 LAB — URINALYSIS, ROUTINE W REFLEX MICROSCOPIC
Bilirubin Urine: NEGATIVE
Hgb urine dipstick: NEGATIVE
Ketones, ur: NEGATIVE
Leukocytes,Ua: NEGATIVE
Nitrite: NEGATIVE
RBC / HPF: NONE SEEN (ref 0–?)
Specific Gravity, Urine: 1.015 (ref 1.000–1.030)
Total Protein, Urine: NEGATIVE
Urine Glucose: NEGATIVE
Urobilinogen, UA: 0.2 (ref 0.0–1.0)
WBC, UA: NONE SEEN (ref 0–?)
pH: 6 (ref 5.0–8.0)

## 2021-03-17 LAB — HEMOGLOBIN A1C: Hgb A1c MFr Bld: 5.7 % (ref 4.6–6.5)

## 2021-03-17 NOTE — Assessment & Plan Note (Signed)
He has no history of diabetes.  Formal UA and A1c are both  normal   Lab Results  Component Value Date   HGBA1C 5.7 03/16/2021

## 2021-03-17 NOTE — Addendum Note (Signed)
Addended by: Warden Fillers on: 03/17/2021 12:33 PM   Modules accepted: Orders

## 2021-03-18 LAB — COMPREHENSIVE METABOLIC PANEL
ALT: 32 IU/L (ref 0–44)
AST: 20 IU/L (ref 0–40)
Albumin/Globulin Ratio: 2.1 (ref 1.2–2.2)
Albumin: 4.8 g/dL (ref 3.8–4.9)
Alkaline Phosphatase: 71 IU/L (ref 44–121)
BUN/Creatinine Ratio: 15 (ref 9–20)
BUN: 14 mg/dL (ref 6–24)
Bilirubin Total: 0.4 mg/dL (ref 0.0–1.2)
CO2: 23 mmol/L (ref 20–29)
Calcium: 10.1 mg/dL (ref 8.7–10.2)
Chloride: 101 mmol/L (ref 96–106)
Creatinine, Ser: 0.91 mg/dL (ref 0.76–1.27)
Globulin, Total: 2.3 g/dL (ref 1.5–4.5)
Glucose: 93 mg/dL (ref 65–99)
Potassium: 4.2 mmol/L (ref 3.5–5.2)
Sodium: 140 mmol/L (ref 134–144)
Total Protein: 7.1 g/dL (ref 6.0–8.5)
eGFR: 100 mL/min/{1.73_m2} (ref >59)

## 2021-03-18 LAB — LIPID PANEL
Chol/HDL Ratio: 3.1 ratio (ref 0.0–5.0)
Cholesterol, Total: 125 mg/dL (ref 100–199)
HDL: 40 mg/dL (ref >39)
LDL Chol Calc (NIH): 59 mg/dL (ref 0–99)
Triglycerides: 149 mg/dL (ref 0–149)
VLDL Cholesterol Cal: 26 mg/dL (ref 5–40)

## 2021-04-05 ENCOUNTER — Other Ambulatory Visit: Payer: Self-pay | Admitting: Internal Medicine

## 2021-04-06 NOTE — Telephone Encounter (Signed)
RX Refill:Klonopin and tramadol Last Seen:03-16-21 Last ordered:09-13-20 and 10-03-20

## 2021-04-07 NOTE — Telephone Encounter (Signed)
PT called to advise that they need a refill of their Klonopin and tramadol. PT advise does have a upcoming apt next Friday with Dr.Tullo.

## 2021-04-13 ENCOUNTER — Ambulatory Visit (INDEPENDENT_AMBULATORY_CARE_PROVIDER_SITE_OTHER): Payer: BC Managed Care – PPO

## 2021-04-13 ENCOUNTER — Other Ambulatory Visit: Payer: Self-pay

## 2021-04-13 ENCOUNTER — Telehealth: Payer: Self-pay

## 2021-04-13 DIAGNOSIS — Z23 Encounter for immunization: Secondary | ICD-10-CM

## 2021-04-13 NOTE — Progress Notes (Signed)
Patient presented to a shingles vaccine to his right deltoid. Pt voiced no concerns or complaints during time of injection.

## 2021-04-13 NOTE — Telephone Encounter (Signed)
Noted. Waiting on nurse visit scheduled for 04/13/21.

## 2021-04-13 NOTE — Telephone Encounter (Signed)
Pt is scheduled to come in for a nurse visit today for the shingles Vaccination. I did not see documentation for singles order inside recent provider note. Ok to give shingles injection?

## 2021-04-17 ENCOUNTER — Ambulatory Visit: Payer: BC Managed Care – PPO | Admitting: Internal Medicine

## 2021-04-17 ENCOUNTER — Other Ambulatory Visit: Payer: Self-pay

## 2021-04-17 VITALS — BP 108/80 | HR 80 | Temp 97.8°F | Ht 69.0 in | Wt 215.2 lb

## 2021-04-17 DIAGNOSIS — Z113 Encounter for screening for infections with a predominantly sexual mode of transmission: Secondary | ICD-10-CM | POA: Diagnosis not present

## 2021-04-17 DIAGNOSIS — R911 Solitary pulmonary nodule: Secondary | ICD-10-CM | POA: Diagnosis not present

## 2021-04-17 DIAGNOSIS — I10 Essential (primary) hypertension: Secondary | ICD-10-CM | POA: Diagnosis not present

## 2021-04-17 DIAGNOSIS — F330 Major depressive disorder, recurrent, mild: Secondary | ICD-10-CM | POA: Diagnosis not present

## 2021-04-17 DIAGNOSIS — R311 Benign essential microscopic hematuria: Secondary | ICD-10-CM

## 2021-04-17 NOTE — Progress Notes (Deleted)
Subjective:  Patient ID: Ronald Cordova, male    DOB: Dec 07, 1965  Age: 55 y.o. MRN: [id number]  CC: There were no encounter diagnoses.  HPI Ronald Cordova presents for  Chief Complaint  Patient presents with   Follow-up   Multiple issues  Questions about monkey pox vaccine Wants ozempic or wegovy  Pneumonia  vaccine   Pulmonary nodules from 2013  being rescanned by [facility]  This visit occurred during the SARS-CoV-2 public health emergency.  Safety protocols were in place, including screening questions prior to the visit, additional usage of staff PPE, and extensive cleaning of exam room while observing appropriate contact time as indicated for disinfecting solutions.     Outpatient Medications Prior to Visit  Medication Sig Dispense Refill   clonazePAM (KLONOPIN) 1 MG tablet TAKE 1 TABLET (1 MG TOTAL) BY MOUTH DAILY. AS NEEDED FOR ANXIETY 30 tablet 2   doxazosin (CARDURA) 2 MG tablet TAKE 1 TABLET DAILY 90 tablet 1   ezetimibe (ZETIA) 10 MG tablet TAKE 1 TABLET DAILY 90 tablet 2   fluticasone (FLONASE) 50 MCG/ACT nasal spray Place 1 spray into both nostrils daily. 16 g 1   losartan-hydrochlorothiazide (HYZAAR) 100-12.5 MG tablet TAKE 1 TABLET DAILY 90 tablet 1   montelukast (SINGULAIR) 10 MG tablet Take 10 mg by mouth at bedtime.     nystatin cream (MYCOSTATIN) Apply to affected area 2 times daily 30 g 0   omeprazole (PRILOSEC) 20 MG capsule Take 1 capsule (20 mg total) by mouth daily. 90 capsule 1   ondansetron (ZOFRAN ODT) 4 MG disintegrating tablet Take 1 tablet (4 mg total) by mouth every 8 (eight) hours as needed for nausea or vomiting. 20 tablet 0   RETIN-A 0.01 % gel      rosuvastatin (CRESTOR) 5 MG tablet TAKE 1 TABLET DAILY 90 tablet 1   sildenafil (REVATIO) 20 MG tablet TAKE 3 TO 5 TABLETS BY MOUTH DAILY AS NEEDED 90 tablet 0   tiZANidine (ZANAFLEX) 4 MG tablet Take 1 tablet (4 mg total) by mouth every 8 (eight) hours as needed for muscle spasms. 60 tablet 5    traMADol (ULTRAM) 50 MG tablet TAKE 1 TABLET BY MOUTH EVERY 6 HOURS AS NEEDED. 120 tablet 0   triamcinolone ointment (KENALOG) 0.1 % Apply 1 application topically 2 (two) times daily.     No facility-administered medications prior to visit.    Review of Systems;  Patient denies headache, fevers, malaise, unintentional weight loss, skin rash, eye pain, sinus congestion and sinus pain, sore throat, dysphagia,  hemoptysis , cough, dyspnea, wheezing, chest pain, palpitations, orthopnea, edema, abdominal pain, nausea, melena, diarrhea, constipation, flank pain, dysuria, hematuria, urinary  Frequency, nocturia, numbness, tingling, seizures,  Focal weakness, Loss of consciousness,  Tremor, insomnia, depression, anxiety, and suicidal ideation.      Objective:  BP 108/80   Pulse 80   Temp 97.8 F (36.6 C) (Oral)   Ht 5\' 9"  (1.753 m)   Wt 215 lb 3.2 oz (97.6 kg)   SpO2 97%   BMI 31.78 kg/m   BP Readings from Last 3 Encounters:  04/17/21 108/80  01/02/21 111/72  10/20/20 110/68    Wt Readings from Last 3 Encounters:  04/17/21 215 lb 3.2 oz (97.6 kg)  03/16/21 210 lb (95.3 kg)  10/20/20 210 lb 4 oz (95.4 kg)    General appearance: alert, cooperative and appears stated age Ears: normal TM's and external ear canals both ears Throat: lips, mucosa, and tongue normal; teeth  and gums normal Neck: no adenopathy, no carotid bruit, supple, symmetrical, trachea midline and thyroid not enlarged, symmetric, no tenderness/mass/nodules Back: symmetric, no curvature. ROM normal. No CVA tenderness. Lungs: clear to auscultation bilaterally Heart: regular rate and rhythm, S1, S2 normal, no murmur, click, rub or gallop Abdomen: soft, non-tender; bowel sounds normal; no masses,  no organomegaly Pulses: 2+ and symmetric Skin: Skin color, texture, turgor normal. No rashes or lesions Lymph nodes: Cervical, supraclavicular, and axillary nodes normal.  Lab Results  Component Value Date   HGBA1C 5.7  03/16/2021   HGBA1C 5.3 12/03/2019    Lab Results  Component Value Date   CREATININE 0.91 03/16/2021   CREATININE 0.90 07/21/2020   CREATININE 0.89 12/03/2019    Lab Results  Component Value Date   WBC 7.3 07/21/2020   HGB 15.2 07/21/2020   HCT 45.5 07/21/2020   PLT 248.0 07/21/2020   GLUCOSE 93 03/16/2021   CHOL 125 03/16/2021   TRIG 149 03/16/2021   HDL 40 03/16/2021   LDLDIRECT 146.0 07/28/2015   LDLCALC 59 03/16/2021   ALT 32 03/16/2021   AST 20 03/16/2021   NA 140 03/16/2021   K 4.2 03/16/2021   CL 101 03/16/2021   CREATININE 0.91 03/16/2021   BUN 14 03/16/2021   CO2 23 03/16/2021   TSH 0.88 07/21/2020   PSA 0.5 09/24/2019   HGBA1C 5.7 03/16/2021   MICROALBUR <0.7 12/03/2019    No results found.  Assessment & Plan:   Problem List Items Addressed This Visit   None   I am having [name] maintain his montelukast, Retin-A, fluticasone, nystatin cream, triamcinolone ointment, tiZANidine, doxazosin, rosuvastatin, losartan-hydrochlorothiazide, ondansetron, sildenafil, ezetimibe, omeprazole, traMADol, and clonazePAM.  No orders of the defined types were placed in this encounter.   There are no discontinued medications.  Follow-up: No follow-ups on file.   Sherlene Shams, MD

## 2021-04-17 NOTE — Patient Instructions (Signed)
Please schedule another visit to discuss weight loss medication  I do recommend HPV vaccination and STD screening is being done today for HIV<  syphilis and GC/chlamydia  Anti retroviral therapy is recommended as prophylaxis;  this should be discussed at your vis t

## 2021-04-19 DIAGNOSIS — R319 Hematuria, unspecified: Secondary | ICD-10-CM | POA: Insufficient documentation

## 2021-04-19 DIAGNOSIS — Z113 Encounter for screening for infections with a predominantly sexual mode of transmission: Secondary | ICD-10-CM | POA: Insufficient documentation

## 2021-04-19 NOTE — Progress Notes (Signed)
Subjective:  Patient ID: Ronald Cordova, male    DOB: 10-13-1965  Age: 55 y.o. MRN: [id number]  CC: The primary encounter diagnosis was Screen for STD (sexually transmitted disease). Diagnoses of Incidental pulmonary nodule, > 13mm and < 58mm, Major depressive disorder, recurrent episode, 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HCC), Essential hypertension, and Benign essential microscopic hematuria were also pertinent to this visit.  HPI Ronald Cordova presents for  Chief Complaint  Patient presents with   Follow-up   This visit occurred during the SARS-CoV-2 public health emergency.  Safety protocols were in place, including screening questions prior to the visit, additional usage of staff PPE, and extensive cleaning of exam room while observing appropriate contact time as indicated for disinfecting solutions.   Ronald Cordova is a 55 yr old male who has multiple questions today related to sexual practices  1)  he is concerned about his risk for monkey pox,  as well as having questions about the vaccine he is planning on getting.   2)  He is due for STD screening .  He is a homosexual and is currently active only in engaging in oral sex  3) questions about HPV vaccine   4) Questions about using HIV pharmacotherapy prophylactics  5) having follow up on a 4 mm pulmonary nodule first found in 2013.  6) Reviewed the recent finding of hematuria (by dipstick during work related exam)   Outpatient Medications Prior to Visit  Medication Sig Dispense Refill   clonazePAM (KLONOPIN) 1 MG tablet TAKE 1 TABLET (1 MG TOTAL) BY MOUTH DAILY. AS NEEDED FOR ANXIETY 30 tablet 2   doxazosin (CARDURA) 2 MG tablet TAKE 1 TABLET DAILY 90 tablet 1   ezetimibe (ZETIA) 10 MG tablet TAKE 1 TABLET DAILY 90 tablet 2   fluticasone (FLONASE) 50 MCG/ACT nasal spray Place 1 spray into both nostrils daily. 16 g 1   losartan-hydrochlorothiazide (HYZAAR) 100-12.5 MG tablet TAKE 1 TABLET DAILY 90 tablet 1   montelukast (SINGULAIR) 10 MG  tablet Take 10 mg by mouth at bedtime.     nystatin cream (MYCOSTATIN) Apply to affected area 2 times daily 30 g 0   omeprazole (PRILOSEC) 20 MG capsule Take 1 capsule (20 mg total) by mouth daily. 90 capsule 1   ondansetron (ZOFRAN ODT) 4 MG disintegrating tablet Take 1 tablet (4 mg total) by mouth every 8 (eight) hours as needed for nausea or vomiting. 20 tablet 0   RETIN-A 0.01 % gel      rosuvastatin (CRESTOR) 5 MG tablet TAKE 1 TABLET DAILY 90 tablet 1   sildenafil (REVATIO) 20 MG tablet TAKE 3 TO 5 TABLETS BY MOUTH DAILY AS NEEDED 90 tablet 0   tiZANidine (ZANAFLEX) 4 MG tablet Take 1 tablet (4 mg total) by mouth every 8 (eight) hours as needed for muscle spasms. 60 tablet 5   traMADol (ULTRAM) 50 MG tablet TAKE 1 TABLET BY MOUTH EVERY 6 HOURS AS NEEDED. 120 tablet 0   triamcinolone ointment (KENALOG) 0.1 % Apply 1 application topically 2 (two) times daily.     No facility-administered medications prior to visit.    Review of Systems;  Patient denies headache, fevers, malaise, unintentional weight loss, skin rash, eye pain, sinus congestion and sinus pain, sore throat, dysphagia,  hemoptysis , cough, dyspnea, wheezing, chest pain, palpitations, orthopnea, edema, abdominal pain, nausea, melena, diarrhea, constipation, flank pain, dysuria, hematuria, urinary  Frequency, nocturia, numbness, tingling, seizures,  Focal weakness, Loss of consciousness,  Tremor, insomnia, depression, anxiety, and  suicidal ideation.      Objective:  BP 108/80   Pulse 80   Temp 97.8 F (36.6 C) (Oral)   Ht 5\' 9"  (1.753 m)   Wt 215 lb 3.2 oz (97.6 kg)   SpO2 97%   BMI 31.78 kg/m   BP Readings from Last 3 Encounters:  04/17/21 108/80  01/02/21 111/72  10/20/20 110/68    Wt Readings from Last 3 Encounters:  04/17/21 215 lb 3.2 oz (97.6 kg)  03/16/21 210 lb (95.3 kg)  10/20/20 210 lb 4 oz (95.4 kg)    General appearance: alert, cooperative and appears stated age Ears: normal TM's and external ear  canals both ears Throat: lips, mucosa, and tongue normal; teeth and gums normal Neck: no adenopathy, no carotid bruit, supple, symmetrical, trachea midline and thyroid not enlarged, symmetric, no tenderness/mass/nodules Back: symmetric, no curvature. ROM normal. No CVA tenderness. Lungs: clear to auscultation bilaterally Heart: regular rate and rhythm, S1, S2 normal, no murmur, click, rub or gallop Abdomen: soft, non-tender; bowel sounds normal; no masses,  no organomegaly Pulses: 2+ and symmetric Skin: Skin color, texture, turgor normal. No rashes or lesions Lymph nodes: Cervical, supraclavicular, and axillary nodes normal.  Lab Results  Component Value Date   HGBA1C 5.7 03/16/2021   HGBA1C 5.3 12/03/2019    Lab Results  Component Value Date   CREATININE 0.91 03/16/2021   CREATININE 0.90 07/21/2020   CREATININE 0.89 12/03/2019    Lab Results  Component Value Date   WBC 7.3 07/21/2020   HGB 15.2 07/21/2020   HCT 45.5 07/21/2020   PLT 248.0 07/21/2020   GLUCOSE 93 03/16/2021   CHOL 125 03/16/2021   TRIG 149 03/16/2021   HDL 40 03/16/2021   LDLDIRECT 146.0 07/28/2015   LDLCALC 59 03/16/2021   ALT 32 03/16/2021   AST 20 03/16/2021   NA 140 03/16/2021   K 4.2 03/16/2021   CL 101 03/16/2021   CREATININE 0.91 03/16/2021   BUN 14 03/16/2021   CO2 23 03/16/2021   TSH 0.88 07/21/2020   PSA 0.5 09/24/2019   HGBA1C 5.7 03/16/2021   MICROALBUR <0.7 12/03/2019    No results found.  Assessment & Plan:   Problem List Items Addressed This Visit       Unprioritized   Incidental pulmonary nodule, > 43mm and < 35mm    Nodule had not changed with serial imaging and was follow up was stopped . However,  He has requested follow up, and CT has been scheduled.       Major depressive disorder, recurrent episode, 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HCC)    His depressive symptoms have responded to wellbutrin but his  he  Anxiety has been aggravated by the advent of Monkeypox.  He is using   prn clonazepam.  Medications discussed,  Risks and benefits of continued use outlined.  Refills given.       Essential hypertension    Well controlled on current regimen of cardura and Hyzaar. . Renal function stable, no changes today.  Lab Results  Component Value Date   CREATININE 0.91 03/16/2021   Lab Results  Component Value Date   NA 140 03/16/2021   K 4.2 03/16/2021   CL 101 03/16/2021   CO2 23 03/16/2021         Screen for STD (sexually transmitted disease) - Primary    Screening for HIV, syphhilis,  And GC/Chlamydia.       Relevant Orders   HIV Antibody (routine testing w rflx)  RPR   GC/Chlamydia Probe Amp   Hematuria, unspecified    Found on dipstick UA ,  None found on formal urinalysis,  Reassurance provided        I provided  30 minutes  during this encounter reviewing patient's current problems and past preocdures, labs and imaging studies, providing counseling on the above mentioned problems I na face to face visit  , and coordination  of care .   Follow-up: Return in about 4 weeks (around 05/15/2021).   Sherlene Shams, MD

## 2021-04-19 NOTE — Assessment & Plan Note (Signed)
Nodule had not changed with serial imaging and was follow up was stopped . However,  He has requested follow up, and CT has been scheduled.

## 2021-04-19 NOTE — Assessment & Plan Note (Signed)
Found on dipstick UA ,  None found on formal urinalysis,  Reassurance provided

## 2021-04-19 NOTE — Assessment & Plan Note (Addendum)
Well controlled on current regimen of cardura and Hyzaar. . Renal function stable, no changes today.  Lab Results  Component Value Date   CREATININE 0.91 03/16/2021   Lab Results  Component Value Date   NA 140 03/16/2021   K 4.2 03/16/2021   CL 101 03/16/2021   CO2 23 03/16/2021

## 2021-04-19 NOTE — Assessment & Plan Note (Signed)
His depressive symptoms have responded to wellbutrin but his  he  Anxiety has been aggravated by the advent of Monkeypox.  He is using  prn clonazepam.  Medications discussed,  Risks and benefits of continued use outlined.  Refills given.

## 2021-04-19 NOTE — Assessment & Plan Note (Signed)
Screening for HIV, syphhilis,  And GC/Chlamydia.

## 2021-04-21 LAB — HIV ANTIBODY (ROUTINE TESTING W REFLEX): HIV 1&2 Ab, 4th Generation: NONREACTIVE

## 2021-04-21 LAB — RPR: RPR Ser Ql: NONREACTIVE

## 2021-04-23 LAB — GC/CHLAMYDIA PROBE AMP
Chlamydia trachomatis, NAA: NEGATIVE
Neisseria Gonorrhoeae by PCR: NEGATIVE

## 2021-05-04 ENCOUNTER — Ambulatory Visit (INDEPENDENT_AMBULATORY_CARE_PROVIDER_SITE_OTHER): Payer: BC Managed Care – PPO

## 2021-05-04 ENCOUNTER — Other Ambulatory Visit: Payer: Self-pay

## 2021-05-04 DIAGNOSIS — Z23 Encounter for immunization: Secondary | ICD-10-CM

## 2021-05-04 NOTE — Progress Notes (Signed)
Patient presented for HPV vaccine to right deltoid. Pt voiced no concerns or discomfort during time of injection.  Pt scheduled for second HPV vaccine on 06/29/21  Please see vaccine order on 04/17/21 for HPV.

## 2021-05-05 ENCOUNTER — Ambulatory Visit
Admission: EM | Admit: 2021-05-05 | Discharge: 2021-05-05 | Disposition: A | Discharge status: Home / Self Care | Payer: BC Managed Care – PPO | Attending: Emergency Medicine | Admitting: Emergency Medicine

## 2021-05-05 ENCOUNTER — Encounter: Payer: Self-pay | Admitting: Emergency Medicine

## 2021-05-05 DIAGNOSIS — B349 Viral infection, unspecified: Secondary | ICD-10-CM | POA: Diagnosis not present

## 2021-05-05 DIAGNOSIS — R509 Fever, unspecified: Secondary | ICD-10-CM

## 2021-05-05 LAB — POCT URINALYSIS DIP (MANUAL ENTRY)
Glucose, UA: NEGATIVE mg/dL
Leukocytes, UA: NEGATIVE
Nitrite, UA: NEGATIVE
Protein Ur, POC: 30 mg/dL — AB
Spec Grav, UA: 1.02 (ref 1.010–1.025)
Urobilinogen, UA: 2 E.U./dL — AB
pH, UA: 5.5 (ref 5.0–8.0)

## 2021-05-05 MED ORDER — ONDANSETRON 4 MG PO TBDP: 4.0000 mg | ORAL_TABLET | Freq: Three times a day (TID) | ORAL | 0 refills | Status: DC | PRN

## 2021-05-05 NOTE — ED Provider Notes (Signed)
Ronald Cordova    CSN: [id number] Arrival date & time: 05/05/21  1121      History   Chief Complaint Chief Complaint  Patient presents with   Fever   Flank Pain   Dysuria    HPI Ronald Cordova is a 55 y.o. male.  Patient presents with 2-day history of fever, chills, headache, dysuria, frequency, left flank pain.  He also reports nausea.  He denies cough, shortness of breath, abdominal pain, vomiting, diarrhea, or other symptoms.  He has taken Zofran for his nausea but is out and needs a refill.  His medical history includes hypertension, allergic rhinitis, chronic low back pain, IBS, former smoker.  Patient declines Tylenol or ibuprofen here.   The history is provided by the patient and medical records.   Past Medical History:  Diagnosis Date   Anxiety state, unspecified    Eosinophilic esophagitis 7591   by EGD with  biopsies   GERD (gastroesophageal reflux disease)    carafate added by Dr. Collene Mares   Hemorrhoids    Hypertension    Irritable bowel syndrome    Polyp of colon, hyperplastic 01/08/2011   Juanita Craver, repeat due 2018   Treadmill stress test negative for angina pectoris 2003    Patient Active Problem List   Diagnosis Date Noted   Screen for STD (sexually transmitted disease) 04/19/2021   Hematuria, unspecified 04/19/2021   Glucosuria with normal serum glucose 03/17/2021   Edema 12/17/2019   Colon cancer screening 08/29/2018   B12 deficiency 08/29/2018   Allergic rhinitis 04/25/2018   Aortic atherosclerosis ([place]) 11/21/2017   Chronic right-sided low back pain without sciatica 09/08/2017   Fatty liver 03/15/2017   Vitamin D deficiency 12/23/2016   Restless legs 12/21/2016   Ulnar neuropathy at elbow of left upper extremity 06/29/2016   Family history of sudden death in brother 12/10/15   Polyarthritis of multiple sites Dec 10, 2015   Chronic bilateral thoracic back pain 07/29/2015   Palpitations 07/29/2015   Essential hypertension 11/13/2014    Major depressive disorder, recurrent episode, 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[place]) 11/03/2014   Obesity (BMI 30.0-34.9) 08/28/2012   Incidental pulmonary nodule, > 50m and < 858m07/03/2012   Tobacco abuse, 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 Authorizing Provider  ondansetron (ZOFRAN ODT) 4 MG disintegrating tablet Take 1 tablet (4 mg total) by mouth every 8 (eight) hours as needed for nausea or vomiting. 05/05/21  Yes TaSharion BalloonNP  clonazePAM (KLONOPIN) 1 MG tablet TAKE 1 TABLET (1 MG TOTAL) BY MOUTH DAILY. AS NEEDED FOR ANXIETY 04/07/21   TuCrecencio McMD  doxazosin (CARDURA) 2 MG tablet TAKE 1 TABLET DAILY 12/24/20   TuCrecencio McMD  ezetimibe (ZETIA) 10 MG tablet TAKE 1 TABLET DAILY 03/06/21  Crecencio Mc, MD  fluticasone (FLONASE) 50 MCG/ACT nasal spray Place 1 spray into both nostrils daily. 01/03/20   Crecencio Mc, MD  losartan-hydrochlorothiazide (HYZAAR) 100-12.5 MG tablet TAKE 1 TABLET DAILY 12/24/20   Crecencio Mc, MD  montelukast (SINGULAIR) 10 MG tablet Take 10 mg by mouth at bedtime.    [provider]  nystatin cream (MYCOSTATIN) Apply to affected area 2 times daily 03/06/20   Sharion Balloon, NP  omeprazole (PRILOSEC) 20 MG capsule Take 1 capsule (20 mg total) by mouth daily. 03/10/21   Crecencio Mc, MD  RETIN-A 0.01 % gel  06/25/19   [provider]  rosuvastatin (CRESTOR) 5 MG tablet  TAKE 1 TABLET DAILY 12/24/20   Crecencio Mc, MD  sildenafil (REVATIO) 20 MG tablet TAKE 3 TO 5 TABLETS BY MOUTH DAILY AS NEEDED 01/19/21   Crecencio Mc, MD  tiZANidine (ZANAFLEX) 4 MG tablet Take 1 tablet (4 mg total) by mouth every 8 (eight) hours as needed for muscle spasms. 06/23/20   Crecencio Mc, MD  traMADol (ULTRAM) 50 MG tablet TAKE 1 TABLET BY MOUTH EVERY 6 HOURS AS NEEDED. 04/07/21   Crecencio Mc, MD  triamcinolone ointment (KENALOG) 0.1 % Apply 1 application topically 2 (two) times daily. 05/06/20   [provider]    Family History Family History  Problem Relation Age of Onset   Hyperlipidemia Sister    Hyperlipidemia Brother    Heart attack Brother 74   Hyperlipidemia Mother    Hypertension Mother    Hyperlipidemia Father    Hypertension Father    BRCA 1/2 Maternal Aunt     Social History Social History   Tobacco Use   Smoking status: Former    Packs/day: 1.00    Years: 15.00    Pack years: 15.00    Types: Cigarettes    Quit date: 05/17/2011    Years since quitting: 9.9   Smokeless tobacco: Never  Vaping Use   Vaping Use: Never used  Substance Use Topics   Alcohol use: Yes    Comment: occasional   Drug use: No     Allergies   Penicillins   Review of Systems Review of Systems  Constitutional:  Positive for fever. Negative for chills.  HENT:  Negative for ear pain and sore throat.   Respiratory:  Negative for cough and shortness of breath.   Cardiovascular:  Negative for chest pain and palpitations.  Gastrointestinal:  Positive for nausea. Negative for abdominal pain, diarrhea and vomiting.  Genitourinary:  Positive for dysuria, flank pain and frequency. Negative for hematuria.  Skin:  Negative for color change and rash.  Neurological:  Positive for headaches. Negative for syncope.  All other systems reviewed and are negative.   Physical Exam Triage Vital Signs ED Triage Vitals  Enc Vitals Group     BP 05/05/21 1152 (!) 145/81      Pulse Rate 05/05/21 1152 (!) 103     Resp 05/05/21 1152 18     Temp 05/05/21 1152 (!) 102.4 F (39.1 C)     Temp Source 05/05/21 1152 Oral     SpO2 05/05/21 1152 95 %     Weight --      Height --      Head Circumference --      Peak Flow --      Pain Score 05/05/21 1151 0     Pain Loc --      Pain Edu? --  Excl. in GC? --    No data found.  Updated Vital Signs BP (!) 145/81 (BP Location: Left Arm)   Pulse (!) 103   Temp (!) 102.4 F (39.1 C) (Oral)   Resp 18   SpO2 95%   Visual Acuity Right Eye Distance:   Left Eye Distance:   Bilateral Distance:    Right Eye Near:   Left Eye Near:    Bilateral Near:     Physical Exam Vitals and nursing note reviewed.  Constitutional:      General: He is not in acute distress.    Appearance: He is well-developed.  HENT:     Head: Normocephalic and atraumatic.     Right Ear: Tympanic membrane normal.     Left Ear: Tympanic membrane normal.     Nose: Nose normal.     Mouth/Throat:     Mouth: Mucous membranes are moist.     Pharynx: Oropharynx is clear.  Eyes:     Conjunctiva/sclera: Conjunctivae normal.  Cardiovascular:     Rate and Rhythm: Normal rate and regular rhythm.     Heart sounds: Normal heart sounds.  Pulmonary:     Effort: Pulmonary effort is normal. No respiratory distress.     Breath sounds: Normal breath sounds.  Abdominal:     General: Bowel sounds are normal.     Palpations: Abdomen is soft.     Tenderness: There is no abdominal tenderness. There is no right CVA tenderness, left CVA tenderness, guarding or rebound.     Comments: Abdomen nontender.  No CVAT.  Musculoskeletal:     Cervical back: Neck supple.  Skin:    General: Skin is warm and dry.     Findings: No rash.  Neurological:     General: No focal deficit present.     Mental Status: He is alert and oriented to person, place, and time.     Gait: Gait normal.  Psychiatric:        Mood and Affect: Mood normal.        Behavior: Behavior  normal.     UC Treatments / Results  Labs (all labs ordered are listed, but only abnormal results are displayed) Labs Reviewed  POCT URINALYSIS DIP (MANUAL ENTRY) - Abnormal; Notable for the following components:      Result Value   Bilirubin, UA small (*)    Ketones, POC UA trace (5) (*)    Blood, UA trace-intact (*)    Protein Ur, POC =30 (*)    Urobilinogen, UA 2.0 (*)    All other components within normal limits  COVID-19, FLU A+B NAA    EKG   Radiology No results found.  Procedures Procedures (including critical care time)  Medications Ordered in UC Medications - No data to display  Initial Impression / Assessment and Plan / UC Course  I have reviewed the triage vital signs and the nursing notes.  Pertinent labs & imaging results that were available during my care of the patient were reviewed by me and considered in my medical decision making (see chart for details).   Fever, viral illness.  ED precautions discussed.  Urine does not show signs of infection.  Patient declined Tylenol or ibuprofen here but states he will take it once he gets home.  COVID and flu pending.  Instructed patient to self quarantine per CDC guidelines.  Discussed other symptomatic treatment including rest and hydration.  Instructed patient to follow-up with his PCP.  He agrees to  plan of care.   Final Clinical Impressions(s) / UC Diagnoses   Final diagnoses:  Fever, unspecified  Viral illness     Discharge Instructions      Go to the emergency department if your symptoms persist or worsen.  Take Tylenol or ibuprofen for your fever.  Your COVID and flu tests are pending.  You should self quarantine until the test results are back.  Follow up with your primary care provider.          ED Prescriptions     Medication Sig Dispense Auth. Provider   ondansetron (ZOFRAN ODT) 4 MG disintegrating tablet Take 1 tablet (4 mg total) by mouth every 8 (eight) hours as needed for nausea or  vomiting. 20 tablet Sharion Balloon, NP      PDMP not reviewed this encounter.   Sharion Balloon, NP 05/05/21 [phone]

## 2021-05-05 NOTE — Discharge Instructions (Addendum)
Go to the emergency department if your symptoms persist or worsen.  Take Tylenol or ibuprofen for your fever.  Your COVID and flu tests are pending.  You should self quarantine until the test results are back.  Follow up with your primary care provider.

## 2021-05-05 NOTE — ED Triage Notes (Addendum)
Pt here with high fever, nausea, left sided flank pain, chills, increased frequency and pain while urinating x several days. Pt prefers not to take antipyretic here in UC today. Pt inquires about a zofran Rx.

## 2021-05-06 LAB — COVID-19, FLU A+B NAA
Influenza A, NAA: NOT DETECTED
Influenza B, NAA: NOT DETECTED
SARS-CoV-2, NAA: NOT DETECTED

## 2021-05-07 ENCOUNTER — Emergency Department: Payer: BC Managed Care – PPO

## 2021-05-07 ENCOUNTER — Encounter: Payer: Self-pay | Admitting: *Deleted

## 2021-05-07 ENCOUNTER — Telehealth: Payer: Self-pay | Admitting: Internal Medicine

## 2021-05-07 ENCOUNTER — Other Ambulatory Visit: Payer: Self-pay

## 2021-05-07 DIAGNOSIS — J181 Lobar pneumonia, unspecified organism: Secondary | ICD-10-CM | POA: Diagnosis not present

## 2021-05-07 DIAGNOSIS — Z87891 Personal history of nicotine dependence: Secondary | ICD-10-CM | POA: Diagnosis not present

## 2021-05-07 DIAGNOSIS — Z20822 Contact with and (suspected) exposure to covid-19: Secondary | ICD-10-CM | POA: Diagnosis not present

## 2021-05-07 DIAGNOSIS — I10 Essential (primary) hypertension: Secondary | ICD-10-CM | POA: Diagnosis not present

## 2021-05-07 DIAGNOSIS — Z79899 Other long term (current) drug therapy: Secondary | ICD-10-CM | POA: Insufficient documentation

## 2021-05-07 DIAGNOSIS — R509 Fever, unspecified: Secondary | ICD-10-CM | POA: Diagnosis present

## 2021-05-07 LAB — CBC
HCT: 40 % (ref 39.0–52.0)
Hemoglobin: 13.7 g/dL (ref 13.0–17.0)
MCH: 31.1 pg (ref 26.0–34.0)
MCHC: 34.3 g/dL (ref 30.0–36.0)
MCV: 90.7 fL (ref 80.0–100.0)
Platelets: 233 10*3/uL (ref 150–400)
RBC: 4.41 MIL/uL (ref 4.22–5.81)
RDW: 12.7 % (ref 11.5–15.5)
WBC: 9.1 10*3/uL (ref 4.0–10.5)
nRBC: 0 % (ref 0.0–0.2)

## 2021-05-07 LAB — URINALYSIS, COMPLETE (UACMP) WITH MICROSCOPIC
Bacteria, UA: NONE SEEN
Bilirubin Urine: NEGATIVE
Glucose, UA: NEGATIVE mg/dL
Hgb urine dipstick: NEGATIVE
Ketones, ur: 5 mg/dL — AB
Leukocytes,Ua: NEGATIVE
Nitrite: NEGATIVE
Protein, ur: 100 mg/dL — AB
Specific Gravity, Urine: 1.04 — ABNORMAL HIGH (ref 1.005–1.030)
Squamous Epithelial / HPF: NONE SEEN (ref 0–5)
pH: 5 (ref 5.0–8.0)

## 2021-05-07 LAB — COMPREHENSIVE METABOLIC PANEL
ALT: 29 U/L (ref 0–44)
AST: 20 U/L (ref 15–41)
Albumin: 3.5 g/dL (ref 3.5–5.0)
Alkaline Phosphatase: 55 U/L (ref 38–126)
Anion gap: 9 (ref 5–15)
BUN: 17 mg/dL (ref 6–20)
CO2: 24 mmol/L (ref 22–32)
Calcium: 8.5 mg/dL — ABNORMAL LOW (ref 8.9–10.3)
Chloride: 105 mmol/L (ref 98–111)
Creatinine, Ser: 1.09 mg/dL (ref 0.61–1.24)
GFR, Estimated: >60 mL/min (ref >60)
Glucose, Bld: 133 mg/dL — ABNORMAL HIGH (ref 70–99)
Potassium: 3.3 mmol/L — ABNORMAL LOW (ref 3.5–5.1)
Sodium: 138 mmol/L (ref 135–145)
Total Bilirubin: 0.8 mg/dL (ref 0.3–1.2)
Total Protein: 7 g/dL (ref 6.5–8.1)

## 2021-05-07 LAB — GROUP A STREP BY PCR: Group A Strep by PCR: NOT DETECTED

## 2021-05-07 LAB — RESP PANEL BY RT-PCR (FLU A&B, COVID) ARPGX2
Influenza A by PCR: NEGATIVE
Influenza B by PCR: NEGATIVE
SARS Coronavirus 2 by RT PCR: NEGATIVE

## 2021-05-07 LAB — LIPASE, BLOOD: Lipase: 33 U/L (ref 11–51)

## 2021-05-07 NOTE — ED Triage Notes (Signed)
Pt here with fevers, chills, achy, stomach bloating, "feels wheezy" and sore throat, since Monday night. Rec'd HPV vaccine on Monday afternoon. Negative COVID and Flu at UC this week. Last Tylenol 1000 mg around 1730.

## 2021-05-07 NOTE — Telephone Encounter (Signed)
Patient called about the status of message below.

## 2021-05-07 NOTE — Telephone Encounter (Signed)
Providing Access nurse documentation. Pt was evaluated by the Urgent care in [facility] for Fever, flank pain, and dry cough. Pt was given ondansetron on 05/05/21.Pt asks if he should receive any further testing done.

## 2021-05-07 NOTE — Telephone Encounter (Signed)
Patient was seen by urgent care 05/05/21. States all of his testing was negative other than his kidney function being elevated.   Patient still having fever, headache, dry cough, over all not feeling well and unable to sleep. No one around the Patient is sick. Patient has urinary urgency and slight burning with urination.   Patient wanting to know what he should do now? If he should have any further testing done? Please advise

## 2021-05-08 ENCOUNTER — Other Ambulatory Visit: Payer: Self-pay

## 2021-05-08 ENCOUNTER — Emergency Department
Admission: EM | Admit: 2021-05-08 | Discharge: 2021-05-08 | Disposition: A | Discharge status: Home / Self Care | Payer: BC Managed Care – PPO | Attending: Emergency Medicine | Admitting: Emergency Medicine

## 2021-05-08 DIAGNOSIS — J189 Pneumonia, unspecified organism: Secondary | ICD-10-CM

## 2021-05-08 LAB — TROPONIN I (HIGH SENSITIVITY)
Troponin I (High Sensitivity): 6 ng/L (ref <18)
Troponin I (High Sensitivity): 4 ng/L (ref <18)

## 2021-05-08 MED ORDER — LACTATED RINGERS IV BOLUS
1000.0000 mL | Freq: Once | INTRAVENOUS | Status: AC
  Administered 2021-05-08: 1000 mL via INTRAVENOUS

## 2021-05-08 MED ORDER — DOXYCYCLINE HYCLATE 100 MG PO CAPS: 100.0000 mg | ORAL_CAPSULE | Freq: Two times a day (BID) | ORAL | 0 refills | Status: AC

## 2021-05-08 MED ORDER — PREDNISONE 10 MG PO TABS: ORAL_TABLET | ORAL | 0 refills | Status: DC

## 2021-05-08 MED ORDER — SODIUM CHLORIDE 0.9 % IV SOLN
1.0000 g | Freq: Once | INTRAVENOUS | Status: AC
  Administered 2021-05-08: 1 g via INTRAVENOUS
  Filled 2021-05-08: qty 10

## 2021-05-08 MED ORDER — IPRATROPIUM-ALBUTEROL 0.5-2.5 (3) MG/3ML IN SOLN
3.0000 mL | Freq: Once | RESPIRATORY_TRACT | Status: AC
  Administered 2021-05-08: 3 mL via RESPIRATORY_TRACT
  Filled 2021-05-08: qty 3

## 2021-05-08 MED ORDER — CEFPODOXIME PROXETIL 200 MG PO TABS: 200.0000 mg | ORAL_TABLET | Freq: Two times a day (BID) | ORAL | 0 refills | Status: AC

## 2021-05-08 MED ORDER — DOXYCYCLINE HYCLATE 100 MG PO TABS
100.0000 mg | ORAL_TABLET | Freq: Once | ORAL | Status: AC
  Administered 2021-05-08: 100 mg via ORAL
  Filled 2021-05-08: qty 1

## 2021-05-08 MED ORDER — ALBUTEROL SULFATE HFA 108 (90 BASE) MCG/ACT IN AERS: 2.0000 | INHALATION_SPRAY | Freq: Four times a day (QID) | RESPIRATORY_TRACT | 2 refills | Status: DC | PRN

## 2021-05-08 MED ORDER — ACETAMINOPHEN 500 MG PO TABS
1000.0000 mg | ORAL_TABLET | Freq: Once | ORAL | Status: AC
  Administered 2021-05-08: 1000 mg via ORAL
  Filled 2021-05-08: qty 2

## 2021-05-08 NOTE — Telephone Encounter (Signed)
Called and spoke with Ronald Cordova. He states that he would like Dr. Darrick Huntsman to write the work note and would like it to be for 7 days. Pt Was curious on if he should be given a steroid for his pneumonia as well as the antibiotics given at the hospital. Pt was unsure if that was necessary as well.

## 2021-05-08 NOTE — Addendum Note (Signed)
Addended by: Sherlene Shams on: 05/08/2021 03:12 PM   Modules accepted: Orders

## 2021-05-08 NOTE — Telephone Encounter (Signed)
Called to speak with Ronald Cordova and triage. Pt states that he went back to the hospital for an evaluation on 05/07/21 as after he left the Urgent Care he started to feel worse. Pt states that he was diagnosed with Left Lung Pneumonia and was put on antibiotics. Pt states that he is still experiencing SOB and has trouble breathing when laying down flat. Pt states that the Hospital did not give a work note as they were unsure on the time frame to have him out of work.   Pt wanted to ask Dr. Melina Schools opinion on how long he should stay out of work and he will go back to the hospital to have them write a note.   Pt already had an appointment scheduled with Dr. Darrick Huntsman for 05/18/21 to discuss ozempic and flu shot, Ok to change that appointment to a hospital f/u?  [name] wanted to thank Dr. Darrick Huntsman for being such a thorough and amazing doctor and wanted to inform her that the staff at the hospital was "bragging on her" and informing him of how amazing she is and thorough she is. He states that the Doctor he saw, Dr. Don Perking, reminded him of her for the amazing work she did. Ronald Cordova asked for this message to be passed on to Dr. Darrick Huntsman.

## 2021-05-08 NOTE — Discharge Instructions (Addendum)

## 2021-05-08 NOTE — ED Provider Notes (Signed)
[facility] Emergency Department Provider Note  ____________________________________________  Time seen: Approximately 3:05 AM  I have reviewed the triage vital signs and the nursing notes.   HISTORY  Chief Complaint Fever   HPI Ronald Cordova is a 55 y.o. male with a history of anxiety, eosinophilic esophagitis, GERD, hypertension, IBS who presents for evaluation of fever.  Patient reports 5 days of fever, chills, body aches, nausea, sore throat, cough productive of clear sputum.  This evening he started having dyspnea with exertion.  Patient is a former smoker but denies any history of COPD or asthma.  Has felt wheezy at times.  He went to urgent care and had a negative COVID and flu swabs.  Patient recently received HPV vaccine on Monday afternoon which is the day that his symptoms started.  He denies any chest pain, vomiting, diarrhea, abdominal pain.  He did report some flank pain on the left side couple days ago but that has subsided.  Denies any dysuria or hematuria.   Past Medical History:  Diagnosis Date   Anxiety state, unspecified    Eosinophilic esophagitis 3893   by EGD with  biopsies   GERD (gastroesophageal reflux disease)    carafate added by Dr. Collene Mares   Hemorrhoids    Hypertension    Irritable bowel syndrome    Polyp of colon, hyperplastic 01/08/2011   Juanita Craver, repeat due 2018   Treadmill stress test negative for angina pectoris 2003    Patient Active Problem List   Diagnosis Date Noted   Screen for STD (sexually transmitted disease) 04/19/2021   Hematuria, unspecified 04/19/2021   Glucosuria with normal serum glucose 03/17/2021   Edema 12/17/2019   Colon cancer screening 08/29/2018   B12 deficiency 08/29/2018   Allergic rhinitis 04/25/2018   Aortic atherosclerosis ([place]) 11/21/2017   Chronic right-sided low back pain without sciatica 09/08/2017   Fatty liver 03/15/2017   Vitamin D deficiency 12/23/2016   Restless legs  12/21/2016   Ulnar neuropathy at elbow of left upper extremity 06/29/2016   Family history of sudden death in brother 12-05-2015   Polyarthritis of multiple sites 2015-12-05   Chronic bilateral thoracic back pain 07/29/2015   Palpitations 07/29/2015   Essential hypertension 11/13/2014   Major depressive disorder, recurrent episode, 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[place]) 11/03/2014   Obesity (BMI 30.0-34.9) 08/28/2012   Incidental pulmonary nodule, > 37m and < 859m07/03/2012   Tobacco abuse, 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 Authorizing Provider  albuterol (VENTOLIN HFA) 108 (90 Base) MCG/ACT inhaler Inhale 2 puffs into the lungs every 6 (six) hours as needed for wheezing or shortness of breath. 05/08/21  Yes VeAlfred [place]  cefpodoxime (VANTIN) 200 MG tablet Take 1 tablet (200 mg total) by mouth 2 (two) times daily for 10 days. 05/08/21 05/18/21 Yes VeAlfred Levins  [place], MD  doxycycline (VIBRAMYCIN) 100 MG capsule Take 1 capsule (100 mg total) by mouth 2 (two) times daily for 10 days. 05/08/21 05/18/21 Yes [place], [place], MD  clonazePAM (KLONOPIN) 1 MG tablet TAKE 1 TABLET (1 MG TOTAL) BY MOUTH DAILY. AS NEEDED FOR ANXIETY 04/07/21   Crecencio Mc, MD  doxazosin (CARDURA) 2 MG tablet TAKE 1 TABLET DAILY 12/24/20   Crecencio Mc, MD  ezetimibe (ZETIA) 10 MG tablet TAKE 1 TABLET DAILY 03/06/21   Crecencio Mc, MD  fluticasone (FLONASE) 50 MCG/ACT nasal  spray Place 1 spray into both nostrils daily. 01/03/20   Crecencio Mc, MD  losartan-hydrochlorothiazide (HYZAAR) 100-12.5 MG tablet TAKE 1 TABLET DAILY 12/24/20   Crecencio Mc, MD  montelukast (SINGULAIR) 10 MG tablet Take 10 mg by mouth at bedtime.    [provider]  nystatin cream (MYCOSTATIN) Apply to affected area 2 times daily 03/06/20   Sharion Balloon, NP  omeprazole (PRILOSEC) 20 MG capsule Take 1 capsule (20 mg total) by mouth daily. 03/10/21   Crecencio Mc, MD  ondansetron (ZOFRAN ODT) 4 MG disintegrating tablet Take 1 tablet (4 mg total) by mouth every 8 (eight) hours as needed for nausea or vomiting. 05/05/21   Sharion Balloon, NP  RETIN-A 0.01 % gel  06/25/19   [provider]  rosuvastatin (CRESTOR) 5 MG tablet TAKE 1 TABLET DAILY 12/24/20   Crecencio Mc, MD  sildenafil (REVATIO) 20 MG tablet TAKE 3 TO 5 TABLETS BY MOUTH DAILY AS NEEDED 01/19/21   Crecencio Mc, MD  tiZANidine (ZANAFLEX) 4 MG tablet Take 1 tablet (4 mg total) by mouth every 8 (eight) hours as needed for muscle spasms. 06/23/20   Crecencio Mc, MD  traMADol (ULTRAM) 50 MG tablet TAKE 1 TABLET BY MOUTH EVERY 6 HOURS AS NEEDED. 04/07/21   Crecencio Mc, MD  triamcinolone ointment (KENALOG) 0.1 % Apply 1 application topically 2 (two) times daily. 05/06/20   [provider]    Allergies Penicillins  Family History  Problem Relation Age of Onset   Hyperlipidemia Sister    Hyperlipidemia Brother    Heart attack Brother 52   Hyperlipidemia Mother    Hypertension Mother    Hyperlipidemia Father    Hypertension Father    BRCA 1/2 Maternal Aunt     Social History Social History   Tobacco Use   Smoking status: Former    Packs/day: 1.00    Years: 15.00    Pack years: 15.00    Types: Cigarettes    Quit date: 05/17/2011    Years since quitting: 9.9   Smokeless tobacco: Never  Vaping Use   Vaping Use: Never used  Substance Use Topics   Alcohol use: Yes    Comment: occasional    Drug use: No    Review of Systems  Constitutional: + fever, chills, body aches Eyes: Negative for visual changes. ENT: Negative for sore throat. Neck: No neck pain  Cardiovascular: Negative for chest pain. Respiratory: + shortness of breath, cough Gastrointestinal: Negative for abdominal pain, vomiting or diarrhea. Genitourinary: Negative for dysuria. Musculoskeletal: Negative for back pain. Skin: Negative for rash. Neurological: Negative for headaches, weakness or numbness. Psych: No SI or HI  ____________________________________________   PHYSICAL EXAM:  VITAL SIGNS: Vitals:   05/08/21 0249 05/08/21 0412  BP:  112/70  Pulse:  90  Resp:  17  Temp: 99.4 F (37.4 C)   SpO2:  96%  Constitutional: Alert and oriented. Well appearing and in no apparent distress. HEENT:      Head: Normocephalic and atraumatic.         Eyes: Conjunctivae are normal. Sclera is non-icteric.       Mouth/Throat: Mucous membranes are moist.       Neck: Supple with no signs of meningismus. Cardiovascular: Regular rate and rhythm. No murmurs, gallops, or rubs. 2+ symmetrical distal pulses are present in all extremities. No JVD. Respiratory: Normal respiratory effort. Lungs are clear to auscultation bilaterally.  Decreased air movement bilaterally but no wheezing or crackles Gastrointestinal: Soft, non tender, and non distended with positive bowel sounds. No rebound or guarding. Genitourinary: No CVA tenderness. Musculoskeletal:  No edema, cyanosis, or erythema of extremities. Neurologic: Normal speech and language. Face is symmetric. Moving all extremities. No gross focal neurologic deficits are appreciated. Skin: Skin is warm, dry and intact. No rash noted. Psychiatric: Mood and affect are normal. Speech and behavior are normal.  ____________________________________________   LABS (all labs ordered are listed, but only abnormal results are displayed)  Labs Reviewed  COMPREHENSIVE METABOLIC  PANEL - Abnormal; Notable for the following components:      Result Value   Potassium 3.3 (*)    Glucose, Bld 133 (*)    Calcium 8.5 (*)    All other components within normal limits  URINALYSIS, COMPLETE (UACMP) WITH MICROSCOPIC - Abnormal; Notable for the following components:   Color, Urine AMBER (*)    APPearance HAZY (*)    Specific Gravity, Urine 1.040 (*)    Ketones, ur 5 (*)    Protein, ur 100 (*)    All other components within normal limits  GROUP A STREP BY PCR  RESP PANEL BY RT-PCR (FLU A&B, COVID) ARPGX2  LIPASE, BLOOD  CBC  TROPONIN I (HIGH SENSITIVITY)  TROPONIN I (HIGH SENSITIVITY)   ____________________________________________  EKG  ED ECG REPORT I, Rudene Re, the attending physician, personally viewed and interpreted this ECG.  Sinus rhythm with rate of 94, normal intervals, normal axis, no ST elevations or depressions ____________________________________________  RADIOLOGY  I have personally reviewed the images performed during this visit and I agree with the Radiologist's read.   Interpretation by Radiologist:  DG Chest 2 View  Result Date: 05/07/2021 CLINICAL DATA:  Fevers and chills EXAM: CHEST - 2 VIEW COMPARISON:  03/03/2017 FINDINGS: Cardiac shadow is within normal limits. Lungs are well aerated bilaterally. Patchy left perihilar infiltrate is identified primarily within the left upper lobe. No sizable effusion is noted. Degenerative changes of the thoracic spine are noted. IMPRESSION: Left upper lobe pneumonia. Followup PA and lateral chest X-ray is recommended in 3-4 weeks following trial of antibiotic therapy to ensure resolution and exclude underlying malignancy. Electronically Signed   By: Inez Catalina M.D.   On: 05/07/2021 22:22     ____________________________________________   PROCEDURES  Procedure(s) performed:yes .1-3 Lead EKG Interpretation Performed by: Rudene Re, MD Authorized by: Rudene Re, MD      Interpretation: non-specific     ECG rate assessment: normal     Rhythm: sinus rhythm     Ectopy: none     Conduction: normal     Critical Care performed:  None ____________________________________________   INITIAL IMPRESSION / ASSESSMENT AND PLAN / ED COURSE  55 y.o. male with a history of anxiety, eosinophilic esophagitis, GERD, hypertension, IBS who presents for evaluation of fever, productive cough, dyspnea on exertion, malaise, body aches.  Patient is well-appearing with a low-grade fever,  normal work of breathing and normal sats although decreased air movement bilaterally with no wheezing or crackles.  He looks euvolemic.  Ddx COVID, flu, pneumonia, pericarditis, myocarditis, viral syndrome.  COVID and flu negative.  Strep negative.  Labs showing no leukocytosis.  UA negative for UTI.  Chest x-ray visualized by me consistent with left upper lobe pneumonia, confirmed by radiology.  No signs of sepsis or respiratory distress.  With a history of smoking, now dyspnea exertion decreased air movement we will treat with a DuoNeb.  Will give IV Rocephin and doxycycline.  Will give IV fluids for mild dehydration and Tylenol for body aches and fever.  EKG and troponin are pending to rule out pericarditis/myocarditis.  Will reassess for disposition.  Patient placed on telemetry for close monitoring of cardiorespiratory status.  Old medical records review including patient's recent visit to urgent care 3 days ago.   _________________________ 4:50 AM on 05/08/2021 ----------------------------------------- EKG and troponin with no signs of perimyocarditis.  After DuoNeb, IV fluids, and antibiotics patient feels markedly improved.  With normal work of breathing normal sats, moving good air with no wheezing.  No signs of respiratory distress, no signs of hypoxia both at rest and with ambulation.  Will discharge home on a 10-day course of Augmentin, doxycycline and albuterol.  Recommended close  follow-up with his primary care doctor and discussed my standard return precautions for fever after 48 hours of antibiotics, chest pain, or shortness of breath.       _____________________________________________ Please note:  Patient was evaluated in Emergency Department today for the symptoms described in the history of present illness. Patient was evaluated in the context of the global COVID-19 pandemic, which necessitated consideration that the patient might be at risk for infection with the SARS-CoV-2 virus that causes COVID-19. Institutional protocols and algorithms that pertain to the evaluation of patients at risk for COVID-19 are in a state of rapid change based on information released by regulatory bodies including the CDC and federal and state organizations. These policies and algorithms were followed during the patient's care in the ED.  Some ED evaluations and interventions may be delayed as a result of limited staffing during the pandemic.   [place] Controlled Substance Database was reviewed by me. ____________________________________________   FINAL CLINICAL IMPRESSION(S) / ED DIAGNOSES   Final diagnoses:  Community acquired pneumonia of left lung, unspecified part of lung      NEW MEDICATIONS STARTED DURING THIS VISIT:  ED Discharge Orders          Ordered    albuterol (VENTOLIN HFA) 108 (90 Base) MCG/ACT inhaler  Every 6 hours PRN        05/08/21 0449    doxycycline (VIBRAMYCIN) 100 MG capsule  2 times daily        05/08/21 0449    cefpodoxime (VANTIN) 200 MG tablet  2 times daily        05/08/21 0449             Note:  This document was prepared using Dragon voice recognition software and may include unintentional dictation errors.    Alfred Levins, [place], MD 05/08/21 [phone]

## 2021-05-12 ENCOUNTER — Telehealth: Payer: Self-pay | Admitting: Internal Medicine

## 2021-05-12 DIAGNOSIS — R059 Cough, unspecified: Secondary | ICD-10-CM

## 2021-05-12 MED ORDER — BENZONATATE 100 MG PO CAPS: 100.0000 mg | ORAL_CAPSULE | Freq: Three times a day (TID) | ORAL | 0 refills | Status: DC | PRN

## 2021-05-12 NOTE — Addendum Note (Signed)
Addended by: Sandy Salaam on: 05/12/2021 02:30 PM   Modules accepted: Orders

## 2021-05-12 NOTE — Telephone Encounter (Signed)
Yes please send it to the appropriate pharmacy,  there are 3 local ones in his chart

## 2021-05-12 NOTE — Telephone Encounter (Signed)
Spoke with pt to find out what pharmacy to send rx to. Medication has been sent to [facility] on [address]

## 2021-05-12 NOTE — Addendum Note (Signed)
Addended by: Sherlene Shams on: 05/12/2021 12:56 PM   Modules accepted: Orders

## 2021-05-12 NOTE — Telephone Encounter (Signed)
Patient states he was in the hospital and diagnosed with pneumonia. States that Dr Darrick Huntsman had sent in a steroid to help treat this.   Patient states he is still coughing. States when he had COVID that Tessalon Perles helped his cough.   Wanting to know if this could be sent in for him again?

## 2021-05-18 ENCOUNTER — Other Ambulatory Visit: Payer: Self-pay

## 2021-05-18 ENCOUNTER — Ambulatory Visit: Payer: BC Managed Care – PPO | Admitting: Internal Medicine

## 2021-05-18 ENCOUNTER — Other Ambulatory Visit: Payer: Self-pay | Admitting: Internal Medicine

## 2021-05-18 ENCOUNTER — Encounter: Payer: Self-pay | Admitting: Internal Medicine

## 2021-05-18 VITALS — BP 142/78 | HR 93 | Temp 97.8°F | Ht 69.0 in | Wt 223.6 lb

## 2021-05-18 DIAGNOSIS — J189 Pneumonia, unspecified organism: Secondary | ICD-10-CM

## 2021-05-18 DIAGNOSIS — E669 Obesity, unspecified: Secondary | ICD-10-CM | POA: Diagnosis not present

## 2021-05-18 HISTORY — DX: Pneumonia, unspecified organism: J18.9

## 2021-05-18 MED ORDER — ALBUTEROL SULFATE HFA 108 (90 BASE) MCG/ACT IN AERS: 2.0000 | INHALATION_SPRAY | Freq: Four times a day (QID) | RESPIRATORY_TRACT | 2 refills | Status: DC | PRN

## 2021-05-18 MED ORDER — MOUNJARO 2.5 MG/0.5ML ~~LOC~~ SOAJ: 2.5000 mg | SUBCUTANEOUS | 2 refills | Status: DC

## 2021-05-18 NOTE — Assessment & Plan Note (Addendum)
Ronald Cordova  , diagnosed in ER and treatment begun. He is recovering steadily butr continues to feel short of breath if he lies on left side. Lung exam Is normal today and he feels  ready to return to work. Repeat chest x ray will  Be done mid October

## 2021-05-18 NOTE — Patient Instructions (Addendum)
Get a wedge pillow to sleep on to reduce the nighttime cough   I recommend a medication to help you lose weight called Mounjaro.   You can read about it and obtain the $25 copay card on their website: Mounjaro.com  Ronald Cordova is a medication that is taken as a weekly subcutaneous injection. It is not insulin.  It  causes your pancreas to increase its  own insulin secretion  And also slows down the emptying of your stomach,  So it decreases your appetite and helps you lose weight.  The dose for the first 4 weekly doses is 2.5 mg .  You may have mild nausea on the first or second day but this should resolve.  If not , stop the medication.   As long as you are losing weight,  you can continue the dose you are on .  Only increase the dose to  5.0 mg  after 4 weeks if your weight has plateaued.  Let me know when you need a refill and what dose you are taking.     If you need a chewy meal:  Healthy Choice low carb power bowl entrees and FITKITCHEN cauliflower pizza bowls are great low carb entrees that microwave in 5 minutes    the healthy choice entrees are  250 calories   Your NURSE VISIT in NOvember is for HPV and Flu vaccines

## 2021-05-18 NOTE — Progress Notes (Signed)
Subjective:  Patient ID: Ronald Cordova, male    DOB: 09/29/1965  Age: 55 y.o. MRN: [id number]  CC: The primary encounter diagnosis was Pneumonia of left upper lobe due to infectious organism. Diagnoses of Obesity (BMI 30.0-34.9) and Community acquired pneumonia of left upper lobe of lung were also pertinent to this visit.  HPI Ronald Cordova presents for  follow up on  LUL pneumonia  This visit occurred during the SARS-CoV-2 public health emergency.  Safety protocols were in place, including screening questions prior to the visit, additional usage of staff PPE, and extensive cleaning of exam room while observing appropriate contact time as indicated for disinfecting solutions.    He was treated in ER on Sept 22 for  a LUL infiltrate    Still  using albuterol just at night to treat cough . Can't sleep on his left side for entire night,  bc he wakes up feeling smothered .    No diarrhea, taking a probiotic   Obesity:   weight gain over the last 3 years noted. 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  Eats low carb fruits daily   drinks Pure Nourish , a  low glycemic index probiotic shake,   from "The next 56 days" Weight loss program.  Lost 10-15 lbs on the diet but his weight has plateaued.  Adds one cup of uncooked  old fashioned oats to shake .  Lunch is 2pm prepared from home: flash frozen low GI meals,  and skips evening meal but has an apple    Started a walking program with a work buddy,  currently walking  2 times daily   Discussed  weight loss medication  Outpatient Medications Prior to Visit  Medication Sig Dispense Refill   benzonatate (TESSALON) 100 MG capsule Take 1 capsule (100 mg total) by mouth 3 (three) times daily as needed for cough. 30 capsule 0   cefpodoxime (VANTIN) 200 MG tablet Take 1 tablet (200 mg total) by mouth 2 (two) times daily for 10 days. 20 tablet 0   clonazePAM (KLONOPIN) 1 MG tablet TAKE 1 TABLET (1 MG TOTAL) BY MOUTH DAILY. AS NEEDED FOR ANXIETY 30 tablet 2    doxazosin (CARDURA) 2 MG tablet TAKE 1 TABLET DAILY 90 tablet 1   doxycycline (VIBRAMYCIN) 100 MG capsule Take 1 capsule (100 mg total) by mouth 2 (two) times daily for 10 days. 20 capsule 0   ezetimibe (ZETIA) 10 MG tablet TAKE 1 TABLET DAILY 90 tablet 2   fluticasone (FLONASE) 50 MCG/ACT nasal spray Place 1 spray into both nostrils daily. 16 g 1   losartan-hydrochlorothiazide (HYZAAR) 100-12.5 MG tablet TAKE 1 TABLET DAILY 90 tablet 1   montelukast (SINGULAIR) 10 MG tablet Take 10 mg by mouth at bedtime.     nystatin cream (MYCOSTATIN) Apply to affected area 2 times daily 30 g 0   omeprazole (PRILOSEC) 20 MG capsule Take 1 capsule (20 mg total) by mouth daily. 90 capsule 1   ondansetron (ZOFRAN ODT) 4 MG disintegrating tablet Take 1 tablet (4 mg total) by mouth every 8 (eight) hours as needed for nausea or vomiting. 20 tablet 0   predniSONE (DELTASONE) 10 MG tablet 6 tablets on Day 1 , then reduce by 1 tablet daily until gone 21 tablet 0   RETIN-A 0.01 % gel      rosuvastatin (CRESTOR) 5 MG tablet TAKE 1 TABLET DAILY 90 tablet 1   tiZANidine (ZANAFLEX) 4 MG tablet Take 1 tablet (4 mg  total) by mouth every 8 (eight) hours as needed for muscle spasms. 60 tablet 5   triamcinolone ointment (KENALOG) 0.1 % Apply 1 application topically 2 (two) times daily.     albuterol (VENTOLIN HFA) 108 (90 Base) MCG/ACT inhaler Inhale 2 puffs into the lungs every 6 (six) hours as needed for wheezing or shortness of breath. 8 g 2   sildenafil (REVATIO) 20 MG tablet TAKE 3 TO 5 TABLETS BY MOUTH DAILY AS NEEDED 90 tablet 0   traMADol (ULTRAM) 50 MG tablet TAKE 1 TABLET BY MOUTH EVERY 6 HOURS AS NEEDED. 120 tablet 0   No facility-administered medications prior to visit.    Review of Systems;  Patient denies headache, fevers, malaise, unintentional weight loss, skin rash, eye pain, sinus congestion and sinus pain, sore throat, dysphagia,  hemoptysis , cough, dyspnea, wheezing, chest pain, palpitations,  orthopnea, edema, abdominal pain, nausea, melena, diarrhea, constipation, flank pain, dysuria, hematuria, urinary  Frequency, nocturia, numbness, tingling, seizures,  Focal weakness, Loss of consciousness,  Tremor, insomnia, depression, anxiety, and suicidal ideation.      Objective:  BP (!) 142/78   Pulse 93   Temp 97.8 F (36.6 C) (Skin)   Ht 5\' 9"  (1.753 m)   Wt 223 lb 9.6 oz (101.4 kg)   SpO2 97%   BMI 33.02 kg/m   BP Readings from Last 3 Encounters:  05/18/21 (!) 142/78  05/08/21 107/61  05/05/21 (!) 145/81    Wt Readings from Last 3 Encounters:  05/18/21 223 lb 9.6 oz (101.4 kg)  05/07/21 210 lb (95.3 kg)  04/17/21 215 lb 3.2 oz (97.6 kg)    General appearance: alert, cooperative and appears stated age Ears: normal TM's and external ear canals both ears Throat: lips, mucosa, and tongue normal; teeth and gums normal Neck: no adenopathy, no carotid bruit, supple, symmetrical, trachea midline and thyroid not enlarged, symmetric, no tenderness/mass/nodules Back: symmetric, no curvature. ROM normal. No CVA tenderness. Lungs: clear to auscultation bilaterally Heart: regular rate and rhythm, S1, S2 normal, no murmur, click, rub or gallop Abdomen: soft, non-tender; bowel sounds normal; no masses,  no organomegaly Pulses: 2+ and symmetric Skin: Skin color, texture, turgor normal. No rashes or lesions Lymph nodes: Cervical, supraclavicular, and axillary nodes normal.  Lab Results  Component Value Date   HGBA1C 5.7 03/16/2021   HGBA1C 5.3 12/03/2019    Lab Results  Component Value Date   CREATININE 1.09 05/07/2021   CREATININE 0.91 03/16/2021   CREATININE 0.90 07/21/2020    Lab Results  Component Value Date   WBC 9.1 05/07/2021   HGB 13.7 05/07/2021   HCT 40.0 05/07/2021   PLT 233 05/07/2021   GLUCOSE 133 (H) 05/07/2021   CHOL 125 03/16/2021   TRIG 149 03/16/2021   HDL 40 03/16/2021   LDLDIRECT 146.0 07/28/2015   LDLCALC 59 03/16/2021   ALT 29 05/07/2021    AST 20 05/07/2021   NA 138 05/07/2021   K 3.3 (L) 05/07/2021   CL 105 05/07/2021   CREATININE 1.09 05/07/2021   BUN 17 05/07/2021   CO2 24 05/07/2021   TSH 0.88 07/21/2020   PSA 0.5 09/24/2019   HGBA1C 5.7 03/16/2021   MICROALBUR <0.7 12/03/2019    No results found.  Assessment & Plan:   Problem List Items Addressed This Visit       Unprioritized   Community acquired pneumonia    LUL  , diagnosed in ER and treatment begun. He is recovering steadily butr continues to feel short  of breath if he lies on left side. Lung exam Is normal today and he feels  ready to return to work. Repeat chest x ray will  Be done mid October       Relevant Medications   albuterol (VENTOLIN HFA) 108 (90 Base) MCG/ACT inhaler   Obesity (BMI 30.0-34.9)    Risks and benefits of therapy with mounjaro explained.   rx given encouraged to increase days of exercise       Other Visit Diagnoses     Pneumonia of left upper lobe due to infectious organism    -  Primary   Relevant Medications   albuterol (VENTOLIN HFA) 108 (90 Base) MCG/ACT inhaler   Other Relevant Orders   DG Chest 2 View        I spent 30 minutes dedicated to the care of this patient on the date of this encounter to include pre-visit review of his medical history,  Face-to-face time with the patient , and post visit ordering of testing and therapeutics.   Meds ordered this encounter  Medications   DISCONTD: albuterol (VENTOLIN HFA) 108 (90 Base) MCG/ACT inhaler    Sig: Inhale 2 puffs into the lungs every 6 (six) hours as needed for wheezing or shortness of breath.    Dispense:  8 g    Refill:  2   albuterol (VENTOLIN HFA) 108 (90 Base) MCG/ACT inhaler    Sig: Inhale 2 puffs into the lungs every 6 (six) hours as needed for wheezing or shortness of breath.    Dispense:  8 g    Refill:  2   tirzepatide (MOUNJARO) 2.5 MG/0.5ML Pen    Sig: Inject 2.5 mg into the skin once a week.    Dispense:  2 mL    Refill:  2    Medications  Discontinued During This Encounter  Medication Reason   albuterol (VENTOLIN HFA) 108 (90 Base) MCG/ACT inhaler Reorder   albuterol (VENTOLIN HFA) 108 (90 Base) MCG/ACT inhaler     Follow-up: No follow-ups on file.   Sherlene Shams, MD

## 2021-05-18 NOTE — Telephone Encounter (Signed)
RX Refill:tramadol Last Seen:05-18-21 Last ordered:04-07-21

## 2021-05-18 NOTE — Assessment & Plan Note (Signed)
Risks and benefits of therapy with mounjaro explained.   rx given encouraged to increase days of exercise

## 2021-06-01 ENCOUNTER — Other Ambulatory Visit: Payer: BC Managed Care – PPO

## 2021-06-01 ENCOUNTER — Other Ambulatory Visit: Payer: Self-pay

## 2021-06-01 ENCOUNTER — Ambulatory Visit (INDEPENDENT_AMBULATORY_CARE_PROVIDER_SITE_OTHER): Payer: BC Managed Care – PPO

## 2021-06-01 DIAGNOSIS — Z23 Encounter for immunization: Secondary | ICD-10-CM | POA: Diagnosis not present

## 2021-06-01 DIAGNOSIS — J189 Pneumonia, unspecified organism: Secondary | ICD-10-CM

## 2021-06-29 ENCOUNTER — Other Ambulatory Visit: Payer: Self-pay

## 2021-06-29 ENCOUNTER — Ambulatory Visit (INDEPENDENT_AMBULATORY_CARE_PROVIDER_SITE_OTHER): Payer: BC Managed Care – PPO

## 2021-06-29 DIAGNOSIS — Z23 Encounter for immunization: Secondary | ICD-10-CM | POA: Diagnosis not present

## 2021-06-29 NOTE — Progress Notes (Signed)
Patient presented for HPV injection to right deltoid, patient voiced no concerns nor showed any signs of distress during injection.

## 2021-07-03 ENCOUNTER — Other Ambulatory Visit (INDEPENDENT_AMBULATORY_CARE_PROVIDER_SITE_OTHER): Payer: BC Managed Care – PPO

## 2021-07-03 ENCOUNTER — Ambulatory Visit (INDEPENDENT_AMBULATORY_CARE_PROVIDER_SITE_OTHER): Payer: BC Managed Care – PPO

## 2021-07-03 ENCOUNTER — Other Ambulatory Visit: Payer: Self-pay

## 2021-07-03 DIAGNOSIS — Z23 Encounter for immunization: Secondary | ICD-10-CM | POA: Diagnosis not present

## 2021-07-03 DIAGNOSIS — J189 Pneumonia, unspecified organism: Secondary | ICD-10-CM | POA: Diagnosis not present

## 2021-07-03 NOTE — Progress Notes (Signed)
Patient presented for shingrix injection to left deltoid, patient voiced no concerns nor showed any signs of distress during injection. 

## 2021-07-29 ENCOUNTER — Telehealth: Payer: Self-pay | Admitting: Internal Medicine

## 2021-07-29 ENCOUNTER — Other Ambulatory Visit: Payer: Self-pay

## 2021-07-29 ENCOUNTER — Ambulatory Visit
Admission: EM | Admit: 2021-07-29 | Discharge: 2021-07-29 | Disposition: A | Discharge status: Home / Self Care | Payer: BC Managed Care – PPO | Attending: Emergency Medicine | Admitting: Emergency Medicine

## 2021-07-29 DIAGNOSIS — J029 Acute pharyngitis, unspecified: Secondary | ICD-10-CM | POA: Insufficient documentation

## 2021-07-29 DIAGNOSIS — Z20822 Contact with and (suspected) exposure to covid-19: Secondary | ICD-10-CM | POA: Insufficient documentation

## 2021-07-29 LAB — RESP PANEL BY RT-PCR (FLU A&B, COVID) ARPGX2
Influenza A by PCR: NEGATIVE
Influenza B by PCR: NEGATIVE
SARS Coronavirus 2 by RT PCR: NEGATIVE

## 2021-07-29 LAB — GROUP A STREP BY PCR: Group A Strep by PCR: NOT DETECTED

## 2021-07-29 MED ORDER — FLUTICASONE PROPIONATE 50 MCG/ACT NA SUSP: 2.0000 | Freq: Every day | NASAL | 0 refills | Status: DC

## 2021-07-29 MED ORDER — AEROCHAMBER PLUS MISC: 2 refills | Status: AC

## 2021-07-29 NOTE — Discharge Instructions (Addendum)
I will contact you only if your flu, COVID, or strep come back positive.  I will prescribe the appropriate medication based on the lab results.  Saline nasal irrigation with a NeilMed sinus rinse and distilled water as often as you want, Flonase, Benadryl/Maalox mixture. 1 to 2 puffs every 4-6 hours using your spacer from your albuterol inhaler as needed for coughing, wheezing.  1000 mg of Tylenol and 600 mg ibuprofen together 3-4 times a day as needed for pain.  Make sure you drink plenty of extra fluids.  Some people find salt water gargles and  Traditional Medicinal's "Throat Coat" tea helpful. Take 5 mL of liquid Benadryl and 5 mL of Maalox. Mix it together, and then hold it in your mouth for as long as you can and then swallow. You may do this 4 times a day.    Go to www.goodrx.com  or www.costplusdrugs.com to look up your medications. This will give you a list of where you can find your prescriptions at the most affordable prices. Or ask the pharmacist what the cash price is, or if they have any other discount programs available to help make your medication more affordable. This can be less expensive than what you would pay with insurance.

## 2021-07-29 NOTE — Telephone Encounter (Signed)
Pt called in regards to not feeling well. 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 Pt was feeling unwell yesterday 12/13. Today pt is experiencing symptoms chills/fever, nose/throat burning. Pt took a tylenol 12/13 and felt better then felt worse than before soon after. Pt was offered VV for tomorrow at 8:30 with [facility]. Pt decided to go to [facility] in [place] where I placed him on the list to let them know he was coming. Pt will call back if he has any issues.

## 2021-07-29 NOTE — ED Provider Notes (Signed)
HPI  SUBJECTIVE:  Ronald Cordova is a 55 y.o. male who presents with a burning sore throat, burning sinus pain, malaise starting yesterday.  He reports chills, body, headaches, nasal congestion, clear rhinorrhea, sinus pain and pressure, postnasal drip, and an occasional cough accompanied with burning chest pain.  He is currently on doxycycline for dental infection.  No facial swelling, upper dental pain, wheezing, shortness of breath.  He reports nausea last night, but no vomiting, diarrhea, abdominal pain.  No voice changes, drooling, trismus, neck stiffness, sensation of throat swelling shut, belching, waterbrash.  No known COVID, flu, strep exposure.  He got the fourth dose of COVID-vaccine and this years flu vaccine.  He works for [employer] and has had multiple sick exposures.  No antipyretic in the past 6 hours.  He has tried Tylenol, Mucinex lozenges with improvement in his symptoms.  No aggravating factors.  He has a past medical history of GERD, hypertension, pneumonia in September 22 which was treated with doxycycline, frequent strep, COVID December 21.  ZOX:WRUEA, Aris Everts, MD   Past Medical History:  Diagnosis Date   Anxiety state, unspecified    Eosinophilic esophagitis 5409   by EGD with  biopsies   GERD (gastroesophageal reflux disease)    carafate added by Dr. Collene Mares   Hemorrhoids    Hypertension    Irritable bowel syndrome    Polyp of colon, hyperplastic 01/08/2011   Juanita Craver, repeat due 2018   Treadmill stress test negative for angina pectoris 2003    Past Surgical History:  Procedure Laterality Date   APPENDECTOMY     done during high school   carpal tunnel release  Sept 2008   R hand, by  [place], [place]    JOINT REPLACEMENT     KNEE ARTHROSCOPY  2000   left   NASAL SINUS SURGERY  1998   bilateral    Family History  Problem Relation Age of Onset   Hyperlipidemia Sister    Hyperlipidemia Brother    Heart attack Brother 58   Hyperlipidemia Mother    Hypertension  Mother    Hyperlipidemia Father    Hypertension Father    BRCA 1/2 Maternal Aunt     Social History   Tobacco Use   Smoking status: Former    Packs/day: 1.00    Years: 15.00    Pack years: 15.00    Types: Cigarettes    Quit date: 05/17/2011    Years since quitting: 10.2   Smokeless tobacco: Never  Vaping Use   Vaping Use: Never used  Substance Use Topics   Alcohol use: Yes    Comment: occasional   Drug use: No    No current facility-administered medications for this encounter.  Current Outpatient Medications:    clonazePAM (KLONOPIN) 1 MG tablet, TAKE 1 TABLET (1 MG TOTAL) BY MOUTH DAILY. AS NEEDED FOR ANXIETY, Disp: 30 tablet, Rfl: 2   doxazosin (CARDURA) 2 MG tablet, TAKE 1 TABLET DAILY, Disp: 90 tablet, Rfl: 1   doxycycline (VIBRAMYCIN) 100 MG capsule, Take 100 mg by mouth 2 (two) times daily., Disp: , Rfl:    ezetimibe (ZETIA) 10 MG tablet, TAKE 1 TABLET DAILY, Disp: 90 tablet, Rfl: 2   fluticasone (FLONASE) 50 MCG/ACT nasal spray, Place 2 sprays into both nostrils daily., Disp: 16 g, Rfl: 0   losartan-hydrochlorothiazide (HYZAAR) 100-12.5 MG tablet, TAKE 1 TABLET DAILY, Disp: 90 tablet, Rfl: 1   omeprazole (PRILOSEC) 20 MG capsule, Take 1 capsule (20 mg total) by  mouth daily., Disp: 90 capsule, Rfl: 1   ondansetron (ZOFRAN ODT) 4 MG disintegrating tablet, Take 1 tablet (4 mg total) by mouth every 8 (eight) hours as needed for nausea or vomiting., Disp: 20 tablet, Rfl: 0   RETIN-A 0.01 % gel, , Disp: , Rfl:    rosuvastatin (CRESTOR) 5 MG tablet, TAKE 1 TABLET DAILY, Disp: 90 tablet, Rfl: 1   sildenafil (REVATIO) 20 MG tablet, TAKE 3 TO 5 TABLETS BY MOUTH DAILY AS NEEDED, Disp: 90 tablet, Rfl: 0   Spacer/Aero-Holding Chambers (AEROCHAMBER PLUS) inhaler, Use with inhaler, Disp: 1 each, Rfl: 2   tiZANidine (ZANAFLEX) 4 MG tablet, Take 1 tablet (4 mg total) by mouth every 8 (eight) hours as needed for muscle spasms., Disp: 60 tablet, Rfl: 5   traMADol (ULTRAM) 50 MG tablet,  Take 1 tablet (50 mg total) by mouth every 6 (six) hours as needed., Disp: 120 tablet, Rfl: 5   triamcinolone ointment (KENALOG) 0.1 %, Apply 1 application topically 2 (two) times daily., Disp: , Rfl:    albuterol (VENTOLIN HFA) 108 (90 Base) MCG/ACT inhaler, Inhale 2 puffs into the lungs every 6 (six) hours as needed for wheezing or shortness of breath., Disp: 8 g, Rfl: 2   benzonatate (TESSALON) 100 MG capsule, Take 1 capsule (100 mg total) by mouth 3 (three) times daily as needed for cough., Disp: 30 capsule, Rfl: 0   montelukast (SINGULAIR) 10 MG tablet, Take 10 mg by mouth at bedtime., Disp: , Rfl:    nystatin cream (MYCOSTATIN), Apply to affected area 2 times daily, Disp: 30 g, Rfl: 0   predniSONE (DELTASONE) 10 MG tablet, 6 tablets on Day 1 , then reduce by 1 tablet daily until gone, Disp: 21 tablet, Rfl: 0   tirzepatide (MOUNJARO) 2.5 MG/0.5ML Pen, Inject 2.5 mg into the skin once a week., Disp: 2 mL, Rfl: 2  Allergies  Allergen Reactions   Penicillin G Other (See Comments)   Penicillins Hives    Has patient had a PCN reaction causing immediate rash, facial/tongue/throat swelling, SOB or lightheadedness with hypotension: No Has patient had a PCN reaction causing severe rash involving mucus membranes or skin necrosis: No Has patient had a PCN reaction that required hospitalization: No Has patient had a PCN reaction occurring within the last 10 years: No If all of the above answers are "NO", then may proceed with Cephalosporin use.      ROS  As noted in HPI.   Physical Exam  BP 132/76 (BP Location: Right Arm)    Pulse 84    Temp 98.1 F (36.7 C) (Oral)    Resp 20    Wt 98.9 kg    SpO2 100%    BMI 32.19 kg/m   Constitutional: Well developed, well nourished, no acute distress Eyes:  EOMI, conjunctiva normal bilaterally HENT: Normocephalic, atraumatic,mucus membranes moist.  Mild frontal, maxillary sinus tenderness.  Positive clear nasal congestion.  Erythematous, swollen  turbinates.  Normal tonsils without exudates.  Slightly erythematous oropharynx.  Positive postnasal drip. Neck: Positive cervical lymphadenopathy Respiratory: Normal inspiratory effort.  Single wheeze left upper lobe that cleared with repeat breath.  No rales, rhonchi.  Cardiovascular: Normal rate and rhythm, no murmurs, rubs, gallops. GI: nondistended skin: No rash, skin intact Musculoskeletal: no deformities Neurologic: Alert & oriented x 3, no focal neuro deficits Psychiatric: Speech and behavior appropriate   ED Course   Medications - No data to display  Orders Placed This Encounter  Procedures   Resp Panel by  RT-PCR (Flu A&B, Covid) Nasopharyngeal Swab    Standing Status:   Standing    Number of Occurrences:   1   Group A Strep by PCR    Standing Status:   Standing    Number of Occurrences:   1   Airborne and Contact precautions    Standing Status:   Standing    Number of Occurrences:   1    Results for orders placed or performed during the hospital encounter of 07/29/21 (from the past 24 hour(s))  Resp Panel by RT-PCR (Flu A&B, Covid) Nasopharyngeal Swab     Status: None   Collection Time: 07/29/21  5:04 PM   Specimen: Nasopharyngeal Swab; Nasopharyngeal(NP) swabs in vial transport medium  Result Value Ref Range   SARS Coronavirus 2 by RT PCR NEGATIVE NEGATIVE   Influenza A by PCR NEGATIVE NEGATIVE   Influenza B by PCR NEGATIVE NEGATIVE  Group A Strep by PCR     Status: None   Collection Time: 07/29/21  5:04 PM   Specimen: Throat; Sterile Swab  Result Value Ref Range   Group A Strep by PCR NOT DETECTED NOT DETECTED   No results found.  ED Clinical Impression  1. Sore throat   2. Encounter for laboratory testing for COVID-19 virus      ED Assessment/Plan  Checking COVID, flu, strep.  Will send home with Tamiflu if influenza is positive, Molnupiravir if COVID is positive, and antibiotics if strep is positive.   will contact patient at [phone] if any of  these labs are positive.  He also has MyChart.  In the meantime, saline nasal irrigation, Flonase, Benadryl/Maalox mixture, work note for 2 days.  Will prescribe a spacer for his albuterol inhaler.  Advised him to take 1 to 2 puffs every 4-6 hours as needed for coughing, wheezing.  Strep, COVID flu, COVID PCR negative.  Patient notified via [place].  Supportive treatment as above.  Discussed labs, MDM, treatment plan, and plan for follow-up with patient. Discussed sn/sx that should prompt return to the ED. patient agrees with plan.   Meds ordered this encounter  Medications   fluticasone (FLONASE) 50 MCG/ACT nasal spray    Sig: Place 2 sprays into both nostrils daily.    Dispense:  16 g    Refill:  0   Spacer/Aero-Holding Chambers (AEROCHAMBER PLUS) inhaler    Sig: Use with inhaler    Dispense:  1 each    Refill:  2    Please educate patient on use      *This clinic note was created using Dragon dictation software. Therefore, there may be occasional mistakes despite careful proofreading.  ?    Melynda Ripple, MD 07/29/21 2122

## 2021-07-29 NOTE — Telephone Encounter (Signed)
FYI

## 2021-07-29 NOTE — ED Triage Notes (Signed)
Patient is here today for "Flu like symptoms". Started "yesterday" with not feeling good "drained". "Couldn't get warm last night". Tylenol/OTC helped some. Nose is "on fire". Throat is "on fire". 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 Flu vaccine done. COVID19 vaccine "not done". Recent exposure & test for COVID19 "Negative". No fever.

## 2021-09-21 ENCOUNTER — Other Ambulatory Visit: Payer: Self-pay | Admitting: Internal Medicine

## 2021-11-09 ENCOUNTER — Other Ambulatory Visit: Payer: Self-pay | Admitting: Internal Medicine

## 2021-11-09 ENCOUNTER — Telehealth: Payer: Self-pay | Admitting: Internal Medicine

## 2021-11-09 MED ORDER — LANSOPRAZOLE 30 MG PO CPDR: 30.0000 mg | DELAYED_RELEASE_CAPSULE | Freq: Every day | ORAL | 5 refills | Status: DC

## 2021-11-09 NOTE — Telephone Encounter (Signed)
Pt advised.

## 2021-11-09 NOTE — Telephone Encounter (Signed)
Let patient know that I will send in lansoprazole instead.  Thanks

## 2021-11-09 NOTE — Telephone Encounter (Addendum)
Pt called in stating that he is on medication (omeprazole (PRILOSEC) 20 MG capsule)... Pt requesting for medication change... Pt stated that the medication leave a bad taste in his mouth... Pt stated that when he takes medication he burps a lot and he have a bad taste after burping... Pt stated that he talked with pharmacist and they advised him he may need to change his medication to lansoprazole... Pt requesting callback...  ?

## 2021-11-10 ENCOUNTER — Other Ambulatory Visit: Payer: Self-pay | Admitting: Internal Medicine

## 2021-11-16 ENCOUNTER — Other Ambulatory Visit: Payer: Self-pay

## 2021-11-16 ENCOUNTER — Encounter: Payer: Self-pay | Admitting: Internal Medicine

## 2021-11-16 ENCOUNTER — Encounter: Payer: Self-pay | Admitting: Pharmacist

## 2021-11-16 ENCOUNTER — Ambulatory Visit: Payer: BC Managed Care – PPO

## 2021-11-16 ENCOUNTER — Ambulatory Visit: Payer: BC Managed Care – PPO | Admitting: Internal Medicine

## 2021-11-16 VITALS — BP 114/60 | HR 87 | Temp 98.2°F | Ht 69.0 in | Wt 225.0 lb

## 2021-11-16 DIAGNOSIS — R5383 Other fatigue: Secondary | ICD-10-CM | POA: Diagnosis not present

## 2021-11-16 DIAGNOSIS — Z23 Encounter for immunization: Secondary | ICD-10-CM

## 2021-11-16 DIAGNOSIS — K760 Fatty (change of) liver, not elsewhere classified: Secondary | ICD-10-CM

## 2021-11-16 DIAGNOSIS — E538 Deficiency of other specified B group vitamins: Secondary | ICD-10-CM

## 2021-11-16 DIAGNOSIS — F520 Hypoactive sexual desire disorder: Secondary | ICD-10-CM | POA: Diagnosis not present

## 2021-11-16 DIAGNOSIS — I10 Essential (primary) hypertension: Secondary | ICD-10-CM | POA: Diagnosis not present

## 2021-11-16 DIAGNOSIS — R81 Glycosuria: Secondary | ICD-10-CM

## 2021-11-16 DIAGNOSIS — Z1211 Encounter for screening for malignant neoplasm of colon: Secondary | ICD-10-CM

## 2021-11-16 DIAGNOSIS — E782 Mixed hyperlipidemia: Secondary | ICD-10-CM | POA: Diagnosis not present

## 2021-11-16 DIAGNOSIS — I700 Atherosclerosis of aorta: Secondary | ICD-10-CM

## 2021-11-16 DIAGNOSIS — E669 Obesity, unspecified: Secondary | ICD-10-CM

## 2021-11-16 DIAGNOSIS — E291 Testicular hypofunction: Secondary | ICD-10-CM

## 2021-11-16 LAB — HEPATIC FUNCTION PANEL
ALT: 33 U/L (ref 0–53)
AST: 19 U/L (ref 0–37)
Albumin: 4.6 g/dL (ref 3.5–5.2)
Alkaline Phosphatase: 53 U/L (ref 39–117)
Bilirubin, Direct: 0.2 mg/dL (ref 0.0–0.3)
Total Bilirubin: 0.9 mg/dL (ref 0.2–1.2)
Total Protein: 6.8 g/dL (ref 6.0–8.3)

## 2021-11-16 LAB — URINALYSIS, ROUTINE W REFLEX MICROSCOPIC
Bilirubin Urine: NEGATIVE
Hgb urine dipstick: NEGATIVE
Ketones, ur: NEGATIVE
Leukocytes,Ua: NEGATIVE
Nitrite: NEGATIVE
RBC / HPF: NONE SEEN (ref 0–?)
Specific Gravity, Urine: 1.015 (ref 1.000–1.030)
Total Protein, Urine: NEGATIVE
Urine Glucose: NEGATIVE
Urobilinogen, UA: 1 (ref 0.0–1.0)
pH: 6.5 (ref 5.0–8.0)

## 2021-11-16 LAB — CBC WITH DIFFERENTIAL/PLATELET
Basophils Absolute: 0 10*3/uL (ref 0.0–0.1)
Basophils Relative: 0.7 % (ref 0.0–3.0)
Eosinophils Absolute: 0.3 10*3/uL (ref 0.0–0.7)
Eosinophils Relative: 3.9 % (ref 0.0–5.0)
HCT: 46.5 % (ref 39.0–52.0)
Hemoglobin: 15.5 g/dL (ref 13.0–17.0)
Lymphocytes Relative: 35.5 % (ref 12.0–46.0)
Lymphs Abs: 2.3 10*3/uL (ref 0.7–4.0)
MCHC: 33.3 g/dL (ref 30.0–36.0)
MCV: 90.1 fl (ref 78.0–100.0)
Monocytes Absolute: 0.5 10*3/uL (ref 0.1–1.0)
Monocytes Relative: 7.5 % (ref 3.0–12.0)
Neutro Abs: 3.4 10*3/uL (ref 1.4–7.7)
Neutrophils Relative %: 52.4 % (ref 43.0–77.0)
Platelets: 251 10*3/uL (ref 150.0–400.0)
RBC: 5.16 Mil/uL (ref 4.22–5.81)
RDW: 14.1 % (ref 11.5–15.5)
WBC: 6.5 10*3/uL (ref 4.0–10.5)

## 2021-11-16 LAB — COMPREHENSIVE METABOLIC PANEL
ALT: 33 U/L (ref 0–53)
AST: 19 U/L (ref 0–37)
Albumin: 4.6 g/dL (ref 3.5–5.2)
Alkaline Phosphatase: 53 U/L (ref 39–117)
BUN: 13 mg/dL (ref 6–23)
CO2: 30 mEq/L (ref 19–32)
Calcium: 9.8 mg/dL (ref 8.4–10.5)
Chloride: 104 mEq/L (ref 96–112)
Creatinine, Ser: 0.9 mg/dL (ref 0.40–1.50)
GFR: 96.41 mL/min (ref >60.00)
Glucose, Bld: 93 mg/dL (ref 70–99)
Potassium: 4 mEq/L (ref 3.5–5.1)
Sodium: 140 mEq/L (ref 135–145)
Total Bilirubin: 0.9 mg/dL (ref 0.2–1.2)
Total Protein: 6.8 g/dL (ref 6.0–8.3)

## 2021-11-16 LAB — LIPID PANEL
Cholesterol: 126 mg/dL (ref 0–200)
HDL: 35.4 mg/dL — ABNORMAL LOW (ref >39.00)
LDL Cholesterol: 66 mg/dL (ref 0–99)
NonHDL: 90.25
Total CHOL/HDL Ratio: 4
Triglycerides: 121 mg/dL (ref 0.0–149.0)
VLDL: 24.2 mg/dL (ref 0.0–40.0)

## 2021-11-16 LAB — TESTOSTERONE: Testosterone: 232.88 ng/dL — ABNORMAL LOW (ref 300.00–890.00)

## 2021-11-16 LAB — HEMOGLOBIN A1C: Hgb A1c MFr Bld: 5.7 % (ref 4.6–6.5)

## 2021-11-16 LAB — VITAMIN B12: Vitamin B-12: 296 pg/mL (ref 211–911)

## 2021-11-16 MED ORDER — SEMAGLUTIDE-WEIGHT MANAGEMENT 0.25 MG/0.5ML ~~LOC~~ SOAJ
0.2500 mg | SUBCUTANEOUS | 2 refills | Status: DC
  Filled 2021-11-16: qty 2, 28d supply, fill #0

## 2021-11-16 NOTE — Assessment & Plan Note (Signed)
Well controlled on current regimen of losartan/hct 100/25. Renal function stable, no changes today. ?

## 2021-11-16 NOTE — Addendum Note (Signed)
Addended by: Lawrence Santiago A on: 11/16/2021 11:30 AM ? ? Modules accepted: Orders ? ?

## 2021-11-16 NOTE — Assessment & Plan Note (Signed)
Reviewed findings of prior CT scan today..  Patient is tolerating 5 mg low dose of crestor,  Which was added to zetia  ?

## 2021-11-16 NOTE — Assessment & Plan Note (Signed)
He is overdue to screening ,  history of TA removed in 2017 .  Referral to Dr Loreta Ave in progress  ?

## 2021-11-16 NOTE — Patient Instructions (Signed)
The generic Wegovy rx was sent to [facility] ? ?If you are able to obtain It  schedule a nurse visit to learn how to self administer ? ?If your screening labs for fatigue are normal, a repeat sleep study will be recommended ? ?Find 30 minutes to exercise  daily  or every other day ? ?3rd and final HPV vaccine given today ? ? ?Referral to dr Collene Mares in progress  ? ?

## 2021-11-16 NOTE — Assessment & Plan Note (Signed)
He was unable to start mounjaro due to cost.  rx for [facility] sent to [facility].  encouraged to increase days of exercise  ?

## 2021-11-16 NOTE — Assessment & Plan Note (Addendum)
Has not been taking supplement

## 2021-11-16 NOTE — Assessment & Plan Note (Addendum)
Etiology unclear.  Will screen for thyroid, anemia,  hepatic and renal insufficiency, encourage regular exercise 5 days /week,  consider sleep study if  Snoring noted or exertional dyspnea reported ?

## 2021-11-16 NOTE — Progress Notes (Signed)
? ?Subjective:  ?Patient ID: Ronald Cordova, male    DOB: 1965-12-07  Age: 56 y.o. MRN: [id number] ? ?CC: The primary encounter diagnosis was Essential hypertension. Diagnoses of Mixed hyperlipidemia, Fatigue, unspecified type, Glucosuria with normal serum glucose, Fatty liver, Colon cancer screening, Glucosuria, Lack of libido, Other fatigue, B12 deficiency, Hypogonadism male, Aortic atherosclerosis ([place]), and Obesity (BMI 30.0-34.9) were also pertinent to this visit. ? ? ?This visit occurred during the SARS-CoV-2 public health emergency.  Safety protocols were in place, including screening questions prior to the visit, additional usage of staff PPE, and extensive cleaning of exam room while observing appropriate contact time as indicated for disinfecting solutions.   ? ?HPI ?Ronald Cordova presents for  ?Chief Complaint  ?Patient presents with  ? Follow-up  ?  29mof/u HTN  ? 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 Wants to try wegovy,  rx sent to [facility] since other pharamacy would not cover.   ? ? ?2) GERD:  using alternative to prilosec,  lansoprazole .  Symptoms better but still burping with a foul odor noted ? ?3) HTN:  Patient is taking losartan/hct 100/25  as prescribed and notes no adverse effects.  Home BP readings have been done about once per week and are  generally < 130/80 .  She is avoiding added salt in her diet and walking regularly about 3 times per week for exercise  .  ? ?4) Fatigue: not sleeping well.  Some days not energy.  Remote sleep study was borderline.  Wants testosterone checked.  NOT TAKING B12 DESPITE diagnosis in 2020  ? ?Outpatient Medications Prior to Visit  ?Medication Sig Dispense Refill  ? albuterol (VENTOLIN HFA) 108 (90 Base) MCG/ACT inhaler Inhale 2 puffs into the lungs every 6 (six) hours as needed for wheezing or shortness of breath. 8 g 2  ? clonazePAM (KLONOPIN) 1 MG tablet TAKE 1  TABLET (1 MG TOTAL) BY MOUTH DAILY. AS NEEDED FOR ANXIETY 30 tablet 1  ? doxazosin (CARDURA) 2 MG tablet TAKE 1 TABLET DAILY 90 tablet 1  ? ezetimibe (ZETIA) 10 MG tablet TAKE 1 TABLET DAILY 90 tablet 2  ? fluticasone (FLONASE) 50 MCG/ACT nasal spray Place 2 sprays into both nostrils daily. 16 g 0  ? lansoprazole (PREVACID) 30 MG capsule Take 1 capsule (30 mg total) by mouth daily at 12 noon. 30 capsule 5  ? losartan-hydrochlorothiazide (HYZAAR) 100-12.5 MG tablet TAKE 1 TABLET DAILY 90 tablet 1  ? montelukast (SINGULAIR) 10 MG tablet Take 10 mg by mouth at bedtime.    ? nystatin cream (MYCOSTATIN) Apply to affected area 2 times daily 30 g 0  ? ondansetron (ZOFRAN ODT) 4 MG disintegrating tablet Take 1 tablet (4 mg total) by mouth every 8 (eight) hours as needed for nausea or vomiting. 20 tablet 0  ? RETIN-A 0.01 % gel     ? rosuvastatin (CRESTOR) 5 MG tablet TAKE 1 TABLET DAILY 90 tablet 1  ? sildenafil (REVATIO) 20 MG tablet TAKE 3 TO 5 TABLETS BY MOUTH DAILY AS NEEDED 90 tablet 0  ? Spacer/Aero-Holding Chambers (AEROCHAMBER PLUS) inhaler Use with inhaler 1 each 2  ? tirzepatide (MOUNJARO) 2.5 MG/0.5ML Pen Inject 2.5 mg into the skin once a week. 2 mL 2  ? tiZANidine (ZANAFLEX) 4 MG tablet Take 1 tablet (4 mg total) by mouth every 8 (eight) hours as  needed for muscle spasms. 60 tablet 5  ? traMADol (ULTRAM) 50 MG tablet Take 1 tablet (50 mg total) by mouth every 6 (six) hours as needed. 120 tablet 5  ? triamcinolone ointment (KENALOG) 0.1 % Apply 1 application topically 2 (two) times daily.    ? benzonatate (TESSALON) 100 MG capsule Take 1 capsule (100 mg total) by mouth 3 (three) times daily as needed for cough. (Patient not taking: Reported on 11/16/2021) 30 capsule 0  ? doxycycline (VIBRAMYCIN) 100 MG capsule Take 100 mg by mouth 2 (two) times daily. (Patient not taking: Reported on 11/16/2021)    ? predniSONE (DELTASONE) 10 MG tablet 6 tablets on Day 1 , then reduce by 1 tablet daily until gone (Patient not  taking: Reported on 11/16/2021) 21 tablet 0  ? ?No facility-administered medications prior to visit.  ? ? ?Review of Systems; ? ?Patient denies headache, fevers, malaise, unintentional weight loss, skin rash, eye pain, sinus congestion and sinus pain, sore throat, dysphagia,  hemoptysis , cough, dyspnea, wheezing, chest pain, palpitations, orthopnea, edema, abdominal pain, nausea, melena, diarrhea, constipation, flank pain, dysuria, hematuria, urinary  Frequency, nocturia, numbness, tingling, seizures,  Focal weakness, Loss of consciousness,  Tremor, insomnia, depression, anxiety, and suicidal ideation.   ? ? ? ?Objective:  ?BP 114/60 (BP Location: Left Arm, Patient Position: Sitting, Cuff Size: Large)   Pulse 87   Temp 98.2 ?F (36.8 ?C) (Oral)   Ht _0  (1.753 m)   Wt 225 lb (102.1 kg)   SpO2 96%   BMI 33.23 kg/m?  ? ?BP Readings from Last 3 Encounters:  ?11/16/21 114/60  ?07/29/21 132/76  ?05/18/21 (!) 142/78  ? ? ?Wt Readings from Last 3 Encounters:  ?11/16/21 225 lb (102.1 kg)  ?07/29/21 218 lb (98.9 kg)  ?05/18/21 223 lb 9.6 oz (101.4 kg)  ? ? ?General appearance: alert, cooperative and appears stated age ?Ears: normal TM's and external ear canals both ears ?Throat: lips, mucosa, and tongue normal; teeth and gums normal ?Neck: no adenopathy, no carotid bruit, supple, symmetrical, trachea midline and thyroid not enlarged, symmetric, no tenderness/mass/nodules ?Back: symmetric, no curvature. ROM normal. No CVA tenderness. ?Lungs: clear to auscultation bilaterally ?Heart: regular rate and rhythm, S1, S2 normal, no murmur, click, rub or gallop ?Abdomen: soft, non-tender; bowel sounds normal; no masses,  no organomegaly ?Pulses: 2+ and symmetric ?Skin: Skin color, texture, turgor normal. No rashes or lesions ?Lymph nodes: Cervical, supraclavicular, and axillary nodes normal. ? ?Lab Results  ?Component Value Date  ? HGBA1C 5.7 03/16/2021  ? HGBA1C 5.3 12/03/2019  ? ? ?Lab Results  ?Component Value Date  ?  CREATININE 1.09 05/07/2021  ? CREATININE 0.91 03/16/2021  ? CREATININE 0.90 07/21/2020  ? ? ?Lab Results  ?Component Value Date  ? WBC 9.1 05/07/2021  ? HGB 13.7 05/07/2021  ? HCT 40.0 05/07/2021  ? PLT 233 05/07/2021  ? GLUCOSE 133 (H) 05/07/2021  ? CHOL 125 03/16/2021  ? TRIG 149 03/16/2021  ? HDL 40 03/16/2021  ? LDLDIRECT 146.0 07/28/2015  ? [place] 59 03/16/2021  ? ALT 29 05/07/2021  ? AST 20 05/07/2021  ? NA 138 05/07/2021  ? K 3.3 (L) 05/07/2021  ? CL 105 05/07/2021  ? CREATININE 1.09 05/07/2021  ? BUN 17 05/07/2021  ? CO2 24 05/07/2021  ? TSH 0.88 07/21/2020  ? PSA 0.5 09/24/2019  ? HGBA1C 5.7 03/16/2021  ? MICROALBUR <0.7 12/03/2019  ? ? ?No results found. ? ?Assessment & Plan:  ? ?Problem List Items Addressed  This Visit   ? ? Obesity (BMI 30.0-34.9)  ?  He was unable to start mounjaro due to cost.  rx for [facility] sent to [facility]  encouraged to increase days of exercise  ?  ?  ? Mixed hyperlipidemia  ? Relevant Orders  ? Lipid Profile  ? Hypogonadism male  ?  Checking testosterone level today  ?  ?  ? Glucosuria with normal serum glucose  ? Relevant Orders  ? HgB A1c  ? Fatty liver  ? Relevant Orders  ? Hepatic function panel  ? Fatigue  ?  Etiology unclear.  Will screen for thyroid, anemia,  hepatic and renal insufficiency, encourage regular exercise 5 days /week,  consider sleep study if  Snoring noted or exertional dyspnea reported ?  ?  ? Relevant Orders  ? CBC w/Diff  ? Essential hypertension - Primary  ?  Well controlled on current regimen of losartan/hct 100/25. Renal function stable, no changes today. ?  ?  ? Relevant Orders  ? Comp Met (CMET)  ? Colon cancer screening  ?  He is overdue to screening ,  history of TA removed in 2017 .  Referral to Dr Collene Mares in progress  ?  ?  ? Relevant Orders  ? Ambulatory referral to Gastroenterology  ? B12 deficiency  ?  Has not been taking supplement ?  ?  ? Relevant Orders  ? Vitamin B12  ? Aortic atherosclerosis ([place])  ?  Reviewed findings of prior CT scan today..   Patient is tolerating 5 mg low dose of crestor,  Which was added to zetia  ?  ?  ? ?Other Visit Diagnoses   ? ? Glucosuria      ? Relevant Orders  ? Urinalysis, Routine w reflex microscopic  ? Lack of libido      ? Relev

## 2021-11-16 NOTE — Assessment & Plan Note (Signed)
Checking testosterone level today  ?

## 2021-11-17 ENCOUNTER — Telehealth: Payer: Self-pay | Admitting: Internal Medicine

## 2021-11-17 ENCOUNTER — Other Ambulatory Visit: Payer: Self-pay

## 2021-11-17 MED ORDER — LOSARTAN POTASSIUM-HCTZ 100-12.5 MG PO TABS: 1.0000 | ORAL_TABLET | Freq: Every day | ORAL | 0 refills | Status: DC

## 2021-11-17 MED ORDER — LOSARTAN POTASSIUM-HCTZ 100-12.5 MG PO TABS: 1.0000 | ORAL_TABLET | Freq: Every day | ORAL | 1 refills | Status: DC

## 2021-11-17 NOTE — Telephone Encounter (Signed)
Patient is requesting the following refills; losartan-hydrochlorothiazide (HYZAAR) 100-12.5 MG tablet. Please send a 30 day supply to [facility] on [address] and a 90 Day to mail order pharmacy.  ?

## 2021-11-17 NOTE — Telephone Encounter (Signed)
Medication has been refilled both as a 30 and 90 day supply as requested by pt.  ?

## 2021-11-19 LAB — TESTOSTERONE, FREE: TESTOSTERONE FREE: 60.1 pg/mL (ref 46.0–224.0)

## 2021-11-19 LAB — SEX HORMONE BINDING GLOBULIN: Sex Hormone Binding: 19 nmol/L (ref 10–50)

## 2021-11-22 LAB — TESTOSTERONE, % FREE: Testosterone-% Free: 1.9 %

## 2021-11-24 ENCOUNTER — Other Ambulatory Visit: Payer: Self-pay | Admitting: Gastroenterology

## 2021-11-24 DIAGNOSIS — R1011 Right upper quadrant pain: Secondary | ICD-10-CM

## 2021-11-30 ENCOUNTER — Other Ambulatory Visit: Payer: Self-pay

## 2021-12-14 ENCOUNTER — Inpatient Hospital Stay: Admission: RE | Admit: 2021-12-14 | Payer: BC Managed Care – PPO | Source: Ambulatory Visit

## 2021-12-15 ENCOUNTER — Other Ambulatory Visit: Payer: Self-pay | Admitting: Internal Medicine

## 2021-12-17 ENCOUNTER — Encounter: Payer: Self-pay | Admitting: Internal Medicine

## 2021-12-17 ENCOUNTER — Ambulatory Visit: Payer: BC Managed Care – PPO | Admitting: Internal Medicine

## 2021-12-17 VITALS — BP 122/76 | HR 76 | Temp 98.1°F | Ht 68.0 in | Wt 224.8 lb

## 2021-12-17 DIAGNOSIS — Z125 Encounter for screening for malignant neoplasm of prostate: Secondary | ICD-10-CM | POA: Diagnosis not present

## 2021-12-17 DIAGNOSIS — Z79899 Other long term (current) drug therapy: Secondary | ICD-10-CM

## 2021-12-17 DIAGNOSIS — E291 Testicular hypofunction: Secondary | ICD-10-CM | POA: Diagnosis not present

## 2021-12-17 DIAGNOSIS — E669 Obesity, unspecified: Secondary | ICD-10-CM

## 2021-12-17 MED ORDER — "SYRINGE 25G X 1"" 3 ML MISC": 0 refills | Status: DC

## 2021-12-17 MED ORDER — TESTOSTERONE CYPIONATE 200 MG/ML IM SOLN: 100.0000 mg | INTRAMUSCULAR | 0 refills | Status: DC

## 2021-12-17 NOTE — Progress Notes (Signed)
? ?Subjective:  ?Patient ID: Ronald Cordova, male    DOB: 03-29-1966  Age: 56 y.o. MRN: [id number] ? ?CC: The primary encounter diagnosis was Prostate cancer screening. Diagnoses of Long-term use of high-risk medication, Hypogonadism male, and Obesity (BMI 30.0-34.9) were also pertinent to this visit. ? ? ?This visit occurred during the SARS-CoV-2 public health emergency.  Safety protocols were in place, including screening questions prior to the visit, additional usage of staff PPE, and extensive cleaning of exam room while observing appropriate contact time as indicated for disinfecting solutions.   ? ?HPI ?Ronald Cordova presents for follow up on low testosterone levels and obesity ?Chief Complaint  ?Patient presents with  ? discuss testosterone  ? ? ?Patient is a 56 yr old male who presented recently with concerns about having low testosterone due to chronic fatigue  low energy and decreased sexual libido.  He presents today to review the results of his testing and to discuss whether to treat the low level or not.  He would like to start supplementation after reviewing the risks and benefits  ? ? ?2) obesity:  he has had difficulty losing weight due to increased appetite and was unable to start [facility]  .  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 ?Outpatient Medications Prior to Visit  ?Medication Sig Dispense Refill  ? albuterol (VENTOLIN HFA) 108 (90 Base) MCG/ACT inhaler Inhale 2 puffs into the lungs every 6 (six) hours as needed for wheezing or shortness of breath. 8 g 2  ? clonazePAM (KLONOPIN) 1 MG tablet TAKE 1 TABLET (1 MG TOTAL) BY MOUTH DAILY. AS NEEDED FOR ANXIETY 30 tablet 1  ? doxazosin (CARDURA) 2 MG tablet TAKE 1 TABLET DAILY 90 tablet 1  ? ezetimibe (ZETIA) 10 MG tablet TAKE 1 TABLET DAILY 90 tablet 2  ? fluticasone (FLONASE) 50 MCG/ACT nasal spray Place 2 sprays into both nostrils daily. 16 g 0  ? lansoprazole (PREVACID) 30 MG capsule Take 1  capsule (30 mg total) by mouth daily at 12 noon. 30 capsule 5  ? losartan-hydrochlorothiazide (HYZAAR) 100-12.5 MG tablet TAKE 1 TABLET BY MOUTH EVERY DAY 30 tablet 5  ? montelukast (SINGULAIR) 10 MG tablet Take 10 mg by mouth at bedtime.    ? nystatin cream (MYCOSTATIN) Apply to affected area 2 times daily 30 g 0  ? ondansetron (ZOFRAN ODT) 4 MG disintegrating tablet Take 1 tablet (4 mg total) by mouth every 8 (eight) hours as needed for nausea or vomiting. 20 tablet 0  ? RETIN-A 0.01 % gel     ? rosuvastatin (CRESTOR) 5 MG tablet TAKE 1 TABLET DAILY 90 tablet 1  ? Semaglutide-Weight Management 0.25 MG/0.5ML SOAJ Inject 0.25 mg into the skin once a week. 2 mL 2  ? sildenafil (REVATIO) 20 MG tablet TAKE 3 TO 5 TABLETS BY MOUTH DAILY AS NEEDED 90 tablet 0  ? Spacer/Aero-Holding Chambers (AEROCHAMBER PLUS) inhaler Use with inhaler 1 each 2  ? tirzepatide (MOUNJARO) 2.5 MG/0.5ML Pen Inject 2.5 mg into the skin once a week. 2 mL 2  ? tiZANidine (ZANAFLEX) 4 MG tablet Take 1 tablet (4 mg total) by mouth every 8 (eight) hours as needed for muscle spasms. 60 tablet 5  ? traMADol (ULTRAM) 50 MG tablet Take 1 tablet (50 mg total) by mouth every 6 (six) hours as needed. 120 tablet 5  ? triamcinolone ointment (KENALOG) 0.1 % Apply 1 application topically 2 (two) times daily.    ? ?  No facility-administered medications prior to visit.  ? ? ?Review of Systems; ? ?Patient denies headache, fevers, malaise, unintentional weight loss, skin rash, eye pain, sinus congestion and sinus pain, sore throat, dysphagia,  hemoptysis , cough, dyspnea, wheezing, chest pain, palpitations, orthopnea, edema, abdominal pain, nausea, melena, diarrhea, constipation, flank pain, dysuria, hematuria, urinary  Frequency, nocturia, numbness, tingling, seizures,  Focal weakness, Loss of consciousness,  Tremor, insomnia, depression, anxiety, and suicidal ideation.   ? ? ? ?Objective:  ?BP 122/76 (BP Location: Left Arm, Patient Position: Sitting, Cuff Size:  Normal)   Pulse 76   Temp 98.1 ?F (36.7 ?C) (Oral)   Ht 5\' 8"  (1.727 m)   Wt 224 lb 12.8 oz (102 kg)   SpO2 96%   BMI 34.18 kg/m?  ? ?BP Readings from Last 3 Encounters:  ?12/17/21 122/76  ?11/16/21 114/60  ?07/29/21 132/76  ? ? ?Wt Readings from Last 3 Encounters:  ?12/17/21 224 lb 12.8 oz (102 kg)  ?11/16/21 225 lb (102.1 kg)  ?07/29/21 218 lb (98.9 kg)  ? ? ?General appearance: alert, cooperative and appears stated age ?Ears: normal TM's and external ear canals both ears ?Throat: lips, mucosa, and tongue normal; teeth and gums normal ?Neck: no adenopathy, no carotid bruit, supple, symmetrical, trachea midline and thyroid not enlarged, symmetric, no tenderness/mass/nodules ?Back: symmetric, no curvature. ROM normal. No CVA tenderness. ?Lungs: clear to auscultation bilaterally ?Heart: regular rate and rhythm, S1, S2 normal, no murmur, click, rub or gallop ?Abdomen: soft, non-tender; bowel sounds normal; no masses,  no organomegaly ?Pulses: 2+ and symmetric ?Skin: Skin color, texture, turgor normal. No rashes or lesions ?Lymph nodes: Cervical, supraclavicular, and axillary nodes normal. ? ?Lab Results  ?Component Value Date  ? HGBA1C 5.7 11/16/2021  ? HGBA1C 5.7 03/16/2021  ? HGBA1C 5.3 12/03/2019  ? ? ?Lab Results  ?Component Value Date  ? CREATININE 0.90 11/16/2021  ? CREATININE 1.09 05/07/2021  ? CREATININE 0.91 03/16/2021  ? ? ?Lab Results  ?Component Value Date  ? WBC 6.5 11/16/2021  ? HGB 15.5 11/16/2021  ? HCT 46.5 11/16/2021  ? PLT 251.0 11/16/2021  ? GLUCOSE 93 11/16/2021  ? CHOL 126 11/16/2021  ? TRIG 121.0 11/16/2021  ? HDL 35.40 (L) 11/16/2021  ? LDLDIRECT 146.0 07/28/2015  ? [place] 66 11/16/2021  ? ALT 33 11/16/2021  ? ALT 33 11/16/2021  ? AST 19 11/16/2021  ? AST 19 11/16/2021  ? NA 140 11/16/2021  ? K 4.0 11/16/2021  ? CL 104 11/16/2021  ? CREATININE 0.90 11/16/2021  ? BUN 13 11/16/2021  ? CO2 30 11/16/2021  ? TSH 0.88 07/21/2020  ? PSA 0.5 09/24/2019  ? HGBA1C 5.7 11/16/2021  ? MICROALBUR <0.7  12/03/2019  ? ? ?No results found. ? ?Assessment & Plan:  ? ?Problem List Items Addressed This Visit   ? ? Obesity (BMI 30.0-34.9)  ?  I have addressed  BMI and recommended the Optavia diet as it has a more favorable effect on lipids than theKETO diet  And utilizes smaller more frequent meals to increase metabolism.  I have also recommended that patient start exercising with a goal of 30 minutes of aerobic exercise a minimum of 5 days per week. Screening for lipid disorders, thyroid and diabetes have been done  done today. ? ? ? ?  ?  ? Hypogonadism male  ?  The risks and benefits of testosterone supplementation were discussed , with the goal of therapy to normalize his level. More frequent su. vveillance of other labs  is needed  He requests parenteral supplementation and has a friend who will administer the medication .  He will return to have his level checked a minimum of 6 weeks after starting supplementation,  One week prior to injection (he injects every 2 weeks).  Fasting labs needed and ordered .  ? ?  ?  ? ?Other Visit Diagnoses   ? ? Prostate cancer screening    -  Primary  ? Relevant Orders  ? PSA  ? Long-term use of high-risk medication      ? Relevant Orders  ? CBC with Differential/Platelet  ? Testos,Total,Free and SHBG (Male)  ? Comprehensive metabolic panel  ? Lipid panel  ? ?  ? ? ?I spent a total of  28   minutes with this patient in a face to face visit on the date of this encounter reviewing  his most recent  weight loss attempts,  various commercial diets he has been considering ,  the risks and benefits of testosterone replacement ,  and post visit ordering of testing and therapeutics.   ? ?Follow-up: No follow-ups on file. ? ? ?Crecencio Mc, MD ?

## 2021-12-17 NOTE — Patient Instructions (Addendum)
Optavia diet is much better for your cholesterol than the KETO diet  because of its effect on cholesterol ? ?Regarding testosterone replacememt:  Start with 100 mg dose every 14 days ? ? ?Let's repeat your testosterone after 3 doses ,  midway between shots. (About 7 weeks from now ).  We will check cholesterol,  hgb and PSA as well   so come fasting ? ? ?Lab Results  ?Component Value Date  ? PSA 0.5 09/24/2019  ? PSA 0.51 08/28/2018  ? PSA 0.69 02/28/2017  ? ? ? ? ? ? ? ? ? ?

## 2021-12-18 NOTE — Progress Notes (Signed)
Cardiology Office Note ? ?Date:  12/21/2021  ? ?ID:  Ronald Cordova, DOB May 27, 1966, MRN [id number] ? ?PCP:  Ronald Mc, MD  ? ?Chief Complaint  ?Patient presents with  ? 12 month follow up   ?  Patient c/o shortness of breath. Medications reviewed by the patient verbally.   ? ? ?HPI:  ?Ronald Cordova is a 56 y.o. male with past medical history of ?chest pain  ?shortness of breath ?Hypertension ?Hyperlipidemia ?Palpitations ?Anxiety ?family history: brother died suddenly and was told it was from a massive heart attack, at age of 31,  ?Previously seen by Dr. Farrel Conners in 2017 ?GAD managed with clonazepam   ?chronic musculoskeletal pain  ?Calcium score in 2019: 14 ?Low testosterone ?Presents for f/u of his cardiac risk factors/coronary calcification ? ?Last seen in clinic by myself March 2022 ? 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 ?no regular exercise program,  ?Continues to struggle with his weight ?Denies any anginal symptoms, no shortness of breath on exertion ? ?Prior smoking history, current non-smoker ? ?Lab work reviewed ?A1C 5.7 ?Total chol 129, LDL 66, tolerating Zetia and Crestor 5 daily ?Testosterone 60, received a testosterone shot yesterday ? ?EKG personally reviewed by myself on todays visit ? shows  normal sinus rhythm rate 99 bpm no significant ST or T wave changes ? ?Other past medical history reviewed ?emergency room July 2018 for chest pain abdominal tenderness ? ? CT scan chest July 2013 ?Renal CT July 2018 ?Previous stress test June 2017 no ischemia ? ?CT scan ?Renal CT scan and chest CT 2013 ?No coronary calcifications noted ?He does have mild distal descending aorta and mild common iliac calcification ? ?PMH:   has a past medical history of Anxiety state, unspecified, Chest pain with moderate risk for cardiac etiology (01/21/2012), Community acquired  pneumonia (95/01/2129), Eosinophilic esophagitis (8657), GERD (gastroesophageal reflux disease), Hemorrhoids, Hypertension, Irritable bowel syndrome, Polyp of colon, hyperplastic (01/08/2011), and Treadmill stress test negative for angina pectoris (2003). ? ?PSH:    ?Past Surgical History:  ?Procedure Laterality Date  ? APPENDECTOMY    ? done during high school  ? carpal tunnel release  Sept 2008  ? R hand, by  [place], [place]   ? JOINT REPLACEMENT    ? KNEE ARTHROSCOPY  2000  ? left  ? NASAL SINUS SURGERY  1998  ? bilateral  ? ? ?Current Outpatient Medications  ?Medication Sig Dispense Refill  ? albuterol (VENTOLIN HFA) 108 (90 Base) MCG/ACT inhaler Inhale 2 puffs into the lungs every 6 (six) hours as needed for wheezing or shortness of breath. 8 g 2  ? clonazePAM (KLONOPIN) 1 MG tablet TAKE 1 TABLET (1 MG TOTAL) BY MOUTH DAILY. AS NEEDED FOR ANXIETY 30 tablet 1  ? doxazosin (CARDURA) 2 MG tablet TAKE 1 TABLET DAILY 90 tablet 1  ? ezetimibe (ZETIA) 10 MG tablet TAKE 1 TABLET DAILY 90 tablet 2  ? fluticasone (FLONASE) 50 MCG/ACT nasal spray Place 2 sprays into both nostrils daily. 16 g 0  ? lansoprazole (PREVACID) 30 MG capsule Take 1 capsule (30 mg total) by mouth daily at 12 noon. 30 capsule 5  ? losartan-hydrochlorothiazide (HYZAAR) 100-12.5 MG tablet TAKE 1 TABLET BY MOUTH EVERY DAY 30 tablet 5  ? montelukast (SINGULAIR) 10 MG tablet Take 10 mg by mouth at bedtime.    ?  nystatin cream (MYCOSTATIN) Apply to affected area 2 times daily 30 g 0  ? ondansetron (ZOFRAN ODT) 4 MG disintegrating tablet Take 1 tablet (4 mg total) by mouth every 8 (eight) hours as needed for nausea or vomiting. 20 tablet 0  ? RETIN-A 0.01 % gel     ? rosuvastatin (CRESTOR) 5 MG tablet TAKE 1 TABLET DAILY 90 tablet 1  ? Semaglutide-Weight Management 0.25 MG/0.5ML SOAJ Inject 0.25 mg into the skin once a week. 2 mL 2  ? sildenafil (REVATIO) 20 MG tablet TAKE 3 TO 5 TABLETS BY MOUTH DAILY AS NEEDED 90 tablet 0  ? Spacer/Aero-Holding Chambers  (AEROCHAMBER PLUS) inhaler Use with inhaler 1 each 2  ? Syringe/Needle, Disp, (SYRINGE 3CC/25GX1") 25G X 1" 3 ML MISC Use for b12 injections 50 each 0  ? testosterone cypionate (DEPOTESTOSTERONE CYPIONATE) 200 MG/ML injection Inject 0.5 mLs (100 mg total) into the muscle every 14 (fourteen) days. 10 mL 0  ? tirzepatide (MOUNJARO) 2.5 MG/0.5ML Pen Inject 2.5 mg into the skin once a week. 2 mL 2  ? tiZANidine (ZANAFLEX) 4 MG tablet Take 1 tablet (4 mg total) by mouth every 8 (eight) hours as needed for muscle spasms. 60 tablet 5  ? traMADol (ULTRAM) 50 MG tablet Take 1 tablet (50 mg total) by mouth every 6 (six) hours as needed. 120 tablet 5  ? triamcinolone ointment (KENALOG) 0.1 % Apply 1 application topically 2 (two) times daily.    ? ?No current facility-administered medications for this visit.  ? ? ? ?Allergies:   Penicillin g and Penicillins  ? ?Social History:  The patient  reports that he quit smoking about 10 years ago. His smoking use included cigarettes. He has a 15.00 pack-year smoking history. He has never used smokeless tobacco. He reports current alcohol use. He reports that he does not use drugs.  ? ?Family History:   family history includes BRCA 1/2 in his maternal aunt; Heart attack (age of onset: 67) in his brother; Hyperlipidemia in his brother, father, mother, and sister; Hypertension in his father and mother.  ? ? ?Review of Systems: ?Review of Systems  ?Constitutional: Negative.   ?Respiratory: Negative.    ?Cardiovascular: Negative.   ?Gastrointestinal: Negative.   ?Musculoskeletal: Negative.   ?Neurological: Negative.   ?Psychiatric/Behavioral: Negative.    ?All other systems reviewed and are negative. ? ? ?PHYSICAL EXAM: ?VS:  BP 120/60 (BP Location: Left Arm, Patient Position: Sitting, Cuff Size: Normal)   Pulse 99   Ht '5\' 9"'  (1.753 m)   Wt 225 lb 6 oz (102.2 kg)   SpO2 98%   BMI 33.28 kg/m?  , BMI Body mass index is 33.28 kg/m?Marland Kitchen ?Constitutional:  oriented to person, place, and time. No  distress.  ?HENT:  ?Head: Grossly normal ?Eyes:  no discharge. No scleral icterus.  ?Neck: No JVD, no carotid bruits  ?Cardiovascular: Regular rate and rhythm, no murmurs appreciated ?Pulmonary/Chest: Clear to auscultation bilaterally, no wheezes or rails ?Abdominal: Soft.  no distension.  no tenderness.  ?Musculoskeletal: Normal range of motion ?Neurological:  normal muscle tone. Coordination normal. No atrophy ?Skin: Skin warm and dry ?Psychiatric: normal affect, pleasant ? ?Recent Labs: ?11/16/2021: ALT 33; ALT 33; BUN 13; Creatinine, Ser 0.90; Hemoglobin 15.5; Platelets 251.0; Potassium 4.0; Sodium 140  ? ? ?Lipid Panel ?Lab Results  ?Component Value Date  ? CHOL 126 11/16/2021  ? HDL 35.40 (L) 11/16/2021  ? [place] 66 11/16/2021  ? TRIG 121.0 11/16/2021  ? ?  ? ?Wt Readings  from Last 3 Encounters:  ?12/21/21 225 lb 6 oz (102.2 kg)  ?12/17/21 224 lb 12.8 oz (102 kg)  ?11/16/21 225 lb (102.1 kg)  ?  ? ?ASSESSMENT AND PLAN: ? ?Chest pain with moderate risk for cardiac etiology  ?Denies chest pain at this time on exertion, low calcium score 14, cholesterol at goal, diabetes numbers controlled, current non-smoker ?Recommend he call for any chest pain concerning for angina ? ?Pulmonary nodule ?First noted in 2013,  ?Not noted on repeat CT scan 2019 ? ?Mixed hyperlipidemia ?Crestor Zetia, cholesterol at goal ? ?Essential hypertension ?Blood pressure is well controlled on today's visit. No changes made to the medications. ? ?Palpitations ?No significant symptoms ?No further work-up needed ? ?Shortness of breath ?Continue weight loss program, ?Suggested walking program ?Discussed low carbohydrate diet ? ?Aortic atherosclerosis ?Seen on CT scan, mild ?LDL at goal ? ? Total encounter time more than 30 minutes ? Greater than 50% was spent in counseling and coordination of care with the patient ? ? ? ? ?Orders Placed This Encounter  ?Procedures  ? EKG 12-Lead  ? ? ? ?Signed, ?Esmond Plants, M.D., Ph.D. ?12/21/2021  ?[place] ?[phone] ? ?

## 2021-12-20 NOTE — Assessment & Plan Note (Signed)
I have addressed  BMI and recommended the [place]avia diet as it has a more favorable effect on lipids than theKETO diet  And utilizes smaller more frequent meals to increase metabolism.  I have also recommended that patient start exercising with a goal of 30 minutes of aerobic exercise a minimum of 5 days per week. Screening for lipid disorders, thyroid and diabetes have been done  done today. ? ? ?

## 2021-12-20 NOTE — Assessment & Plan Note (Signed)
The risks and benefits of testosterone supplementation were discussed , with the goal of therapy to normalize his level. More frequent su. vveillance of other labs is needed  He requests parenteral supplementation and has a friend who will administer the medication .  He will return to have his level checked a minimum of 6 weeks after starting supplementation,  One week prior to injection (he injects every 2 weeks).  Fasting labs needed and ordered .  ?

## 2021-12-21 ENCOUNTER — Ambulatory Visit: Payer: BC Managed Care – PPO | Admitting: Cardiovascular Disease

## 2021-12-21 ENCOUNTER — Encounter: Payer: Self-pay | Admitting: Cardiovascular Disease

## 2021-12-21 VITALS — BP 120/60 | HR 99 | Ht 69.0 in | Wt 225.4 lb

## 2021-12-21 DIAGNOSIS — I700 Atherosclerosis of aorta: Secondary | ICD-10-CM | POA: Diagnosis not present

## 2021-12-21 DIAGNOSIS — F17201 Nicotine dependence, unspecified, in remission: Secondary | ICD-10-CM | POA: Diagnosis not present

## 2021-12-21 DIAGNOSIS — I10 Essential (primary) hypertension: Secondary | ICD-10-CM

## 2021-12-21 DIAGNOSIS — M130 Polyarthritis, unspecified: Secondary | ICD-10-CM

## 2021-12-21 DIAGNOSIS — E782 Mixed hyperlipidemia: Secondary | ICD-10-CM

## 2021-12-21 NOTE — Patient Instructions (Signed)
Medication Instructions:  No changes  If you need a refill on your cardiac medications before your next appointment, please call your pharmacy.   Lab work: No new labs needed  Testing/Procedures: No new testing needed  Follow-Up: At [facility], you and your health needs are our priority.  As part of our continuing mission to provide you with exceptional heart care, we have created designated Provider Care Teams.  These Care Teams include your primary Cardiologist (physician) and Advanced Practice Providers (APPs -  Physician Assistants and Nurse Practitioners) who all work together to provide you with the care you need, when you need it.  You will need a follow up appointment in 12 months  Providers on your designated Care Team:   Christopher Berge, NP Ryan Dunn, PA-C Cadence Furth, PA-C  COVID-19 Vaccine Information can be found at: [email or web address] For questions related to vaccine distribution or appointments, please email [email or web address] or call [phone].   

## 2021-12-22 ENCOUNTER — Telehealth: Payer: Self-pay

## 2021-12-22 MED ORDER — NYSTATIN 100000 UNIT/GM EX CREA: TOPICAL_CREAM | CUTANEOUS | 0 refills | Status: DC

## 2021-12-22 NOTE — Telephone Encounter (Signed)
Patient called to request a refill for his nystatin cream.   ? ?Patient states his preferred pharmacy is [facility] on the corner of [address]. ?

## 2021-12-22 NOTE — Telephone Encounter (Signed)
Spoke with pt to let him know that the medication has been refilled.  

## 2021-12-28 ENCOUNTER — Ambulatory Visit: Payer: BC Managed Care – PPO

## 2022-01-24 ENCOUNTER — Other Ambulatory Visit: Payer: Self-pay | Admitting: Internal Medicine

## 2022-01-24 ENCOUNTER — Other Ambulatory Visit: Payer: Self-pay | Admitting: Family

## 2022-02-01 NOTE — Telephone Encounter (Signed)
Pt need refill on clonazePAM sent to [facility]

## 2022-02-01 NOTE — Telephone Encounter (Signed)
Refilled: 09/21/2021 Last OV: 12/17/2021 Next OV: 02/15/2022

## 2022-02-03 ENCOUNTER — Other Ambulatory Visit: Payer: Self-pay | Admitting: Internal Medicine

## 2022-02-08 ENCOUNTER — Other Ambulatory Visit: Payer: BC Managed Care – PPO

## 2022-02-08 ENCOUNTER — Other Ambulatory Visit (INDEPENDENT_AMBULATORY_CARE_PROVIDER_SITE_OTHER): Payer: BC Managed Care – PPO

## 2022-02-08 DIAGNOSIS — R7989 Other specified abnormal findings of blood chemistry: Secondary | ICD-10-CM

## 2022-02-08 DIAGNOSIS — Z79899 Other long term (current) drug therapy: Secondary | ICD-10-CM | POA: Diagnosis not present

## 2022-02-08 DIAGNOSIS — Z125 Encounter for screening for malignant neoplasm of prostate: Secondary | ICD-10-CM | POA: Diagnosis not present

## 2022-02-08 DIAGNOSIS — E291 Testicular hypofunction: Secondary | ICD-10-CM

## 2022-02-09 ENCOUNTER — Other Ambulatory Visit: Payer: Self-pay | Admitting: Internal Medicine

## 2022-02-09 LAB — CBC WITH DIFFERENTIAL/PLATELET
Basophils Absolute: 0.1 10*3/uL (ref 0.0–0.1)
Basophils Relative: 0.7 % (ref 0.0–3.0)
Eosinophils Absolute: 0.4 10*3/uL (ref 0.0–0.7)
Eosinophils Relative: 5.4 % — ABNORMAL HIGH (ref 0.0–5.0)
HCT: 47.2 % (ref 39.0–52.0)
Hemoglobin: 15.6 g/dL (ref 13.0–17.0)
Lymphocytes Relative: 33.6 % (ref 12.0–46.0)
Lymphs Abs: 2.8 10*3/uL (ref 0.7–4.0)
MCHC: 33 g/dL (ref 30.0–36.0)
MCV: 94 fl (ref 78.0–100.0)
Monocytes Absolute: 0.7 10*3/uL (ref 0.1–1.0)
Monocytes Relative: 7.9 % (ref 3.0–12.0)
Neutro Abs: 4.3 10*3/uL (ref 1.4–7.7)
Neutrophils Relative %: 52.4 % (ref 43.0–77.0)
Platelets: 286 10*3/uL (ref 150.0–400.0)
RBC: 5.02 Mil/uL (ref 4.22–5.81)
RDW: 13.5 % (ref 11.5–15.5)
WBC: 8.3 10*3/uL (ref 4.0–10.5)

## 2022-02-09 LAB — PSA: PSA: 0.57 ng/mL (ref 0.10–4.00)

## 2022-02-09 LAB — COMPREHENSIVE METABOLIC PANEL
ALT: 35 U/L (ref 0–53)
AST: 18 U/L (ref 0–37)
Albumin: 4.6 g/dL (ref 3.5–5.2)
Alkaline Phosphatase: 56 U/L (ref 39–117)
BUN: 16 mg/dL (ref 6–23)
CO2: 28 mEq/L (ref 19–32)
Calcium: 10.1 mg/dL (ref 8.4–10.5)
Chloride: 106 mEq/L (ref 96–112)
Creatinine, Ser: 1.05 mg/dL (ref 0.40–1.50)
GFR: 80 mL/min (ref >60.00)
Glucose, Bld: 91 mg/dL (ref 70–99)
Potassium: 4.2 mEq/L (ref 3.5–5.1)
Sodium: 143 mEq/L (ref 135–145)
Total Bilirubin: 0.5 mg/dL (ref 0.2–1.2)
Total Protein: 7 g/dL (ref 6.0–8.3)

## 2022-02-09 LAB — LIPID PANEL
Cholesterol: 135 mg/dL (ref 0–200)
HDL: 35.1 mg/dL — ABNORMAL LOW (ref >39.00)
NonHDL: 100.2
Total CHOL/HDL Ratio: 4
Triglycerides: 246 mg/dL — ABNORMAL HIGH (ref 0.0–149.0)
VLDL: 49.2 mg/dL — ABNORMAL HIGH (ref 0.0–40.0)

## 2022-02-09 LAB — LDL CHOLESTEROL, DIRECT: Direct LDL: 83 mg/dL

## 2022-02-12 LAB — TESTOS,TOTAL,FREE AND SHBG (FEMALE)
Free Testosterone: 51.9 pg/mL (ref 35.0–155.0)
Sex Hormone Binding: 16 nmol/L (ref 10–50)
Testosterone, Total, LC-MS-MS: 220 ng/dL — ABNORMAL LOW (ref 250–1100)

## 2022-02-13 ENCOUNTER — Encounter: Payer: Self-pay | Admitting: Internal Medicine

## 2022-02-13 DIAGNOSIS — R7989 Other specified abnormal findings of blood chemistry: Secondary | ICD-10-CM | POA: Insufficient documentation

## 2022-02-13 NOTE — Assessment & Plan Note (Signed)
Repeat level was low after 6 weeks of supplementation using 0.5 ml (100 mg ) every 14 days. Dose will be increased to 0.75 mL (150 mg )

## 2022-02-13 NOTE — Assessment & Plan Note (Addendum)
Noted in April, Confirmed with repeat testing  In June/

## 2022-02-15 ENCOUNTER — Ambulatory Visit: Payer: BC Managed Care – PPO | Admitting: Internal Medicine

## 2022-02-15 DIAGNOSIS — E538 Deficiency of other specified B group vitamins: Secondary | ICD-10-CM | POA: Diagnosis not present

## 2022-02-15 DIAGNOSIS — E559 Vitamin D deficiency, unspecified: Secondary | ICD-10-CM | POA: Diagnosis not present

## 2022-02-15 DIAGNOSIS — F330 Major depressive disorder, recurrent, mild: Secondary | ICD-10-CM | POA: Diagnosis not present

## 2022-02-15 DIAGNOSIS — R7989 Other specified abnormal findings of blood chemistry: Secondary | ICD-10-CM

## 2022-02-15 DIAGNOSIS — Z8489 Family history of other specified conditions: Secondary | ICD-10-CM

## 2022-02-15 DIAGNOSIS — M2550 Pain in unspecified joint: Secondary | ICD-10-CM

## 2022-02-15 MED ORDER — "SYRINGE 22G X 1-1/2"" 3 ML MISC": 5 refills | Status: AC

## 2022-02-15 MED ORDER — TESTOSTERONE CYPIONATE 200 MG/ML IM SOLN: 150.0000 mg | INTRAMUSCULAR | 0 refills | Status: DC

## 2022-02-15 MED ORDER — SYRINGE 18G X 1-1/2" 3 ML MISC
0 refills | Status: DC
Start: 2022-02-15 — End: 2024-03-12

## 2022-02-15 NOTE — Progress Notes (Unsigned)
Subjective:  Patient ID: Ronald Cordova, male    DOB: May 31, 1966  Age: 56 y.o. MRN: [id number]  CC: There were no encounter diagnoses.   HPI Ronald Cordova presents for  Chief Complaint  Patient presents with   Follow-up    Follow up on hypertension, fatigue and lab results   1)  he was treated for infected maxillary sinus on the left  for a 3 month period spanning April-June with doxycycline  by endodontist  with no change in symptoms infection was seen on CT done by endodontist.  He has had multiple  root canals and crowns in the past 2 years.   Sinus surgery planned for August 1 after Dr Willeen Cass did a CT scan of sinuses showed persistent infection   2) Fatigue:  he hasa history of low B12 and has not supplemented in 2 months.  Does not tae Vitmain D either.  Taking Testosterone since May,   first post treatment level one midway between doses is still low.    3)   knees and feet ache at night .    4)  Obesity:  has not been exercisingg due to scheduled but has had a Job change recenlty ,  will be able to go to the gyn  3) Shoulder pain wants   Outpatient Medications Prior to Visit  Medication Sig Dispense Refill   albuterol (VENTOLIN HFA) 108 (90 Base) MCG/ACT inhaler Inhale 2 puffs into the lungs every 6 (six) hours as needed for wheezing or shortness of breath. 8 g 2   clonazePAM (KLONOPIN) 1 MG tablet TAKE 1 TABLET (1 MG TOTAL) BY MOUTH DAILY. AS NEEDED FOR ANXIETY 30 tablet 1   doxazosin (CARDURA) 2 MG tablet TAKE 1 TABLET DAILY 90 tablet 1   ezetimibe (ZETIA) 10 MG tablet TAKE 1 TABLET DAILY 90 tablet 2   fluticasone (FLONASE) 50 MCG/ACT nasal spray Place 2 sprays into both nostrils daily. 16 g 0   lansoprazole (PREVACID) 30 MG capsule Take 1 capsule (30 mg total) by mouth daily at 12 noon. 30 capsule 5   losartan-hydrochlorothiazide (HYZAAR) 100-12.5 MG tablet TAKE 1 TABLET BY MOUTH EVERY DAY 30 tablet 5   meloxicam (MOBIC) 7.5 MG tablet Take 7.5 mg by mouth 2 (two) times  daily.     montelukast (SINGULAIR) 10 MG tablet Take 10 mg by mouth at bedtime.     nystatin cream (MYCOSTATIN) Apply to affected area 2 times daily 30 g 0   ondansetron (ZOFRAN ODT) 4 MG disintegrating tablet Take 1 tablet (4 mg total) by mouth every 8 (eight) hours as needed for nausea or vomiting. 20 tablet 0   RETIN-A 0.01 % gel      rosuvastatin (CRESTOR) 5 MG tablet TAKE 1 TABLET DAILY 90 tablet 1   Semaglutide-Weight Management 0.25 MG/0.5ML SOAJ Inject 0.25 mg into the skin once a week. 2 mL 2   sildenafil (REVATIO) 20 MG tablet TAKE 3 TO 5 TABLETS BY MOUTH DAILY AS NEEDED 90 tablet 0   Spacer/Aero-Holding Chambers (AEROCHAMBER PLUS) inhaler Use with inhaler 1 each 2   Syringe/Needle, Disp, (SYRINGE 3CC/25GX1") 25G X 1" 3 ML MISC Use for b12 injections 50 each 0   testosterone cypionate (DEPOTESTOSTERONE CYPIONATE) 200 MG/ML injection Inject 0.5 mLs (100 mg total) into the muscle every 14 (fourteen) days. 10 mL 0   tirzepatide (MOUNJARO) 2.5 MG/0.5ML Pen Inject 2.5 mg into the skin once a week. 2 mL 2   tiZANidine (ZANAFLEX) 4 MG tablet  Take 1 tablet (4 mg total) by mouth every 8 (eight) hours as needed for muscle spasms. 60 tablet 5   traMADol (ULTRAM) 50 MG tablet TAKE 1 TABLET BY MOUTH EVERY 6 HOURS AS NEEDED. 120 tablet 5   triamcinolone ointment (KENALOG) 0.1 % Apply 1 application topically 2 (two) times daily.     No facility-administered medications prior to visit.    Review of Systems;  Patient denies headache, fevers, malaise, unintentional weight loss, skin rash, eye pain, sinus congestion and sinus pain, sore throat, dysphagia,  hemoptysis , cough, dyspnea, wheezing, chest pain, palpitations, orthopnea, edema, abdominal pain, nausea, melena, diarrhea, constipation, flank pain, dysuria, hematuria, urinary  Frequency, nocturia, numbness, tingling, seizures,  Focal weakness, Loss of consciousness,  Tremor, insomnia, depression, anxiety, and suicidal ideation.      Objective:   There were no vitals taken for this visit.  BP Readings from Last 3 Encounters:  12/21/21 120/60  12/17/21 122/76  11/16/21 114/60    Wt Readings from Last 3 Encounters:  12/21/21 225 lb 6 oz (102.2 kg)  12/17/21 224 lb 12.8 oz (102 kg)  11/16/21 225 lb (102.1 kg)    General appearance: alert, cooperative and appears stated age Ears: normal TM's and external ear canals both ears Throat: lips, mucosa, and tongue normal; teeth and gums normal Neck: no adenopathy, no carotid bruit, supple, symmetrical, trachea midline and thyroid not enlarged, symmetric, no tenderness/mass/nodules Back: symmetric, no curvature. ROM normal. No CVA tenderness. Lungs: clear to auscultation bilaterally Heart: regular rate and rhythm, S1, S2 normal, no murmur, click, rub or gallop Abdomen: soft, non-tender; bowel sounds normal; no masses,  no organomegaly Pulses: 2+ and symmetric Skin: Skin color, texture, turgor normal. No rashes or lesions Lymph nodes: Cervical, supraclavicular, and axillary nodes normal.  Lab Results  Component Value Date   HGBA1C 5.7 11/16/2021   HGBA1C 5.7 03/16/2021   HGBA1C 5.3 12/03/2019    Lab Results  Component Value Date   CREATININE 1.05 02/08/2022   CREATININE 0.90 11/16/2021   CREATININE 1.09 05/07/2021    Lab Results  Component Value Date   WBC 8.3 02/08/2022   HGB 15.6 02/08/2022   HCT 47.2 02/08/2022   PLT 286.0 02/08/2022   GLUCOSE 91 02/08/2022   CHOL 135 02/08/2022   TRIG 246.0 (H) 02/08/2022   HDL 35.10 (L) 02/08/2022   LDLDIRECT 83.0 02/08/2022   LDLCALC 66 11/16/2021   ALT 35 02/08/2022   AST 18 02/08/2022   NA 143 02/08/2022   K 4.2 02/08/2022   CL 106 02/08/2022   CREATININE 1.05 02/08/2022   BUN 16 02/08/2022   CO2 28 02/08/2022   TSH 0.88 07/21/2020   PSA 0.57 02/08/2022   HGBA1C 5.7 11/16/2021   MICROALBUR <0.7 12/03/2019    No results found.  Assessment & Plan:   Problem List Items Addressed This Visit   None   I spent a  total of   minutes with this patient in a face to face visit on the date of this encounter reviewing the last office visit with me on        ,  most recent with patient's cardiologist in    ,  patient'ss diet and eating habits, home blood pressure readings ,  most recent imaging study ,   and post visit ordering of testing and therapeutics.    Follow-up: No follow-ups on file.   Sherlene Shams, MD

## 2022-02-15 NOTE — Patient Instructions (Addendum)
Shoulder docs to consider  ( ?)  [facility] at [facility] (yes)   Franc[facility] Han[facility] [facility])  Dorthula Nettles Dew[facility] Conger , another group in [place] )      Increase testosterone dose  to 0.75  ml  (150 mg)  every 2 weeks  and recheck  level between 3rd and 4th dose if convenient

## 2022-02-16 NOTE — Assessment & Plan Note (Signed)
He has had repeatedly negative serologic screening for auotimmune/inflammatory arthridities.

## 2022-02-16 NOTE — Assessment & Plan Note (Signed)
His depressive symptoms have responded to wellbutrin and his anxiety is managed with prn clonazepam, not used while driving.  Medications discussed,  Risks and benefits of continued use outlined.  Refills given.

## 2022-02-16 NOTE — Assessment & Plan Note (Signed)
His first follow up level after starting parenteral supplementation with a dose of 100 mcg q 14 days was low.  Dose increased to 150 mcg (0.75 mL)    Biweekly advised,  And advised to return to have his level checked midway between dose 3 and 4.  Lab Results  Component Value Date   TESTOSTERONE 232.88 (L) 11/16/2021

## 2022-02-16 NOTE — Assessment & Plan Note (Signed)
Has not been taking supplement.  Repeat level slightly improved.   IF antibody pending .  he prefers parenteral supplementation for the energy imparted  Lab Results  Component Value Date   VITAMINB12 359 02/15/2022

## 2022-02-16 NOTE — Assessment & Plan Note (Addendum)
He has had cardiology follow up and noninvasive assessment in 2017 included myoview and ECHO. .  More recently with Dr Mariah Milling May 2023 included  EKG.  Given his cardiac CT  calcium score of 14. Continue zetia and statin .  Encourage weight loss and regulrar exercise.

## 2022-02-18 LAB — VITAMIN B12: Vitamin B-12: 359 pg/mL (ref 200–1100)

## 2022-02-18 LAB — VITAMIN D 25 HYDROXY (VIT D DEFICIENCY, FRACTURES): Vit D, 25-Hydroxy: 22 ng/mL — ABNORMAL LOW (ref 30–100)

## 2022-02-18 LAB — INTRINSIC FACTOR ANTIBODIES: Intrinsic Factor: NEGATIVE

## 2022-02-18 MED ORDER — ERGOCALCIFEROL 1.25 MG (50000 UT) PO CAPS: 50000.0000 [IU] | ORAL_CAPSULE | ORAL | 0 refills | Status: DC

## 2022-02-18 NOTE — Assessment & Plan Note (Signed)
Recurrent. . Megadose prescribed for weekly use x 3 months

## 2022-02-18 NOTE — Addendum Note (Signed)
Addended by: Sherlene Shams on: 02/18/2022 01:53 PM   Modules accepted: Orders

## 2022-03-09 ENCOUNTER — Encounter: Payer: Self-pay | Admitting: Otolaryngology

## 2022-03-11 NOTE — Progress Notes (Signed)
Confirmed that Lorin Picket is NOT taking GLP-1s.

## 2022-03-15 NOTE — Anesthesia Preprocedure Evaluation (Addendum)
Anesthesia Evaluation  Patient identified by MRN, date of birth, ID band Patient awake    Reviewed: Allergy & Precautions, NPO status , Patient's Chart, lab work & pertinent test results  History of Anesthesia Complications (+) PONV and history of anesthetic complications  Airway Mallampati: III  TM Distance: >3 FB Neck ROM: full    Dental no notable dental hx.    Pulmonary former smoker,  Long history of tobacco use  H/O [facility] PNA 05/2021   Pulmonary exam normal        Cardiovascular Exercise Tolerance: Good hypertension, Pt. on medications Normal cardiovascular exam     Neuro/Psych PSYCHIATRIC DISORDERS Anxiety negative neurological ROS     GI/Hepatic Neg liver ROS, GERD  Medicated and Controlled,  Endo/Other  negative endocrine ROS  Renal/GU      Musculoskeletal  (+) Arthritis , Chronic bilateral thoracic back pain Chronic right-sided low back pain without sciatica     Abdominal (+) + obese,   Peds  Hematology negative hematology ROS (+)   Anesthesia Other Findings Fatigue  Family history of sudden death in brother  Past Medical History: No date: Anxiety state, unspecified No date: Arthritis 01/21/2012: Chest pain with moderate risk for cardiac etiology     Comment:  Long history of tobacco use, now quit.   CT ordered 05/18/2021: Community acquired pneumonia No date: Dental crowns present 2010: Eosinophilic esophagitis     Comment:  by EGD with  biopsies No date: GERD (gastroesophageal reflux disease)     Comment:  carafate added by Dr. Loreta Ave No date: Hemorrhoids No date: Hypertension No date: Irritable bowel syndrome 01/08/2011: Polyp of colon, hyperplastic     Comment:  [name], repeat due 2018 No date: PONV (postoperative nausea and vomiting) No date: Shoulder pain, left 2003: Treadmill stress test negative for angina pectoris No date: Vertigo     Comment:  approx 2021  Past Surgical  History: No date: APPENDECTOMY     Comment:  done during high school Sept 2008: carpal tunnel release     Comment:  R hand, by  [place], [place]  No date: JOINT REPLACEMENT 2000: KNEE ARTHROSCOPY     Comment:  left 1998: NASAL SINUS SURGERY     Comment:  bilateral  BMI    Body Mass Index: 32.78 kg/m      Reproductive/Obstetrics negative OB ROS                            Anesthesia Physical Anesthesia Plan  ASA: 2  Anesthesia Plan: General ETT   Post-op Pain Management: Tylenol PO (pre-op) and Toradol IV (intra-op)   Induction: Intravenous  PONV Risk Score and Plan: 4 or greater and Ondansetron, Dexamethasone, Midazolam, Propofol infusion and Scopolamine patch - Pre-op  Airway Management Planned: Oral ETT  Additional Equipment:   Intra-op Plan:   Post-operative Plan: Extubation in OR  Informed Consent: I have reviewed the patients History and Physical, chart, labs and discussed the procedure including the risks, benefits and alternatives for the proposed anesthesia with the patient or authorized representative who has indicated his/her understanding and acceptance.     Dental Advisory Given  Plan Discussed with: Anesthesiologist, CRNA and Surgeon  Anesthesia Plan Comments:        Anesthesia Quick Evaluation

## 2022-03-15 NOTE — Discharge Instructions (Signed)
[facility] ENDOSCOPIC SINUS SURGERY [facility]  What is Functional Endoscopic Sinus Surgery?  The Surgery involves making the natural openings of the sinuses larger by removing the bony partitions that separate the sinuses from the nasal cavity.  The natural sinus lining is preserved as much as possible to allow the sinuses to resume normal function after the surgery.  In some patients nasal polyps (excessively swollen lining of the sinuses) may be removed to relieve obstruction of the sinus openings.  The surgery is performed through the nose using lighted scopes, which eliminates the need for incisions on the face.  A septoplasty is a different procedure which is sometimes performed with sinus surgery.  It involves straightening the boy partition that separates the two sides of your nose.  A crooked or deviated septum may need repair if is obstructing the sinuses or nasal airflow.  Turbinate reduction is also often performed during sinus surgery.  The turbinates are bony proturberances from the side walls of the nose which swell and can obstruct the nose in patients with sinus and allergy problems.  Their size can be surgically reduced to help relieve nasal obstruction.  What Can Sinus Surgery Do For Me?  Sinus surgery can reduce the frequency of sinus infections requiring antibiotic treatment.  This can provide improvement in nasal congestion, post-nasal drainage, facial pressure and nasal obstruction.  Surgery will NOT prevent you from ever having an infection again, so it usually only for patients who get infections 4 or more times yearly requiring antibiotics, or for infections that do not clear with antibiotics.  It will not cure nasal allergies, so patients with allergies may still require medication to treat their allergies after surgery. Surgery may improve headaches related to sinusitis, however, some people will continue to  require medication to control sinus headaches related to allergies.  Surgery will do nothing for other forms of headache (migraine, tension or cluster).  What Are the Risks of Endoscopic Sinus Surgery?  Current techniques allow surgery to be performed safely with little risk, however, there are rare complications that patients should be aware of.  Because the sinuses are located around the eyes, there is risk of eye injury, including blindness, though again, this would be quite rare. This is usually a result of bleeding behind the eye during surgery, which can effect vision, though there are treatments to protect the vision and prevent permanent injury. More serious complications would include bleeding inside the brain cavity or damage to the brain.This happens when the fluid around the brain leaks out into the sinus cavity.  Again, all of these complications are uncommon, and spinal fluid leaks can be safely managed surgically if they occur.  The most common complication of sinus surgery is bleeding from the nose, which may require packing or cauterization of the nose.  Patients with polyps may experience recurrence of the polyps that would require revision surgery.  Alterations of sense of smell or injury to the tear ducts are also rare complications.   What is the Surgery Like, and what is the Recovery?  The Surgery usually takes a couple of hours to perform, and is usually performed under a general anesthetic (completely asleep).  Patients are usually discharged home after a couple of hours.  Sometimes during surgery it is necessary to pack the nose to control bleeding, and the packing is left in place for 24 - 48 hours, and removed by your surgeon.  If   a septoplasty was performed during the procedure, there is often a splint placed which must be removed after 5-7 days.   Discomfort: Pain is usually mild to moderate, and can be controlled by prescription pain medication or acetaminophen (Tylenol).   Aspirin, Ibuprofen (Advil, Motrin), or Naprosyn (Aleve) should be avoided, as they can cause increased bleeding.  Most patients feel sinus pressure like they have a bad head cold for several days.  Sleeping with your head elevated can help reduce swelling and facial pressure, as can ice packs over the face.  A humidifier may be helpful to keep the mucous and blood from drying in the nose.   Diet: There are no specific diet restrictions, however, you should generally start with clear liquids and a light diet of bland foods because the anesthetic can cause some nausea.  Advance your diet depending on how your stomach feels.  Taking your pain medication with food will often help reduce stomach upset which pain medications can cause.  Nasal Saline Irrigation: It is important to remove blood clots and dried mucous from the nose as it is healing.  This is done by having you irrigate the nose at least 3 - 4 times daily with a salt water solution.  We recommend using NeilMed Sinus Rinse (available at the drug store).  Fill the squeeze bottle with the solution, bend over a sink, and insert the tip of the squeeze bottle into the nose  of an inch.  Point the tip of the squeeze bottle towards the inside corner of the eye on the same side your irrigating.  Squeeze the bottle and gently irrigate the nose.  If you bend forward as you do this, most of the fluid will flow back out of the nose, instead of down your throat.   The solution should be warm, near body temperature, when you irrigate.   Each time you irrigate, you should use a full squeeze bottle.   Note that if you are instructed to use Nasal Steroid Sprays at any time after your surgery, irrigate with saline BEFORE using the steroid spray, so you do not wash it all out of the nose. Another product, Nasal Saline Gel (such as AYR Nasal Saline Gel) can be applied in each nostril 3 - 4 times daily to moisture the nose and reduce scabbing or crusting.  Bleeding:   Bloody drainage from the nose can be expected for several days, and patients are instructed to irrigate their nose frequently with salt water to help remove mucous and blood clots.  The drainage may be dark red or brown, though some fresh blood may be seen intermittently, especially after irrigation.  Do not blow you nose, as bleeding may occur. If you must sneeze, keep your mouth open to allow air to escape through your mouth.  If heavy bleeding occurs: Irrigate the nose with saline to rinse out clots, then spray the nose 3 - 4 times with Afrin Nasal Decongestant Spray.  The spray will constrict the blood vessels to slow bleeding.  Pinch the lower half of your nose shut to apply pressure, and lay down with your head elevated.  Ice packs over the nose may help as well. If bleeding persists despite these measures, you should notify your doctor.  Do not use the Afrin routinely to control nasal congestion after surgery, as it can result in worsening congestion and may affect healing.     Activity: Return to work varies among patients. Most patients will be out   of work at least 5 - 7 days to recover.  Patient may return to work after they are off of narcotic pain medication, and feeling well enough to perform the functions of their job.  Patients must avoid heavy lifting (over 10 pounds) or strenuous physical for 2 weeks after surgery, so your employer may need to assign you to light duty, or keep you out of work longer if light duty is not possible.  NOTE: you should not drive, operate dangerous machinery, do any mentally demanding tasks or make any important legal or financial decisions while on narcotic pain medication and recovering from the general anesthetic.    Call Your Doctor Immediately if You Have Any of the Following: Bleeding that you cannot control with the above measures Loss of vision, double vision, bulging of the eye or black eyes. Fever over 101 degrees Neck stiffness with severe headache,  fever, nausea and change in mental state. You are always encouraged to call anytime with concerns, however, please call with requests for pain medication refills during office hours.  Office Endoscopy: During follow-up visits your doctor will remove any packing or splints that may have been placed and evaluate and clean your sinuses endoscopically.  Topical anesthetic will be used to make this as comfortable as possible, though you may want to take your pain medication prior to the visit.  How often this will need to be done varies from patient to patient.  After complete recovery from the surgery, you may need follow-up endoscopy from time to time, particularly if there is concern of recurrent infection or nasal polyps.  

## 2022-03-16 ENCOUNTER — Ambulatory Visit: Payer: BC Managed Care – PPO | Admitting: Anesthesiology

## 2022-03-16 ENCOUNTER — Encounter
Admission: RE | Disposition: A | Discharge status: Home / Self Care | Payer: Self-pay | Source: Home / Self Care | Attending: Otolaryngology

## 2022-03-16 ENCOUNTER — Other Ambulatory Visit: Payer: Self-pay

## 2022-03-16 ENCOUNTER — Ambulatory Visit
Admission: RE | Admit: 2022-03-16 | Discharge: 2022-03-16 | Disposition: A | Discharge status: Home / Self Care | Payer: BC Managed Care – PPO | Attending: Otolaryngology | Admitting: Otolaryngology

## 2022-03-16 ENCOUNTER — Encounter: Payer: Self-pay | Admitting: Otolaryngology

## 2022-03-16 DIAGNOSIS — Z86010 Personal history of colonic polyps: Secondary | ICD-10-CM | POA: Insufficient documentation

## 2022-03-16 DIAGNOSIS — E669 Obesity, unspecified: Secondary | ICD-10-CM | POA: Insufficient documentation

## 2022-03-16 DIAGNOSIS — Z96652 Presence of left artificial knee joint: Secondary | ICD-10-CM | POA: Diagnosis not present

## 2022-03-16 DIAGNOSIS — I10 Essential (primary) hypertension: Secondary | ICD-10-CM | POA: Diagnosis not present

## 2022-03-16 DIAGNOSIS — J329 Chronic sinusitis, unspecified: Secondary | ICD-10-CM | POA: Diagnosis not present

## 2022-03-16 DIAGNOSIS — Z87891 Personal history of nicotine dependence: Secondary | ICD-10-CM | POA: Diagnosis not present

## 2022-03-16 DIAGNOSIS — K219 Gastro-esophageal reflux disease without esophagitis: Secondary | ICD-10-CM | POA: Diagnosis not present

## 2022-03-16 DIAGNOSIS — Z6832 Body mass index (BMI) 32.0-32.9, adult: Secondary | ICD-10-CM | POA: Diagnosis not present

## 2022-03-16 HISTORY — PX: MAXILLARY ANTROSTOMY: SHX2003

## 2022-03-16 HISTORY — PX: SPHENOIDECTOMY: SHX2421

## 2022-03-16 HISTORY — DX: Other specified postprocedural states: R11.2

## 2022-03-16 HISTORY — DX: Dizziness and giddiness: R42

## 2022-03-16 HISTORY — DX: Dental restoration status: Z98.811

## 2022-03-16 HISTORY — PX: IMAGE GUIDED SINUS SURGERY: SHX6570

## 2022-03-16 HISTORY — DX: Other specified postprocedural states: Z98.890

## 2022-03-16 HISTORY — PX: FRONTAL SINUS EXPLORATION: SHX6591

## 2022-03-16 HISTORY — DX: Unspecified osteoarthritis, unspecified site: M19.90

## 2022-03-16 HISTORY — PX: ETHMOIDECTOMY: SHX5197

## 2022-03-16 HISTORY — DX: Pain in left shoulder: M25.512

## 2022-03-16 SURGERY — SINUS SURGERY, WITH IMAGING GUIDANCE
Anesthesia: General | Site: Nose

## 2022-03-16 MED ORDER — LIDOCAINE HCL (CARDIAC) PF 100 MG/5ML IV SOSY
PREFILLED_SYRINGE | INTRAVENOUS | Status: DC | PRN
  Administered 2022-03-16: 100 mg via INTRAVENOUS

## 2022-03-16 MED ORDER — LACTATED RINGERS IV SOLN: INTRAVENOUS | Status: DC

## 2022-03-16 MED ORDER — ROCURONIUM BROMIDE 100 MG/10ML IV SOLN
INTRAVENOUS | Status: DC | PRN
  Administered 2022-03-16: 50 mg via INTRAVENOUS
  Administered 2022-03-16 (×2): 10 mg via INTRAVENOUS

## 2022-03-16 MED ORDER — DEXAMETHASONE SODIUM PHOSPHATE 4 MG/ML IJ SOLN
INTRAMUSCULAR | Status: DC | PRN
  Administered 2022-03-16: 4 mg via INTRAVENOUS

## 2022-03-16 MED ORDER — SUGAMMADEX SODIUM 200 MG/2ML IV SOLN
INTRAVENOUS | Status: DC | PRN
  Administered 2022-03-16: 200 mg via INTRAVENOUS

## 2022-03-16 MED ORDER — OXYCODONE HCL 5 MG PO TABS: 5.0000 mg | ORAL_TABLET | Freq: Once | ORAL | Status: AC | PRN

## 2022-03-16 MED ORDER — OXYCODONE HCL 5 MG/5ML PO SOLN
5.0000 mg | Freq: Once | ORAL | Status: AC | PRN
  Administered 2022-03-16: 5 mg via ORAL

## 2022-03-16 MED ORDER — PROPOFOL 10 MG/ML IV BOLUS
INTRAVENOUS | Status: DC | PRN
  Administered 2022-03-16: 200 mg via INTRAVENOUS

## 2022-03-16 MED ORDER — MIDAZOLAM HCL 5 MG/5ML IJ SOLN
INTRAMUSCULAR | Status: DC | PRN
  Administered 2022-03-16: 2 mg via INTRAVENOUS

## 2022-03-16 MED ORDER — OXYMETAZOLINE HCL 0.05 % NA SOLN
NASAL | Status: DC | PRN
  Administered 2022-03-16: 1 via TOPICAL

## 2022-03-16 MED ORDER — FENTANYL CITRATE PF 50 MCG/ML IJ SOSY: 25.0000 ug | PREFILLED_SYRINGE | INTRAMUSCULAR | Status: DC | PRN

## 2022-03-16 MED ORDER — TRAMADOL 5 MG/ML ORAL SUSPENSION: 50.0000 mg | Freq: Once | ORAL | Status: DC | PRN

## 2022-03-16 MED ORDER — ACETAMINOPHEN 10 MG/ML IV SOLN
1000.0000 mg | Freq: Once | INTRAVENOUS | Status: AC
  Administered 2022-03-16: 1000 mg via INTRAVENOUS

## 2022-03-16 MED ORDER — PROMETHAZINE HCL 25 MG/ML IJ SOLN
6.2500 mg | INTRAMUSCULAR | Status: AC | PRN
  Administered 2022-03-16 (×2): 6.25 mg via INTRAVENOUS

## 2022-03-16 MED ORDER — FENTANYL CITRATE (PF) 100 MCG/2ML IJ SOLN
INTRAMUSCULAR | Status: DC | PRN
  Administered 2022-03-16 (×2): 50 ug via INTRAVENOUS

## 2022-03-16 MED ORDER — ACETAMINOPHEN 10 MG/ML IV SOLN: 1000.0000 mg | Freq: Four times a day (QID) | INTRAVENOUS | Status: DC

## 2022-03-16 MED ORDER — PREDNISONE 10 MG (21) PO TBPK: ORAL_TABLET | ORAL | 0 refills | Status: DC

## 2022-03-16 MED ORDER — LIDOCAINE-EPINEPHRINE 1 %-1:100000 IJ SOLN
INTRAMUSCULAR | Status: DC | PRN
  Administered 2022-03-16: 10 mL

## 2022-03-16 MED ORDER — ONDANSETRON HCL 4 MG/2ML IJ SOLN
INTRAMUSCULAR | Status: DC | PRN
  Administered 2022-03-16: 4 mg via INTRAVENOUS

## 2022-03-16 MED ORDER — ACETAMINOPHEN 500 MG PO TABS: 1000.0000 mg | ORAL_TABLET | Freq: Once | ORAL | Status: DC

## 2022-03-16 SURGICAL SUPPLY — 27 items
BLADE SHAVER TRUDI 4 15 DEG (ENT DISPOSABLE) ×4 IMPLANT
BLADE SHAVER TRUDI STR 4 (ENT DISPOSABLE) ×4 IMPLANT
CABLE TRUDI DISPOSABLE (ENT DISPOSABLE) ×8 IMPLANT
CANISTER SUCT 1200ML W/VALVE (MISCELLANEOUS) ×4 IMPLANT
COAGULATOR SUCT 8FR VV (MISCELLANEOUS) ×4 IMPLANT
DRESSING NASL FOAM PST OP SINU (MISCELLANEOUS) IMPLANT
DRSG NASAL FOAM POST OP SINU (MISCELLANEOUS) ×4
ELECT REM PT RETURN 9FT ADLT (ELECTROSURGICAL) ×4
ELECTRODE REM PT RTRN 9FT ADLT (ELECTROSURGICAL) ×3 IMPLANT
GLOVE SURG ENC MOIS LTX SZ7.5 (GLOVE) ×8 IMPLANT
GOWN STRL REUS W/ TWL LRG LVL3 (GOWN DISPOSABLE) ×3 IMPLANT
GOWN STRL REUS W/TWL LRG LVL3 (GOWN DISPOSABLE) ×4
IV NS 500ML (IV SOLUTION) ×4
IV NS 500ML BAXH (IV SOLUTION) ×3 IMPLANT
KIT TURNOVER KIT A (KITS) ×4 IMPLANT
NS IRRIG 500ML POUR BTL (IV SOLUTION) ×4 IMPLANT
PACK ENT CUSTOM (PACKS) ×4 IMPLANT
PACKING NASAL EPIS 4X2.4 XEROG (MISCELLANEOUS) ×2 IMPLANT
PATTIES SURGICAL .5 X3 (DISPOSABLE) ×4 IMPLANT
SOL ANTI-FOG 6CC FOG-OUT (MISCELLANEOUS) ×3 IMPLANT
SOL FOG-OUT ANTI-FOG 6CC (MISCELLANEOUS) ×1
STRAP BODY AND KNEE 60X3 (MISCELLANEOUS) ×1 IMPLANT
SYR 10ML LL (SYRINGE) ×4 IMPLANT
TRACKER DISPOSABLE PAITIENT (MISCELLANEOUS) ×4 IMPLANT
TUBING CONNECTING 10 (TUBING) ×4 IMPLANT
TUBING IRRIGATION BIEN-AIR (TUBING) ×4 IMPLANT
WATER STERILE IRR 250ML POUR (IV SOLUTION) ×1 IMPLANT

## 2022-03-16 NOTE — Transfer of Care (Signed)
Immediate Anesthesia Transfer of Care Note  Patient: Ronald Cordova  Procedure(s) Performed: IMAGE GUIDED SINUS SURGERY (Nose) MAXILLARY ANTROSTOMY WITH TISSUE REMOVAL (Left: Nose) TOTAL ETHMOIDECTOMY-LEFT REVISION ETHMOIDECTOMY-RIGHT (Bilateral: Nose) FRONTAL SINUS EXPLORATION (Left: Nose) SPHENOIDECTOMY WITH TISSUE REMOVAL (Bilateral: Nose)  Patient Location: PACU  Anesthesia Type: General ETT  Level of Consciousness: awake, alert  and patient cooperative  Airway and Oxygen Therapy: Patient Spontanous Breathing and Patient connected to supplemental oxygen  Post-op Assessment: Post-op Vital signs reviewed, Patient's Cardiovascular Status Stable, Respiratory Function Stable, Patent Airway and No signs of Nausea or vomiting  Post-op Vital Signs: Reviewed and stable  Complications: No notable events documented.

## 2022-03-16 NOTE — H&P (Signed)
History and physical reviewed and will be scanned in later. No change in medical status reported by the patient or family, appears stable for surgery. All questions regarding the procedure answered, and patient (or family if a child) expressed understanding of the procedure. ? ?Ronald Cordova Ronald Cordova ?@TODAY@ ?

## 2022-03-16 NOTE — Op Note (Signed)
03/16/2022  10:32 AM    Estill Batten  [id number]   Pre-Op Diagnosis:  CHRONIC SINUSITIS  Post-op Diagnosis: CHRONIC SINUSITIS  Procedure:  1)  Image Guided Sinus Surgery,   2)  Left Endoscopic Maxillary Antrostomy with Tissue Removal   3)  Left Frontal Sinusotomy with tissue   4)  Bilateral Revision Total Ethmoidectomy   5)  Bilateral Sphenoidotomy with tissue removal    Surgeon:  Sandi Mealy  Anesthesia:  General endotracheal  EBL:  100cc  Complications:  None  Findings: Polyps obstructing the left anterior aspect of the prior maxillary antrostomy with some retained uncinates, polyps partially obstructing the left ethmoids and left frontal recess with retained Agger Nasi cell, polyps obstructing both sphenoid recesses and ostia.  Scattered polypoid change in the right ethmoids.  Patent right maxillary antrostomy and frontal recess.  Procedure: After the patient was identified in holding and the benefits of the procedure were reviewed as well as the consent and risks, the patient was taken to the operating room and with the patient in a comfortable supine position,  general orotracheal anesthesia was induced without difficulty.  A proper time-out was performed.  The Trudi image guidance system was set up and calibrated in the normal fashion and felt to be acceptable.  Next 1% Xylocaine with 1:100,000 epinephrine was infiltrated into the anterior middle turbinates and lateral nasal wall bilaterally.  Several minutes were allowed for this to take effect.  Cottonoid pledgets soaked in Afrin were placed into both nasal cavities and left while the patient was prepped and draped in the standard fashion. The image guided suction was calibrated and used to inspect known points in the nasal cavity to assess accuracy of the image guided system. Accuracy was felt to be excellent.   The left middle turbinate was medialized and the left nasal cavity inspected.  There was a polyp partially  obstructing the anterior outflow tract of the previously made maxillary antrostomy.  This was resected with the microdebrider.  Some residual uncinate process then resected with through-cutting forceps as well as the microdebrider.  The left maxillary sinus was suctioned to clear secretions.   Next the left middle ethmoid sinuses were noted to have small polyps obstructing the ethmoids which had been previously opened.  These were resected with combination of through-cutting forceps and the microdebrider, frequently reassessing the anatomy with the image guided suction to prevent injury to the lamina papyracea and skull base.  Dissection proceeded anteriorly and superiorly into the left frontal recess which was dissected utilizing a 30  scope and frontal curved instruments. The curved image guided suction was used during this dissection to frequently reassess the anatomy on the CT scan. The frontal recess was dissected, removing polyps and some retained portions of the Ageri Nasi cell, allowing dissection up into the frontal sinus which was widely opened.  The image guided suction passed easily into the depths of the frontal sinus at this point.  Next the scope was passed medial to the middle turbinate and the left sphenoid recess inspected.  Polyps were removed from the sphenoid recess with the microdebrider.  With the assistance of the image guided system, the sphenoid sinus was carefully entered through some thin bone at the anterior face of the spenoid, medial to the superior turbinate, and widely opened with through-cutting sphenoid punch forceps, removing polypoid soft tissue and bone to create a large patent sphenoid os. Mucus was suctioned from the sphenoid sinus.  Attention was then turned  to the right side where the same procedure was performed on the right ethmoids and left sphenoid sinus.  The right maxillary sinus had a widely patent antrostomy and the frontal sinus os was also widely  patent.  The nose was suctioned and inspected.  During the procedure there was quite a bit of oozing due to polypoid inflammation.  There were 2 areas requiring cautery, one at the base of the maxillary polyp and one area in the frontal recess, but otherwise there was no arterial bleeding, and this all settled down after placing Afrin moistened pledgets.  Stammberger absorbable gel packing was placed in the ethmoids bilaterally as well as into the sphenoid recess followed by placement of xerogel absorbable sinus packing  in the ethmoid cavities bilaterally.   The patient was then returned to the anesthesiologist for awakening and taken to recovery room in good condition postoperatively.  Disposition:   PACU and d/c home  Plan: Ice, elevation, narcotic analgesia and prophylactic antibiotics. Begin sinus irrigations with saline tomorrow, irrigating 3-4 times daily. Return to the office in 7 days.  Return to work in 7-10 days, no strenuous activities for two weeks.   Sandi Mealy 03/16/2022 10:32 AM

## 2022-03-16 NOTE — Anesthesia Procedure Notes (Signed)
Procedure Name: Intubation Date/Time: 03/16/2022 7:41 AM  Performed by: Jerrye Noble, CRNAPre-anesthesia Checklist: Patient identified, Emergency Drugs available, Suction available and Patient being monitored Patient Re-evaluated:Patient Re-evaluated prior to induction Oxygen Delivery Method: Circle system utilized Preoxygenation: Pre-oxygenation with 100% oxygen Induction Type: IV induction Ventilation: Mask ventilation with difficulty, Two handed mask ventilation required and Oral airway inserted - appropriate to patient size Laryngoscope Size: McGraph and 4 Grade View: Grade I Tube type: Oral Rae Tube size: 7.5 mm Number of attempts: 1 Airway Equipment and Method: Stylet, Oral airway and Video-laryngoscopy Placement Confirmation: ETT inserted through vocal cords under direct vision, positive ETCO2 and breath sounds checked- equal and bilateral Tube secured with: Tape Dental Injury: Teeth and Oropharynx as per pre-operative assessment  Comments: Grade 4 view with MAC 4 blade.

## 2022-03-16 NOTE — Anesthesia Postprocedure Evaluation (Signed)
Anesthesia Post Note  Patient: Ronald Cordova  Procedure(s) Performed: IMAGE GUIDED SINUS SURGERY (Nose) MAXILLARY ANTROSTOMY WITH TISSUE REMOVAL (Left: Nose) TOTAL ETHMOIDECTOMY-LEFT REVISION ETHMOIDECTOMY-RIGHT (Bilateral: Nose) FRONTAL SINUS EXPLORATION (Left: Nose) SPHENOIDECTOMY WITH TISSUE REMOVAL (Bilateral: Nose)     Patient location during evaluation: PACU Anesthesia Type: General Level of consciousness: awake and alert Pain management: pain level controlled Vital Signs Assessment: post-procedure vital signs reviewed and stable Respiratory status: spontaneous breathing, nonlabored ventilation and respiratory function stable Cardiovascular status: blood pressure returned to baseline and stable Postop Assessment: no apparent nausea or vomiting Anesthetic complications: no   No notable events documented.  Foye Deer

## 2022-03-17 ENCOUNTER — Encounter: Payer: Self-pay | Admitting: Otolaryngology

## 2022-03-17 LAB — SURGICAL PATHOLOGY

## 2022-03-26 ENCOUNTER — Ambulatory Visit: Admission: EM | Admit: 2022-03-26 | Discharge: 2022-03-26 | Payer: BC Managed Care – PPO

## 2022-03-26 ENCOUNTER — Emergency Department: Payer: BC Managed Care – PPO

## 2022-03-26 ENCOUNTER — Ambulatory Visit (INDEPENDENT_AMBULATORY_CARE_PROVIDER_SITE_OTHER): Payer: BC Managed Care – PPO

## 2022-03-26 ENCOUNTER — Encounter: Payer: Self-pay | Admitting: Emergency Medicine

## 2022-03-26 ENCOUNTER — Other Ambulatory Visit: Payer: Self-pay

## 2022-03-26 DIAGNOSIS — Z20822 Contact with and (suspected) exposure to covid-19: Secondary | ICD-10-CM | POA: Diagnosis not present

## 2022-03-26 DIAGNOSIS — R0789 Other chest pain: Secondary | ICD-10-CM | POA: Diagnosis not present

## 2022-03-26 DIAGNOSIS — R0600 Dyspnea, unspecified: Secondary | ICD-10-CM | POA: Insufficient documentation

## 2022-03-26 DIAGNOSIS — R0602 Shortness of breath: Secondary | ICD-10-CM | POA: Diagnosis not present

## 2022-03-26 LAB — BASIC METABOLIC PANEL
Anion gap: 7 (ref 5–15)
BUN: 15 mg/dL (ref 6–20)
CO2: 21 mmol/L — ABNORMAL LOW (ref 22–32)
Calcium: 9.2 mg/dL (ref 8.9–10.3)
Chloride: 107 mmol/L (ref 98–111)
Creatinine, Ser: 0.76 mg/dL (ref 0.61–1.24)
GFR, Estimated: >60 mL/min (ref >60)
Glucose, Bld: 110 mg/dL — ABNORMAL HIGH (ref 70–99)
Potassium: 4.3 mmol/L (ref 3.5–5.1)
Sodium: 135 mmol/L (ref 135–145)

## 2022-03-26 LAB — CBC
HCT: 42.3 % (ref 39.0–52.0)
Hemoglobin: 13.5 g/dL (ref 13.0–17.0)
MCH: 29.7 pg (ref 26.0–34.0)
MCHC: 31.9 g/dL (ref 30.0–36.0)
MCV: 93 fL (ref 80.0–100.0)
Platelets: 321 10*3/uL (ref 150–400)
RBC: 4.55 MIL/uL (ref 4.22–5.81)
RDW: 13.4 % (ref 11.5–15.5)
WBC: 15.3 10*3/uL — ABNORMAL HIGH (ref 4.0–10.5)
nRBC: 0 % (ref 0.0–0.2)

## 2022-03-26 LAB — TROPONIN I (HIGH SENSITIVITY): Troponin I (High Sensitivity): 9 ng/L (ref <18)

## 2022-03-26 LAB — D-DIMER, QUANTITATIVE: D-Dimer, Quant: <0.27 ug/mL-FEU (ref 0.00–0.50)

## 2022-03-26 NOTE — ED Provider Notes (Signed)
Ronald Cordova    CSN: [id number] Arrival date & time: 03/26/22  1933      History   Chief Complaint Chief Complaint  Patient presents with   Wheezing    Entered by patient    HPI Ronald Cordova is a 56 y.o. male.  Patient presents with wheezing, nonproductive cough, shortness of breath x 3-4 days.  He has chest tightness with coughing.  He attempted to mow his yard today and felt worse.  He denies fever, chills, sore throat, chest pain, nausea, vomiting, diarrhea, or other symptoms.  He had sinus surgery on 03/16/2022; he was seen for follow-up yesterday.  He has been on prednisone and Levaquin.  His medical history includes hypertension, GERD, IBS, vertigo, anxiety.  He has history of pneumonia 2022.  The history is provided by the patient and medical records.    Past Medical History:  Diagnosis Date   Anxiety state, unspecified    Arthritis    Chest pain with moderate risk for cardiac etiology 01/21/2012   Long history of tobacco use, now quit.   CT ordered   Community acquired pneumonia 05/18/2021   Dental crowns present    Eosinophilic esophagitis 4166   by EGD with  biopsies   GERD (gastroesophageal reflux disease)    carafate added by Dr. Collene Mares   Hemorrhoids    Hypertension    Irritable bowel syndrome    Polyp of colon, hyperplastic 01/08/2011   Ronald Cordova, repeat due 2018   PONV (postoperative nausea and vomiting)    Shoulder pain, left    Treadmill stress test negative for angina pectoris 2003   Vertigo    approx 2021    Patient Active Problem List   Diagnosis Date Noted   Low testosterone in male 02/13/2022   Fatigue 11/16/2021   Screen for STD (sexually transmitted disease) 04/19/2021   Hematuria, unspecified 04/19/2021   Glucosuria with normal serum glucose 03/17/2021   Edema 12/17/2019   Colon cancer screening 08/29/2018   B12 deficiency 08/29/2018   Allergic rhinitis 04/25/2018   Aortic atherosclerosis ([place]) 11/21/2017   Chronic  right-sided low back pain without sciatica 09/08/2017   Fatty liver 03/15/2017   Vitamin D deficiency 12/23/2016   Restless legs 12/21/2016   Ulnar neuropathy at elbow of left upper extremity 06/29/2016   Family history of sudden death in brother 12-15-2015   Polyarthralgia 12/15/2015   Chronic bilateral thoracic back pain 07/29/2015   Palpitations 07/29/2015   Essential hypertension 11/13/2014   Major depressive disorder, recurrent episode, 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[place]) 11/03/2014   Benign localized hyperplasia of prostate with urinary obstruction 02/19/2013   Hypogonadism male 02/19/2013   Reduced libido 02/19/2013   Obesity (BMI 30.0-34.9) 08/28/2012   Incidental pulmonary nodule, > 85m and < 859m07/03/2012   Tobacco abuse, 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   Isolated or specific phobia 01/21/2012   Mixed hyperlipidemia 05/09/2011   Anxiety state    Irritable bowel syndrome    Internal hemorrhoids     Past Surgical History:  Procedure Laterality Date   APPENDECTOMY     done during high school   carpal tunnel release  Sept 2008   R hand, by  [place], [place]    ETHMOIDECTOMY Bilateral 03/16/2022   Procedure: TOTAL ETHMOIDECTOMY-LEFT REVISION ETHMOIDECTOMY-RIGHT;  Surgeon: BeClyde CanterburyMD;  [place] [facility] Service: ENT;  Laterality: Bilateral;   FRONTAL SINUS EXPLORATION Left 03/16/2022   Procedure: FRONTAL SINUS EXPLORATION;  Surgeon: BeClyde CanterburyMD;  Location:  [facility];  Service: ENT;  Laterality: Left;   IMAGE GUIDED SINUS SURGERY N/A 03/16/2022   Procedure: IMAGE GUIDED SINUS SURGERY;  Surgeon: Ronald Canterbury, MD;  Location: [facility];  Service: ENT;  Laterality: N/A;  STRYKER DISK ON OR CHARGE NURSE DESK 7-7 KP   JOINT REPLACEMENT     KNEE ARTHROSCOPY  2000   left   MAXILLARY ANTROSTOMY Left 03/16/2022   Procedure: MAXILLARY ANTROSTOMY WITH TISSUE REMOVAL;  Surgeon: Ronald Canterbury, MD;  Location: [facility];  Service: ENT;  Laterality:  Left;   NASAL SINUS SURGERY  1998   bilateral   SPHENOIDECTOMY Bilateral 03/16/2022   Procedure: SPHENOIDECTOMY WITH TISSUE REMOVAL;  Surgeon: Ronald Canterbury, MD;  Location: [facility];  Service: ENT;  Laterality: Bilateral;       Home Medications    Prior to Admission medications   Medication Sig Start Date End Date Taking? Authorizing Provider  clonazePAM (KLONOPIN) 1 MG tablet TAKE 1 TABLET (1 MG TOTAL) BY MOUTH DAILY. AS NEEDED FOR ANXIETY 02/01/22   Ronald Mc, MD  doxazosin (CARDURA) 2 MG tablet TAKE 1 TABLET DAILY 02/09/22   Ronald Mc, MD  ergocalciferol (DRISDOL) 1.25 MG (50000 UT) capsule Take 1 capsule (50,000 Units total) by mouth once a week. 02/18/22   Ronald Mc, MD  ezetimibe (ZETIA) 10 MG tablet TAKE 1 TABLET DAILY 02/09/22   Ronald Mc, MD  famotidine (PEPCID) 40 MG tablet Take 40 mg by mouth 2 (two) times daily as needed for heartburn or indigestion.    [provider]  fluticasone (FLONASE) 50 MCG/ACT nasal spray Place 2 sprays into both nostrils daily. 07/29/21   Melynda Ripple, MD  lansoprazole (PREVACID) 30 MG capsule Take 1 capsule (30 mg total) by mouth daily at 12 noon. 11/09/21   Ronald Mc, MD  levofloxacin (LEVAQUIN) 500 MG tablet Take 500 mg by mouth daily. 02/26/22   [provider]  losartan-hydrochlorothiazide (HYZAAR) 100-12.5 MG tablet TAKE 1 TABLET BY MOUTH EVERY DAY 12/15/21   Ronald Mc, MD  nystatin cream (MYCOSTATIN) Apply to affected area 2 times daily 12/22/21   Ronald Mc, MD  ondansetron (ZOFRAN ODT) 4 MG disintegrating tablet Take 1 tablet (4 mg total) by mouth every 8 (eight) hours as needed for nausea or vomiting. 05/05/21   Sharion Balloon, NP  predniSONE (STERAPRED UNI-PAK 21 TAB) 10 MG (21) TBPK tablet Sterapred DS 6 day taper. Take as directed. 03/16/22   Ronald Canterbury, MD  RETIN-A 0.01 % gel  06/25/19   [provider]  rosuvastatin (CRESTOR) 5 MG tablet TAKE 1 TABLET DAILY 02/09/22    Ronald Mc, MD  sildenafil (REVATIO) 20 MG tablet TAKE 3 TO 5 TABLETS BY MOUTH DAILY AS NEEDED 02/03/22   Ronald Mc, MD  Spacer/Aero-Holding Chambers (AEROCHAMBER PLUS) inhaler Use with inhaler 07/29/21   Melynda Ripple, MD  Syringe/Needle, Disp, (SYRINGE 3CC/18GX1-1/2") 18G X 1-1/2" 3 ML MISC Use to draw up testosterone 02/15/22   Ronald Mc, MD  Syringe/Needle, Disp, (SYRINGE 3CC/22GX1-1/2") 22G X 1-1/2" 3 ML MISC Use to administer testosterone  every 2 weeks 02/15/22   Ronald Mc, MD  Syringe/Needle, Disp, (SYRINGE 3CC/25GX1") 25G X 1" 3 ML MISC Use for b12 injections 12/17/21   Ronald Mc, MD  testosterone cypionate (DEPOTESTOSTERONE CYPIONATE) 200 MG/ML injection Inject 0.75 mLs (150 mg total) into the muscle every 14 (fourteen) days. 02/15/22   Ronald Mc, MD  tiZANidine (ZANAFLEX) 4 MG tablet Take 1 tablet (4 mg total) by mouth every 8 (eight) hours as needed for muscle spasms. 06/23/20   Ronald Mc, MD  traMADol (ULTRAM) 50 MG tablet TAKE 1 TABLET BY MOUTH EVERY 6 HOURS AS NEEDED. 01/25/22   Ronald Mc, MD  triamcinolone ointment (KENALOG) 0.1 % Apply 1 application topically 2 (two) times daily. 05/06/20   [provider]    Family History Family History  Problem Relation Age of Onset   Hyperlipidemia Sister    Hyperlipidemia Brother    Heart attack Brother 64   Hyperlipidemia Mother    Hypertension Mother    Hyperlipidemia Father    Hypertension Father    BRCA 1/2 Maternal Aunt     Social History Social History   Tobacco Use   Smoking status: Former    Packs/day: 1.00    Years: 15.00    Total pack years: 15.00    Types: Cigarettes    Quit date: 05/17/2011    Years since quitting: 10.8   Smokeless tobacco: Never  Vaping Use   Vaping Use: Never used  Substance Use Topics   Alcohol use: Yes    Comment: occasional   Drug use: No     Allergies   Clindamycin/lincomycin, Penicillin g, and Penicillins   Review of  Systems Review of Systems  Constitutional:  Negative for chills and fever.  HENT:  Negative for sore throat.   Respiratory:  Positive for cough, chest tightness and shortness of breath.   Cardiovascular:  Negative for chest pain and palpitations.  Gastrointestinal:  Negative for nausea and vomiting.  All other systems reviewed and are negative.    Physical Exam Triage Vital Signs ED Triage Vitals  Enc Vitals Group     BP 03/26/22 1940 (!) 144/80     Pulse Rate 03/26/22 1940 (!) 108     Resp 03/26/22 1940 18     Temp 03/26/22 1940 98.2 F (36.8 C)     Temp src --      SpO2 03/26/22 1940 96 %     Weight 03/26/22 1944 225 lb (102.1 kg)     Height 03/26/22 1944 _0  (1.753 m)     Head Circumference --      Peak Flow --      Pain Score 03/26/22 1944 0     Pain Loc --      Pain Edu? --      Excl. in [place]? --    No data found.  Updated Vital Signs BP (!) 144/80   Pulse (!) 108   Temp 98.2 F (36.8 C)   Resp 18   Ht _1  (1.753 m)   Wt 225 lb (102.1 kg)   SpO2 96%   BMI 33.23 kg/m   Visual Acuity Right Eye Distance:   Left Eye Distance:   Bilateral Distance:    Right Eye Near:   Left Eye Near:    Bilateral Near:     Physical Exam Vitals and nursing note reviewed.  Constitutional:      General: He is not in acute distress.    Appearance: Normal appearance. He is well-developed. He is not ill-appearing.  HENT:     Right Ear: Tympanic membrane normal.     Left Ear: Tympanic membrane normal.     Nose: Nose normal.     Mouth/Throat:     Mouth: Mucous membranes are moist.     Pharynx: Oropharynx is clear.  Cardiovascular:     Rate and Rhythm: Normal rate and regular rhythm.     Heart sounds: Normal heart sounds.  Pulmonary:     Effort: Pulmonary effort is normal. No respiratory distress.     Breath sounds: Normal breath sounds.     Comments: Mildly short of breath. Musculoskeletal:     Cervical back: Neck supple.  Skin:    General: Skin is warm and dry.   Neurological:     Mental Status: He is alert.  Psychiatric:        Mood and Affect: Mood normal.        Behavior: Behavior normal.      UC Treatments / Results  Labs (all labs ordered are listed, but only abnormal results are displayed) Labs Reviewed - No data to display  EKG   Radiology DG Chest 2 View  Result Date: 03/26/2022 CLINICAL DATA:  Cough and wheezing for several days. Shortness of breath. EXAM: CHEST - 2 VIEW COMPARISON:  07/03/2021 FINDINGS: The heart size and mediastinal contours are within normal limits. Both lungs are clear. The visualized skeletal structures are unremarkable. IMPRESSION: No active cardiopulmonary disease. Electronically Signed   By: Marlaine Hind M.D.   On: 03/26/2022 20:12    Procedures Procedures (including critical care time)  Medications Ordered in UC Medications - No data to display  Initial Impression / Assessment and Plan / UC Course  I have reviewed the triage vital signs and the nursing notes.  Pertinent labs & imaging results that were available during my care of the patient were reviewed by me and considered in my medical decision making (see chart for details).    Shortness of breath, chest tightness.  EKG shows sinus tachycardia, rate 109, no ST elevation, compared to previous from 12/21/2021.  Chest x-ray negative. Discussed evaluation of his symptoms here in the urgent care.  Discussed concerns for causes of his symptoms which need to be worked up in the emergency department.  Sending patient to the ED for evaluation.  He is agreeable to this.  He feels stable to transport himself POV.  Final Clinical Impressions(s) / UC Diagnoses   Final diagnoses:  Shortness of breath  Chest tightness     Discharge Instructions      Go to the emergency department for evaluation of your shortness of breath and chest tightness.     ED Prescriptions   None    PDMP not reviewed this encounter.   Sharion Balloon, NP 03/26/22  2028

## 2022-03-26 NOTE — ED Notes (Signed)
Patient is being discharged from the Urgent Care and sent to the Emergency Department via POV . Per Wendee Beavers, NP, patient is in need of higher level of care due to Shortness of Breath and Chest Tightness. Patient is aware and verbalizes understanding of plan of care.  Vitals:   03/26/22 1940  BP: (!) 144/80  Pulse: (!) 108  Resp: 18  Temp: 98.2 F (36.8 C)  SpO2: 96%

## 2022-03-26 NOTE — ED Triage Notes (Signed)
Patient to UC with complaints of wheezing for several days. Reports mowing the yard today for the first time since his sinus surgery 8/1. Patient did have his sinuses scrapped out yesterday and had a lot of bloody drainage. Reports heaviness in his chest when taking deep breaths.   Reports that he generally doesn't feel good and the last time he felt this way he had pneumonia. Reports after mowing his yard today he felt very fatigued.

## 2022-03-26 NOTE — Discharge Instructions (Addendum)
Go to the emergency department for evaluation of your shortness of breath and chest tightness.

## 2022-03-26 NOTE — ED Triage Notes (Signed)
Pt presents via POV with complaints of SOB that started 3 days ago. Hx of sinus surgery on 8/1. He notes that when he mowed the lawn today stated that he felt fatigued after doing that but he hasn't been very active since the procedure. Respirations are equal and unlabored in triage. Of note, the patient was seen at [facility] who recommended he come be seen in the ED. Denies fevers, cough, nasal congestion, CP.

## 2022-03-27 ENCOUNTER — Emergency Department
Admission: EM | Admit: 2022-03-27 | Discharge: 2022-03-27 | Disposition: A | Discharge status: Home / Self Care | Payer: BC Managed Care – PPO | Attending: Emergency Medicine | Admitting: Emergency Medicine

## 2022-03-27 DIAGNOSIS — R0600 Dyspnea, unspecified: Secondary | ICD-10-CM

## 2022-03-27 LAB — SARS CORONAVIRUS 2 BY RT PCR: SARS Coronavirus 2 by RT PCR: NEGATIVE

## 2022-03-27 NOTE — Discharge Instructions (Addendum)
Your workup in the Emergency Department today was reassuring.  We did not find any specific abnormalities.  We recommend you drink plenty of fluids, take your regular medications and/or any new ones prescribed today, and follow up with the doctor(s) listed in these documents as recommended.  Return to the Emergency Department if you develop new or worsening symptoms that concern you.  

## 2022-03-27 NOTE — ED Provider Notes (Signed)
[facility] Provider Note    Event Date/Time   First MD Initiated Contact with Patient 03/27/22 [phone]     (approximate)   History   Shortness of Breath   HPI  Ronald Cordova is a 56 y.o. male who presents by private vehicle for evaluation of shortness of breath.  He reports that about 2 weeks ago he had sinus surgery and he has had very little exercise or activity since then.  He has had "a lot of drugs in my system" between all the sinus treatments, anesthesia, and medications.  He has been cleared by Dr. Willeen Cass and had his sinuses "cleaned out" after the surgery and was cleared to go back to his activity.  He loves doing yard work, so he went outside today to [employer] the lawn and started to feel more fatigued and short of breath, although he said he has been feeling a little bit "wheezy" for about 3 days.  He has no history of lung disease and no personal history of blood clots in the legs of the lungs, although he notes that his mother developed a DVT after she had orthopedic surgery on her knee a few years ago.  He went to urgent care for evaluation and they sent him to the emergency department for further assessment.  He said he feels better now and is no longer short of breath.  He has not been having chest pain.  He has not had any recent fevers or cough.  He denies nausea and vomiting as well as abdominal pain.     Physical Exam   Triage Vital Signs: ED Triage Vitals [03/26/22 2204]  Enc Vitals Group     BP (!) 150/91     Pulse Rate 84     Resp 18     Temp 98.5 F (36.9 C)     Temp Source Oral     SpO2 98 %     Weight 103 kg (227 lb 1.2 oz)     Height 1.753 m (5\' 9" )     Head Circumference      Peak Flow      Pain Score 0     Pain Loc      Pain Edu?      Excl. in [place]?     Most recent vital signs: Vitals:   03/26/22 2204 03/27/22 0251  BP: (!) 150/91 134/79  Pulse: 84 82  Resp: 18 17  Temp: 98.5 F (36.9 C)   SpO2: 98% 99%      General: Awake, no distress.  CV:  Good peripheral perfusion.  Normal heart sounds. Resp:  Normal effort.  Lungs are clear to auscultation bilaterally.  There is no wheezing, rales, nor rhonchi. Abd:  No distention.  No tenderness to palpation of his abdomen. Other:  Mood and affect are normal.  He is pleasant, awake and alert and oriented.  No distress.    ED Results / Procedures / Treatments   Labs (all labs ordered are listed, but only abnormal results are displayed) Labs Reviewed  BASIC METABOLIC PANEL - Abnormal; Notable for the following components:      Result Value   CO2 21 (*)    Glucose, Bld 110 (*)    All other components within normal limits  CBC - Abnormal; Notable for the following components:   WBC 15.3 (*)    All other components within normal limits  SARS CORONAVIRUS 2 BY RT PCR  D-DIMER, QUANTITATIVE  TROPONIN I (HIGH SENSITIVITY)     EKG  ED ECG REPORT I, Loleta Rose, the attending physician, personally viewed and interpreted this ECG.  Date: 03/26/2022 EKG Time: 22: 06 Rate: 87 Rhythm: normal sinus rhythm QRS Axis: normal Intervals: normal ST/T Wave abnormalities: normal Narrative Interpretation: no evidence of acute ischemia    RADIOLOGY I viewed and interpreted the patient's two-view chest x-ray.  I see no evidence of pneumonia, pneumothorax, nor other acute abnormality.  I also read the radiologist's report, which confirmed no acute findings.    PROCEDURES:  Critical Care performed: No  .1-3 Lead EKG Interpretation  Performed by: Loleta Rose, MD Authorized by: Loleta Rose, MD     Interpretation: normal     ECG rate:  79   ECG rate assessment: normal     Rhythm: sinus rhythm     Ectopy: none     Conduction: normal      MEDICATIONS ORDERED IN ED: Medications - No data to display   IMPRESSION / MDM / ASSESSMENT AND PLAN / ED COURSE  I reviewed the triage vital signs and the nursing notes.                               Differential diagnosis includes, but is not limited to, PE, ACS, pneumonia, deconditioning, anxiety or panic attack, new onset arrhythmia such as A-fib, new onset asthma or COPD.  Patient's presentation is most consistent with acute presentation with potential threat to life or bodily function.  Labs/studies ordered: EKG, two-view chest x-ray, CBC, high-sensitivity troponin, basic metabolic panel, D-dimer, COVID-19 PCR.  Fortunately the patient's labs have been reassuring.  Vital signs are stable and within normal limits including no hypoxemia nor tachycardia.  No ischemia on EKG.  As documented above, two-view chest x-ray is normal.  Labs are all within normal notes except for a leukocytosis of 15.3 which is nonspecific and the patient has recently had surgery.  His high-sensitivity troponin is 9 and he had the test drawn many hours after the initial onset of symptoms, so there is no indication to repeat, particularly as he is low risk for ACS.  His Wells score for PE is 0 and he has a D-dimer that is well within normal limits which is very reassuring.  Patient is asymptomatic at this time.  I provided all of the reassuring information and he agrees with the plan for discharge and outpatient follow-up with his PCP.  I considered hospitalization initially for additional cardiology evaluation such as possible echocardiogram, but given the very low risk of ACS and the overall reassuring work-up, I feel that follow-up as an outpatient is very appropriate.  The patient agrees with the plan.  The patient was on the cardiac monitor to evaluate for evidence of arrhythmia and/or significant heart rate changes.      FINAL CLINICAL IMPRESSION(S) / ED DIAGNOSES   Final diagnoses:  Dyspnea, unspecified type     Rx / DC Orders   ED Discharge Orders     None        Note:  This document was prepared using Dragon voice recognition software and may include unintentional dictation errors.    Loleta Rose, MD 03/27/22 [phone]

## 2022-03-30 ENCOUNTER — Other Ambulatory Visit: Payer: Self-pay | Admitting: Internal Medicine

## 2022-04-22 ENCOUNTER — Ambulatory Visit
Admission: RE | Admit: 2022-04-22 | Discharge: 2022-04-22 | Disposition: A | Discharge status: Home / Self Care | Payer: BC Managed Care – PPO | Source: Ambulatory Visit | Attending: Gastroenterology | Admitting: Gastroenterology

## 2022-04-22 DIAGNOSIS — R1011 Right upper quadrant pain: Secondary | ICD-10-CM | POA: Diagnosis present

## 2022-04-22 MED ORDER — TECHNETIUM TC 99M MEBROFENIN IV KIT
5.0600 | PACK | Freq: Once | INTRAVENOUS | Status: AC | PRN
  Administered 2022-04-22: 5.06 via INTRAVENOUS

## 2022-04-29 ENCOUNTER — Other Ambulatory Visit: Payer: Self-pay | Admitting: Internal Medicine

## 2022-04-30 ENCOUNTER — Encounter: Payer: Self-pay | Admitting: Internal Medicine

## 2022-04-30 DIAGNOSIS — G8929 Other chronic pain: Secondary | ICD-10-CM | POA: Insufficient documentation

## 2022-05-24 ENCOUNTER — Ambulatory Visit: Payer: BC Managed Care – PPO | Admitting: Internal Medicine

## 2022-05-24 ENCOUNTER — Encounter: Payer: Self-pay | Admitting: Internal Medicine

## 2022-05-24 VITALS — BP 142/84 | HR 85 | Temp 97.5°F | Ht 69.0 in | Wt 223.2 lb

## 2022-05-24 DIAGNOSIS — R7989 Other specified abnormal findings of blood chemistry: Secondary | ICD-10-CM

## 2022-05-24 DIAGNOSIS — E782 Mixed hyperlipidemia: Secondary | ICD-10-CM

## 2022-05-24 DIAGNOSIS — Z23 Encounter for immunization: Secondary | ICD-10-CM | POA: Diagnosis not present

## 2022-05-24 DIAGNOSIS — E559 Vitamin D deficiency, unspecified: Secondary | ICD-10-CM | POA: Diagnosis not present

## 2022-05-24 DIAGNOSIS — R079 Chest pain, unspecified: Secondary | ICD-10-CM

## 2022-05-24 DIAGNOSIS — I10 Essential (primary) hypertension: Secondary | ICD-10-CM

## 2022-05-24 DIAGNOSIS — F411 Generalized anxiety disorder: Secondary | ICD-10-CM

## 2022-05-24 DIAGNOSIS — R002 Palpitations: Secondary | ICD-10-CM

## 2022-05-24 LAB — COMPREHENSIVE METABOLIC PANEL
ALT: 20 U/L (ref 0–53)
AST: 17 U/L (ref 0–37)
Albumin: 4.3 g/dL (ref 3.5–5.2)
Alkaline Phosphatase: 54 U/L (ref 39–117)
BUN: 14 mg/dL (ref 6–23)
CO2: 27 mEq/L (ref 19–32)
Calcium: 9.5 mg/dL (ref 8.4–10.5)
Chloride: 106 mEq/L (ref 96–112)
Creatinine, Ser: 0.87 mg/dL (ref 0.40–1.50)
GFR: 97.05 mL/min (ref >60.00)
Glucose, Bld: 100 mg/dL — ABNORMAL HIGH (ref 70–99)
Potassium: 4.3 mEq/L (ref 3.5–5.1)
Sodium: 141 mEq/L (ref 135–145)
Total Bilirubin: 0.6 mg/dL (ref 0.2–1.2)
Total Protein: 6.8 g/dL (ref 6.0–8.3)

## 2022-05-24 LAB — LIPID PANEL
Cholesterol: 97 mg/dL (ref 0–200)
HDL: 33.9 mg/dL — ABNORMAL LOW (ref >39.00)
LDL Cholesterol: 45 mg/dL (ref 0–99)
NonHDL: 63.03
Total CHOL/HDL Ratio: 3
Triglycerides: 89 mg/dL (ref 0.0–149.0)
VLDL: 17.8 mg/dL (ref 0.0–40.0)

## 2022-05-24 LAB — CBC WITH DIFFERENTIAL/PLATELET
Basophils Absolute: 0 10*3/uL (ref 0.0–0.1)
Basophils Relative: 0.8 % (ref 0.0–3.0)
Eosinophils Absolute: 0.2 10*3/uL (ref 0.0–0.7)
Eosinophils Relative: 4.2 % (ref 0.0–5.0)
HCT: 43.1 % (ref 39.0–52.0)
Hemoglobin: 14.1 g/dL (ref 13.0–17.0)
Lymphocytes Relative: 42.3 % (ref 12.0–46.0)
Lymphs Abs: 1.8 10*3/uL (ref 0.7–4.0)
MCHC: 32.7 g/dL (ref 30.0–36.0)
MCV: 89.1 fl (ref 78.0–100.0)
Monocytes Absolute: 0.4 10*3/uL (ref 0.1–1.0)
Monocytes Relative: 8.8 % (ref 3.0–12.0)
Neutro Abs: 1.9 10*3/uL (ref 1.4–7.7)
Neutrophils Relative %: 43.9 % (ref 43.0–77.0)
Platelets: 238 10*3/uL (ref 150.0–400.0)
RBC: 4.84 Mil/uL (ref 4.22–5.81)
RDW: 14.1 % (ref 11.5–15.5)
WBC: 4.2 10*3/uL (ref 4.0–10.5)

## 2022-05-24 LAB — VITAMIN D 25 HYDROXY (VIT D DEFICIENCY, FRACTURES): VITD: 47.92 ng/mL (ref 30.00–100.00)

## 2022-05-24 NOTE — Assessment & Plan Note (Signed)
Elevation noted.  Advised to suspend NSAID use and follow BP

## 2022-05-24 NOTE — Assessment & Plan Note (Signed)
Coronary calcium CT score was low at 14

## 2022-05-24 NOTE — Patient Instructions (Addendum)
Suspend  the meloxicam  for 48 hours and check BP at home  [facility] to  use 2000 mg tylenol  every 12 hours during the suspension   The book I referred to today when we were discussing your GI issues is Dr Claris Pong Bennett's book "AIP Diet for Beginners: A Comprehensive Guide to the Autoimmune Paleo protocol"     For the folliculitis:  Use Hibiclens once a week to treat Staph Aureus  Daily soap should be Dial, or Lever 2000 (antibacterial)

## 2022-05-24 NOTE — Progress Notes (Signed)
Subjective:  Patient ID: Ronald Cordova, male    DOB: Jul 03, 1966  Age: 56 y.o. MRN: [id number]  CC: The primary encounter diagnosis was Mixed hyperlipidemia. Diagnoses of Low testosterone in male, Essential hypertension, Palpitations, Vitamin D deficiency, Need for immunization against influenza, Anxiety state, and Chest pain, unspecified type were also pertinent to this visit.   HPI Ronald Cordova presents for FOLLOW  UP ON HYPERTENSION , GAD,  low testosterone and BPH Chief Complaint  Patient presents with   Follow-up    6 month follow up   1) HTN:  home readings have been elevated for the past week. Taking losartan/hct 100/12.5  and cardura 2 mg daily.  Patient attributes the blood pressure elevation to increased  anxiety secondary to mother's development of agitation.  He has also been taking 7.5 mg meloxicam fpr shoulder pain   2) GAD:  symptoms have been worse lately.  Had chest pain last night,  increased his clonazepam to full tablet  last night which helped.  Mother has dementia , has become violent and needs placement .  Her regular doctor is Hande .    3) left shoulder ,  right elbow pain:  [facility] has given him a steroid shot in shoulder and prescribed a wide band for right elbow and meloxicam . Shoulder feeling better .  Diagnosed with a tendon tear by MRI that he feels happened during his care for his mother who weighs 180 lbs   4) s/p sinus surgery in August .   5) reviewed ER visit post operatively for chest tightness,  dyspnea  on August 11.  Chest x ray and EKG normal.  6) persistent folliculitis involving his back since his sinus surgery .  Treated by dermatology with doxycycline , last dose 3 weeks ago.     Outpatient Medications Prior to Visit  Medication Sig Dispense Refill   clonazePAM (KLONOPIN) 1 MG tablet TAKE 1 TABLET (1 MG TOTAL) BY MOUTH DAILY. AS NEEDED FOR ANXIETY 30 tablet 1   doxazosin (CARDURA) 2 MG tablet TAKE 1 TABLET DAILY 90 tablet 1    ergocalciferol (DRISDOL) 1.25 MG (50000 UT) capsule Take 1 capsule (50,000 Units total) by mouth once a week. 12 capsule 0   ezetimibe (ZETIA) 10 MG tablet TAKE 1 TABLET DAILY 90 tablet 2   famotidine (PEPCID) 40 MG tablet Take 40 mg by mouth 2 (two) times daily as needed for heartburn or indigestion.     fluticasone (FLONASE) 50 MCG/ACT nasal spray Place 2 sprays into both nostrils daily. 16 g 0   lansoprazole (PREVACID) 30 MG capsule TAKE 1 CAPSULE (30 MG TOTAL) BY MOUTH DAILY AT 12 NOON. 90 capsule 1   levofloxacin (LEVAQUIN) 500 MG tablet Take 500 mg by mouth daily.     losartan-hydrochlorothiazide (HYZAAR) 100-12.5 MG tablet TAKE 1 TABLET DAILY 90 tablet 1   nystatin cream (MYCOSTATIN) APPLY TO AFFECTED AREA TWICE A DAY 30 g 0   ondansetron (ZOFRAN ODT) 4 MG disintegrating tablet Take 1 tablet (4 mg total) by mouth every 8 (eight) hours as needed for nausea or vomiting. 20 tablet 0   RETIN-A 0.01 % gel      rosuvastatin (CRESTOR) 5 MG tablet TAKE 1 TABLET DAILY 90 tablet 1   sildenafil (REVATIO) 20 MG tablet TAKE 3 TO 5 TABLETS BY MOUTH DAILY AS NEEDED 90 tablet 0   Spacer/Aero-Holding Chambers (AEROCHAMBER PLUS) inhaler Use with inhaler 1 each 2   Syringe/Needle, Disp, (SYRINGE 3CC/18GX1-1/2") 18G  X 1-1/2" 3 ML MISC Use to draw up testosterone 10 each 0   Syringe/Needle, Disp, (SYRINGE 3CC/22GX1-1/2") 22G X 1-1/2" 3 ML MISC Use to administer testosterone  every 2 weeks 10 each 5   Syringe/Needle, Disp, (SYRINGE 3CC/25GX1") 25G X 1" 3 ML MISC Use for b12 injections 50 each 0   testosterone cypionate (DEPOTESTOSTERONE CYPIONATE) 200 MG/ML injection Inject 0.75 mLs (150 mg total) into the muscle every 14 (fourteen) days. 10 mL 0   tiZANidine (ZANAFLEX) 4 MG tablet Take 1 tablet (4 mg total) by mouth every 8 (eight) hours as needed for muscle spasms. 60 tablet 5   traMADol (ULTRAM) 50 MG tablet TAKE 1 TABLET BY MOUTH EVERY 6 HOURS AS NEEDED. 120 tablet 5   triamcinolone ointment (KENALOG) 0.1 %  Apply 1 application topically 2 (two) times daily.     predniSONE (STERAPRED UNI-PAK 21 TAB) 10 MG (21) TBPK tablet Sterapred DS 6 day taper. Take as directed. (Patient not taking: Reported on 05/24/2022) 21 tablet 0   No facility-administered medications prior to visit.    Review of Systems;  Patient denies headache, fevers, malaise, unintentional weight loss, skin rash, eye pain, sinus congestion and sinus pain, sore throat, dysphagia,  hemoptysis , cough, dyspnea, wheezing, chest pain, palpitations, orthopnea, edema, abdominal pain, nausea, melena, diarrhea, constipation, flank pain, dysuria, hematuria, urinary  Frequency, nocturia, numbness, tingling, seizures,  Focal weakness, Loss of consciousness,  Tremor, insomnia, depression, anxiety, and suicidal ideation.      Objective:  BP (!) 142/84 (BP Location: Left Arm, Patient Position: Sitting, Cuff Size: Normal)   Pulse 85   Temp (!) 97.5 F (36.4 C) (Oral)   Ht 5\' 9"  (1.753 m)   Wt 223 lb 3.2 oz (101.2 kg)   SpO2 99%   BMI 32.96 kg/m   BP Readings from Last 3 Encounters:  05/24/22 (!) 142/84  03/27/22 128/79  03/26/22 (!) 144/80    Wt Readings from Last 3 Encounters:  05/24/22 223 lb 3.2 oz (101.2 kg)  03/26/22 227 lb 1.2 oz (103 kg)  03/26/22 225 lb (102.1 kg)    General appearance: alert, cooperative and appears stated age Ears: normal TM's and external ear canals both ears Throat: lips, mucosa, and tongue normal; teeth and gums normal Neck: no adenopathy, no carotid bruit, supple, symmetrical, trachea midline and thyroid not enlarged, symmetric, no tenderness/mass/nodules Back: symmetric, no curvature. ROM normal. No CVA tenderness. Lungs: clear to auscultation bilaterally Heart: regular rate and rhythm, S1, S2 normal, no murmur, click, rub or gallop Abdomen: soft, non-tender; bowel sounds normal; no masses,  no organomegaly Pulses: 2+ and symmetric Skin: Skin color, texture, turgor normal. No rashes or  lesions Lymph nodes: Cervical, supraclavicular, and axillary nodes normal. Neuro:  awake and interactive with normal mood and affect. Higher cortical functions are normal. Speech is clear without word-finding difficulty or dysarthria. Extraocular movements are intact. Visual fields of both eyes are grossly intact. Sensation to light touch is grossly intact bilaterally of upper and lower extremities. Motor examination shows 4+/5 symmetric hand grip and upper extremity and 5/5 lower extremity strength. There is no pronation or drift. Gait is non-ataxic   Lab Results  Component Value Date   HGBA1C 5.7 11/16/2021   HGBA1C 5.7 03/16/2021   HGBA1C 5.3 12/03/2019    Lab Results  Component Value Date   CREATININE 0.87 05/24/2022   CREATININE 0.76 03/26/2022   CREATININE 1.05 02/08/2022    Lab Results  Component Value Date   WBC 4.2  05/24/2022   HGB 14.1 05/24/2022   HCT 43.1 05/24/2022   PLT 238.0 05/24/2022   GLUCOSE 100 (H) 05/24/2022   CHOL 97 05/24/2022   TRIG 89.0 05/24/2022   HDL 33.90 (L) 05/24/2022   LDLDIRECT 83.0 02/08/2022   LDLCALC 45 05/24/2022   ALT 20 05/24/2022   AST 17 05/24/2022   NA 141 05/24/2022   K 4.3 05/24/2022   CL 106 05/24/2022   CREATININE 0.87 05/24/2022   BUN 14 05/24/2022   CO2 27 05/24/2022   TSH 0.88 07/21/2020   PSA 0.57 02/08/2022   HGBA1C 5.7 11/16/2021   MICROALBUR <0.7 12/03/2019    NM Hepato W/EF  Result Date: 04/22/2022 CLINICAL DATA:  Right upper quadrant abdominal pain x7 years. EXAM: NUCLEAR MEDICINE HEPATOBILIARY IMAGING WITH GALLBLADDER EF TECHNIQUE: Sequential images of the abdomen were obtained out to 60 minutes following intravenous administration of radiopharmaceutical. After oral ingestion of Ensure, gallbladder ejection fraction was determined. At 60 min, normal ejection fraction is greater than 33%. RADIOPHARMACEUTICALS:  5.1 mCi Tc-28m  Choletec IV COMPARISON:  Ultrasound April 22, 2022 FINDINGS: Prompt uptake and biliary  excretion of activity by the liver is seen. Gallbladder activity is visualized, consistent with patency of cystic duct. Biliary activity passes into small bowel, consistent with patent common bile duct. Calculated gallbladder ejection fraction is 62%. (Normal gallbladder ejection fraction with Ensure is greater than 33%.) IMPRESSION: 1.  Patent cystic and common bile ducts. 2.  Normal gallbladder ejection fraction. Electronically Signed   By: Maudry Mayhew M.D.   On: 04/22/2022 16:04   US Abdomen Limited RUQ (LIVER/GB)  Result Date: 04/22/2022 CLINICAL DATA:  Right upper quadrant pain EXAM: ULTRASOUND ABDOMEN LIMITED RIGHT UPPER QUADRANT COMPARISON:  None Available. FINDINGS: Gallbladder: No gallstones or wall thickening visualized. No sonographic Murphy sign noted by sonographer. Common bile duct: Diameter: 3 mm Liver: Increased echogenicity. No focal lesion. Portal vein is patent on color Doppler imaging with normal direction of blood flow towards the liver. Other: None. IMPRESSION: Increased hepatic parenchymal echogenicity suggestive of steatosis. No cholelithiasis or sonographic evidence for acute cholecystitis. Electronically Signed   By: Annia Belt M.D.   On: 04/22/2022 09:44    Assessment & Plan:   Problem List Items Addressed This Visit     Anxiety state     his anxiety is managed with prn clonazepam, not used while driving.  Medications discussed,  Risks and benefits of continued use outlined.  Refills given.       Mixed hyperlipidemia - Primary    Managed with zetia and rosuvastatin   Lab Results  Component Value Date   CHOL 97 05/24/2022   HDL 33.90 (L) 05/24/2022   LDLCALC 45 05/24/2022   LDLDIRECT 83.0 02/08/2022   TRIG 89.0 05/24/2022   CHOLHDL 3 05/24/2022         Relevant Orders   Lipid panel (Completed)   Chest pain    Coronary calcium CT score was low at 14      Essential hypertension    Elevation noted.  Advised to suspend NSAID use and follow BP       Relevant Orders   Comprehensive metabolic panel (Completed)   Palpitations   Relevant Orders   CBC with Differential/Platelet (Completed)   Vitamin D deficiency   Relevant Orders   VITAMIN D 25 Hydroxy (Vit-D Deficiency, Fractures) (Completed)   Low testosterone in male    Dose was increased in July for low level  To 150 mcg every 2 weeks  and repeat level is due today       Relevant Orders   Testosterone, Free, Direct   Other Visit Diagnoses     Need for immunization against influenza       Relevant Orders   Flu Vaccine QUAD 50mo+IM (Fluarix, Fluzone & Alfiuria Quad PF) (Completed)       I spent a total of  30 minutes with this patient in a face to face visit on the date of this encounter reviewing the last office visit with me in       ,  most recent visit with cardiology ,    ,  patient's diet and exercise habits, home blood pressure /blod sugar readings, recent ER visit including labs and imaging studies ,   and post visit ordering of testing and therapeutics.    Follow-up: Return in about 6 months (around 11/23/2022).   Sherlene Shams, MD

## 2022-05-24 NOTE — Assessment & Plan Note (Signed)
Managed with zetia and rosuvastatin   Lab Results  Component Value Date   CHOL 97 05/24/2022   HDL 33.90 (L) 05/24/2022   LDLCALC 45 05/24/2022   LDLDIRECT 83.0 02/08/2022   TRIG 89.0 05/24/2022   CHOLHDL 3 05/24/2022

## 2022-05-24 NOTE — Assessment & Plan Note (Addendum)
Dose was increased in July for low level  To 150 mcg every 2 weeks and repeat level is due today

## 2022-05-24 NOTE — Assessment & Plan Note (Signed)
his anxiety is managed with prn clonazepam, not used while driving.  Medications discussed,  Risks and benefits of continued use outlined.  Refills given.

## 2022-05-27 ENCOUNTER — Other Ambulatory Visit: Payer: Self-pay | Admitting: Internal Medicine

## 2022-05-27 DIAGNOSIS — R7989 Other specified abnormal findings of blood chemistry: Secondary | ICD-10-CM

## 2022-05-27 MED ORDER — TESTOSTERONE CYPIONATE 200 MG/ML IM SOLN: 200.0000 mg | INTRAMUSCULAR | 0 refills | Status: DC

## 2022-05-28 ENCOUNTER — Other Ambulatory Visit: Payer: Self-pay | Admitting: Internal Medicine

## 2022-05-30 LAB — TESTOSTERONE, FREE, DIRECT
Testosterone, Free: 2.7 pg/mL — ABNORMAL LOW (ref 7.2–24.0)
Testosterone, Total, LC/MS: 222.6 ng/dL — ABNORMAL LOW (ref 264.0–916.0)

## 2022-06-08 ENCOUNTER — Other Ambulatory Visit: Payer: Self-pay | Admitting: Internal Medicine

## 2022-06-09 NOTE — Telephone Encounter (Signed)
Refilled: 02/01/2022 Last OV: 05/24/2022 Next OV: 11/29/2022

## 2022-06-22 ENCOUNTER — Telehealth: Payer: Self-pay

## 2022-06-22 MED ORDER — CLONAZEPAM 1 MG PO TABS: 1.0000 mg | ORAL_TABLET | Freq: Two times a day (BID) | ORAL | 0 refills | Status: DC | PRN

## 2022-06-22 NOTE — Addendum Note (Signed)
Addended by: Crecencio Mc on: 06/22/2022 04:51 PM   Modules accepted: Orders

## 2022-06-22 NOTE — Telephone Encounter (Signed)
Pt called stating his mom passed away on last week and he would like a work note to cover him being out for a few more days. He would also like to increase his medication just to help he get through this.   Pt would like a call back if needed for more information on note and medication.

## 2022-07-03 ENCOUNTER — Other Ambulatory Visit: Payer: Self-pay | Admitting: Internal Medicine

## 2022-08-17 ENCOUNTER — Telehealth: Payer: Self-pay | Admitting: Internal Medicine

## 2022-08-17 NOTE — Telephone Encounter (Signed)
Patient dropped FMLA paper work to be filled out by Dr Derrel Nip. Paper work is upfront in [employer].

## 2022-08-17 NOTE — Telephone Encounter (Signed)
Placed in red folder  

## 2022-08-18 NOTE — Telephone Encounter (Signed)
Ronald Cordova stated that pt would like the FMLA paperwork filled out due to the recent death of his mother and 56 year old dog.

## 2022-08-19 NOTE — Telephone Encounter (Signed)
Spoke with pt and scheduled him for an appt on Monday to discuss.

## 2022-08-23 ENCOUNTER — Encounter: Payer: Self-pay | Admitting: Internal Medicine

## 2022-08-23 ENCOUNTER — Ambulatory Visit: Payer: BC Managed Care – PPO | Admitting: Internal Medicine

## 2022-08-23 ENCOUNTER — Other Ambulatory Visit: Payer: Self-pay | Admitting: Internal Medicine

## 2022-08-23 VITALS — BP 138/80 | HR 76 | Temp 97.7°F | Ht 69.0 in | Wt 230.2 lb

## 2022-08-23 DIAGNOSIS — F330 Major depressive disorder, recurrent, mild: Secondary | ICD-10-CM

## 2022-08-23 DIAGNOSIS — F411 Generalized anxiety disorder: Secondary | ICD-10-CM

## 2022-08-23 DIAGNOSIS — K580 Irritable bowel syndrome with diarrhea: Secondary | ICD-10-CM | POA: Diagnosis not present

## 2022-08-23 DIAGNOSIS — Z0279 Encounter for issue of other medical certificate: Secondary | ICD-10-CM

## 2022-08-23 DIAGNOSIS — F4329 Adjustment disorder with other symptoms: Secondary | ICD-10-CM | POA: Diagnosis not present

## 2022-08-23 MED ORDER — DIPHENOXYLATE-ATROPINE 2.5-0.025 MG PO TABS: 1.0000 | ORAL_TABLET | Freq: Four times a day (QID) | ORAL | 0 refills | Status: AC | PRN

## 2022-08-23 NOTE — Assessment & Plan Note (Addendum)
his anxiety  is chronic and historically   managed with prn clonazepam, but since the death of his mother and his dog has has had increased frequency of panic attacks and high level of constant anxeity.  he has been using  clonazepam twice daily since losing his mother and his beloved dog. ,  Risks and benefits of continued use outlined.  Refills given.with reduced quantity to begin  weaning the morning dose off so he can return to work on Wednesday.   Follow up monthly  with his counsellor and with me / .

## 2022-08-23 NOTE — Progress Notes (Unsigned)
Subjective:  Patient ID: Ronald Cordova, male    DOB: 1966/01/03  Age: 57 y.o. MRN: [id number]  CC: The primary encounter diagnosis was Anxiety state. Diagnoses of Irritable bowel syndrome with diarrhea and Grief reaction with prolonged bereavement were also pertinent to this visit.   HPI AVANT PRINTY presents for management of grief , recurrent panic attacks and diarrhea  Chief Complaint  Patient presents with   Discuss FMLA paperwork   Ronald Cordova is a 57 yr old male  with a history of  GAD managed with clonazepam who lost his mother on October 31.  He has been unable to work due to  frequent panic attacks and uncontrolled diarrhea and was excused from  work for one week , but his symptoms were complicated by having to  euthanize his dog of 15 yrs 3 weeks ago  He tried to return to work and  worked two days since his mother's passing,  and was unable to perform his duties  due to uncontrolled despondence /crying spells and panic attacks.   He has has seen his  Dr Collene Mares,  his gastroenterologist , (one visit ,  returned for labs)  for management of new onset diarrhea and has been  prescribed Viberzi  and Lomotil.  Has been receiving counselling, by Luiz Blare , and has also added monthly sessions with a grief counsellor from  Hospice  .  Seeing Tamela monthly  and Hospice Counsellor every 3 weeks.   Has been Taking 1/2 clonazepam at night, 1/4 in the morning .  Wants to avoid higher doses,   SSRIs.  Walking daily  with a friend .  He feels the pain of being an "orphaned senior "  ; his father is still alive but estranged because he does not approve of patient's homosexual lifestyle. Patient's brother died unexpectedly 7 years ago in 2017   Outpatient Medications Prior to Visit  Medication Sig Dispense Refill   clonazePAM (KLONOPIN) 1 MG tablet Take 1 tablet (1 mg total) by mouth 2 (two) times daily as needed for anxiety. QTY  INCREASED FOR ONE MONTH ONLY 60 tablet 0   doxazosin (CARDURA) 2 MG  tablet TAKE 1 TABLET DAILY 90 tablet 1   Eluxadoline (VIBERZI) 100 MG TABS Take 2 tablets by mouth daily.     ezetimibe (ZETIA) 10 MG tablet TAKE 1 TABLET DAILY 90 tablet 2   famotidine (PEPCID) 40 MG tablet Take 40 mg by mouth 2 (two) times daily as needed for heartburn or indigestion.     fluticasone (FLONASE) 50 MCG/ACT nasal spray Place 2 sprays into both nostrils daily. 16 g 0   lansoprazole (PREVACID) 30 MG capsule TAKE 1 CAPSULE (30 MG TOTAL) BY MOUTH DAILY AT 12 NOON. 90 capsule 1   losartan-hydrochlorothiazide (HYZAAR) 100-12.5 MG tablet TAKE 1 TABLET DAILY 90 tablet 1   nystatin cream (MYCOSTATIN) APPLY TO AFFECTED AREA TWICE A DAY 30 g 0   ondansetron (ZOFRAN ODT) 4 MG disintegrating tablet Take 1 tablet (4 mg total) by mouth every 8 (eight) hours as needed for nausea or vomiting. 20 tablet 0   RETIN-A 0.01 % gel      rosuvastatin (CRESTOR) 5 MG tablet TAKE 1 TABLET DAILY 90 tablet 1   sildenafil (REVATIO) 20 MG tablet TAKE 3 TO 5 TABLETS BY MOUTH DAILY AS NEEDED 90 tablet 0   Spacer/Aero-Holding Chambers (AEROCHAMBER PLUS) inhaler Use with inhaler 1 each 2   Syringe/Needle, Disp, (SYRINGE 3CC/18GX1-1/2") 18G X 1-1/2" 3  ML MISC Use to draw up testosterone 10 each 0   Syringe/Needle, Disp, (SYRINGE 3CC/22GX1-1/2") 22G X 1-1/2" 3 ML MISC Use to administer testosterone  every 2 weeks 10 each 5   Syringe/Needle, Disp, (SYRINGE 3CC/25GX1") 25G X 1" 3 ML MISC Use for b12 injections 50 each 0   testosterone cypionate (DEPOTESTOSTERONE CYPIONATE) 200 MG/ML injection Inject 1 mL (200 mg total) into the muscle every 14 (fourteen) days. 10 mL 0   tiZANidine (ZANAFLEX) 4 MG tablet Take 1 tablet (4 mg total) by mouth every 8 (eight) hours as needed for muscle spasms. 60 tablet 5   traMADol (ULTRAM) 50 MG tablet TAKE 1 TABLET BY MOUTH EVERY 6 HOURS AS NEEDED. 120 tablet 5   triamcinolone ointment (KENALOG) 0.1 % Apply 1 application topically 2 (two) times daily.     Vitamin D, Ergocalciferol,  (DRISDOL) 1.25 MG (50000 UNIT) CAPS capsule TAKE 1 CAPSULE ONCE WEEKLY 12 capsule 0   levofloxacin (LEVAQUIN) 500 MG tablet Take 500 mg by mouth daily. (Patient not taking: Reported on 08/23/2022)     No facility-administered medications prior to visit.    Review of Systems;  Patient denies headache, fevers, malaise, unintentional weight loss, skin rash, eye pain, sinus congestion and sinus pain, sore throat, dysphagia,  hemoptysis , cough, dyspnea, wheezing, chest pain, palpitations, orthopnea, edema, abdominal pain, nausea, melena, diarrhea, constipation, flank pain, dysuria, hematuria, urinary  Frequency, nocturia, numbness, tingling, seizures,  Focal weakness, Loss of consciousness,  Tremor, insomnia, depression, anxiety, and suicidal ideation.      Objective:  BP 138/80   Pulse 76   Temp 97.7 F (36.5 C) (Oral)   Ht 5\' 9"  (1.753 m)   Wt 230 lb 3.2 oz (104.4 kg)   SpO2 97%   BMI 33.99 kg/m   BP Readings from Last 3 Encounters:  08/23/22 138/80  05/24/22 (!) 142/84  03/27/22 128/79    Wt Readings from Last 3 Encounters:  08/23/22 230 lb 3.2 oz (104.4 kg)  05/24/22 223 lb 3.2 oz (101.2 kg)  03/26/22 227 lb 1.2 oz (103 kg)    Physical Exam  Lab Results  Component Value Date   HGBA1C 5.7 11/16/2021   HGBA1C 5.7 03/16/2021   HGBA1C 5.3 12/03/2019    Lab Results  Component Value Date   CREATININE 0.87 05/24/2022   CREATININE 0.76 03/26/2022   CREATININE 1.05 02/08/2022    Lab Results  Component Value Date   WBC 4.2 05/24/2022   HGB 14.1 05/24/2022   HCT 43.1 05/24/2022   PLT 238.0 05/24/2022   GLUCOSE 100 (H) 05/24/2022   CHOL 97 05/24/2022   TRIG 89.0 05/24/2022   HDL 33.90 (L) 05/24/2022   LDLDIRECT 83.0 02/08/2022   LDLCALC 45 05/24/2022   ALT 20 05/24/2022   AST 17 05/24/2022   NA 141 05/24/2022   K 4.3 05/24/2022   CL 106 05/24/2022   CREATININE 0.87 05/24/2022   BUN 14 05/24/2022   CO2 27 05/24/2022   TSH 0.88 07/21/2020   PSA 0.57 02/08/2022    HGBA1C 5.7 11/16/2021   MICROALBUR <0.7 12/03/2019    NM Hepato W/EF  Result Date: 04/22/2022 CLINICAL DATA:  Right upper quadrant abdominal pain x7 years. EXAM: NUCLEAR MEDICINE HEPATOBILIARY IMAGING WITH GALLBLADDER EF TECHNIQUE: Sequential images of the abdomen were obtained out to 60 minutes following intravenous administration of radiopharmaceutical. After oral ingestion of Ensure, gallbladder ejection fraction was determined. At 60 min, normal ejection fraction is greater than 33%. RADIOPHARMACEUTICALS:  5.1 mCi Tc-21m  Choletec IV COMPARISON:  Ultrasound April 22, 2022 FINDINGS: Prompt uptake and biliary excretion of activity by the liver is seen. Gallbladder activity is visualized, consistent with patency of cystic duct. Biliary activity passes into small bowel, consistent with patent common bile duct. Calculated gallbladder ejection fraction is 62%. (Normal gallbladder ejection fraction with Ensure is greater than 33%.) IMPRESSION: 1.  Patent cystic and common bile ducts. 2.  Normal gallbladder ejection fraction. Electronically Signed   By: Maudry Mayhew M.D.   On: 04/22/2022 16:04   US Abdomen Limited RUQ (LIVER/GB)  Result Date: 04/22/2022 CLINICAL DATA:  Right upper quadrant pain EXAM: ULTRASOUND ABDOMEN LIMITED RIGHT UPPER QUADRANT COMPARISON:  None Available. FINDINGS: Gallbladder: No gallstones or wall thickening visualized. No sonographic Murphy sign noted by sonographer. Common bile duct: Diameter: 3 mm Liver: Increased echogenicity. No focal lesion. Portal vein is patent on color Doppler imaging with normal direction of blood flow towards the liver. Other: None. IMPRESSION: Increased hepatic parenchymal echogenicity suggestive of steatosis. No cholelithiasis or sonographic evidence for acute cholecystitis. Electronically Signed   By: Annia Belt M.D.   On: 04/22/2022 09:44    Assessment & Plan:  .Anxiety state Assessment & Plan:  his anxiety  is chronic and historically    managed with prn clonazepam, although he has been sing it twice daily since losing his mother and his beloved dog. ,  Risks and benefits of continued use outlined.  Refills given. Encouarged to start weaning the morning dose off so he can return to work on Wednesday.    Irritable bowel syndrome with diarrhea Assessment & Plan: Now with uncontrollable diarrhea causing fecal incontinence.  Continue Viberzi , adding lomotil (did not receive from GI)    Grief reaction with prolonged bereavement Assessment & Plan: Continue grief counselling by Hospice and therapy with Tamela for GAD /panic attacks FMLA form signed to cover period of missed work Oct 31 to Jan 9   Other orders -     Diphenoxylate-Atropine; Take 1 tablet by mouth 4 (four) times daily as needed for diarrhea or loose stools.  Dispense: 30 tablet; Refill: 0     I provided 40 minutes of face-to-face time during this encounter reviewing patient's last visit with me,  counseling on currently addressed issues,  and post visit ordering to diagnostics and therapeutics .   Follow-up: No follow-ups on file.   Sherlene Shams, MD

## 2022-08-23 NOTE — Assessment & Plan Note (Signed)
Continue grief counselling by Hospice and therapy with Tamela for GAD /panic attacks FMLA form signed to cover period of missed work Oct 31 to Jan 9

## 2022-08-23 NOTE — Patient Instructions (Signed)
Lomotil called in to [facility] for 30 day supply

## 2022-08-23 NOTE — Telephone Encounter (Signed)
Clonazepam: 06/22/2022 Tramadol: 01/25/2022  Last OV: 08/23/2022 Next OV: 11/29/2022

## 2022-08-23 NOTE — Assessment & Plan Note (Signed)
Now with uncontrollable diarrhea causing fecal incontinence.  Continue Viberzi , adding lomotil (did not receive from GI)

## 2022-08-24 NOTE — Assessment & Plan Note (Signed)
Complicated by grief following the loss of his mother who was very supportive.  He has a poor relationship with father due to father's disapproval of his sexual preferences.  He defers SSRI therapy

## 2022-08-25 NOTE — Telephone Encounter (Signed)
Patient came into the office and signed FMLA paper work. Signed FMLA paper is up front Dr Lupita Dawn color folder.

## 2022-08-25 NOTE — Telephone Encounter (Signed)
Refilled: 05/28/2022 Last OV: 08/23/2022 Next OV: 11/29/2022

## 2022-08-27 NOTE — Telephone Encounter (Signed)
faxed

## 2022-08-31 ENCOUNTER — Telehealth: Payer: Self-pay | Admitting: Internal Medicine

## 2022-08-31 NOTE — Telephone Encounter (Signed)
Placed this back in red folder for corrections and initials.

## 2022-08-31 NOTE — Telephone Encounter (Signed)
Patient called about his FMLA. His work said that page 3, question #1- 06/09/2022 was the first day of leave. Return back to work on 07/30/2022.  Please refax.

## 2022-09-01 NOTE — Telephone Encounter (Signed)
faxed

## 2022-09-21 ENCOUNTER — Other Ambulatory Visit: Payer: Self-pay

## 2022-09-21 MED ORDER — LANSOPRAZOLE 30 MG PO CPDR: 30.0000 mg | DELAYED_RELEASE_CAPSULE | Freq: Every day | ORAL | 3 refills | Status: DC

## 2022-09-27 ENCOUNTER — Other Ambulatory Visit: Payer: Self-pay | Admitting: Internal Medicine

## 2022-09-27 NOTE — Telephone Encounter (Signed)
Requesting: Clonazepam Contract: No UDS: No Last Visit: 08/23/2022 Next Visit: 11/29/2022 Last Refill: 08/23/2022  Please Advise

## 2022-10-06 ENCOUNTER — Encounter: Payer: Self-pay | Admitting: Family

## 2022-10-06 ENCOUNTER — Telehealth: Payer: BC Managed Care – PPO | Admitting: Family

## 2022-10-06 DIAGNOSIS — U071 COVID-19: Secondary | ICD-10-CM | POA: Diagnosis not present

## 2022-10-06 HISTORY — DX: COVID-19: U07.1

## 2022-10-06 MED ORDER — LIDOCAINE VISCOUS HCL 2 % MT SOLN: 15.0000 mL | OROMUCOSAL | 0 refills | Status: DC | PRN

## 2022-10-06 MED ORDER — GUAIFENESIN-CODEINE 100-10 MG/5ML PO SYRP: 5.0000 mL | ORAL_SOLUTION | Freq: Every evening | ORAL | 0 refills | Status: DC | PRN

## 2022-10-06 MED ORDER — NIRMATRELVIR/RITONAVIR (PAXLOVID)TABLET: 3.0000 | ORAL_TABLET | Freq: Two times a day (BID) | ORAL | 0 refills | Status: AC

## 2022-10-06 NOTE — Progress Notes (Signed)
Virtual Visit via Video Note  I connected with Ronald Cordova on 10/11/22 at 10:15 AM EST by a video enabled telemedicine application and verified that I am speaking with the correct person using two identifiers. Location patient: home Location provider: work  Persons participating in the virtual visit: patient, provider  I discussed the limitations of evaluation and management by telemedicine and the availability of in person appointments. The patient expressed understanding and agreed to proceed.  HPI: Covid positive at home. Complains of sinus congestion, clear runny discharge from eyes, sneezing, left sided sore throat, cough, HA, tactile warmth and chills. Symptoms started 4 days ago.  He has tried tylenol and advil .   Eating popsicles and chicken broth.Staying hydrated.   Tessalon perles are not particularly helpful.  No sob, wheezing.   Covid positive yesterday  No ckd  H/o HTN, allergies  Former smoker, quit 2012   ROS: See pertinent positives and negatives per HPI.  EXAM:  VITALS per patient if applicable: There were no vitals taken for this visit. BP Readings from Last 3 Encounters:  10/08/22 128/82  08/23/22 138/80  05/24/22 (!) 142/84   Wt Readings from Last 3 Encounters:  10/08/22 244 lb 12.8 oz (111 kg)  08/23/22 230 lb 3.2 oz (104.4 kg)  05/24/22 223 lb 3.2 oz (101.2 kg)    GENERAL: alert, oriented, appears well and in no acute distress  HEENT: atraumatic, conjunttiva clear, no obvious abnormalities on inspection of external nose and ears  NECK: normal movements of the head and neck  LUNGS: on inspection no signs of respiratory distress, breathing rate appears normal, no obvious gross SOB, gasping or wheezing  CV: no obvious cyanosis  MS: moves all visible extremities without noticeable abnormality  PSYCH/NEURO: pleasant and cooperative, no obvious depression or anxiety, speech and thought processing grossly intact  ASSESSMENT AND  PLAN: COVID-19 Assessment & Plan: No acute respiratory distress.  Counseled on lacking long term safely and effectiveness data of medication, Paxlovid. Explained EUA for Paxlovid. Criteria met for consideration of Paxlovid,  patient older than 12 years and weight > 40kg, started within 5 days of symptom onset and risk factor for severe disease include: HTN  Counseled on drug interaction tween Paxlovid crestor , revatio, and viberzi. Advised to hold crestor , revatio and to decrease viberzi to once day dosing out of caution during course of paxlovid.   GFR > 60.  Counseled on adverse effects including altered taste, diarrhea, HTN, and myalgia.   Patient is most comfortable and desires to start Paxlovid and understands to call me with concerns or new symptoms.  Provided lidocaine swish and follow-up for sore throat and Robitussin AC for cough.    Orders: -     Lidocaine Viscous HCl; Use as directed 15 mLs in the mouth or throat every 3 (three) hours as needed for mouth pain (gargle; may spit or swallow).  Dispense: 100 mL; Refill: 0 -     nirmatrelvir/ritonavir; Take 3 tablets by mouth 2 (two) times daily for 5 days. (Take nirmatrelvir 150 mg two tablets twice daily for 5 days and ritonavir 100 mg one tablet twice daily for 5 days) Patient GFR is 97  Dispense: 30 tablet; Refill: 0 -     guaiFENesin-Codeine; Take 5 mLs by mouth at bedtime as needed for cough.  Dispense: 70 mL; Refill: 0     -we discussed possible serious and likely etiologies, options for evaluation and workup, limitations of telemedicine visit vs in person visit,  treatment, treatment risks and precautions. Pt prefers to treat via telemedicine empirically rather then risking or undertaking an in person visit at this moment.    I discussed the assessment and treatment plan with the patient. The patient was provided an opportunity to ask questions and all were answered. The patient agreed with the plan and demonstrated an  understanding of the instructions.   The patient was advised to call back or seek an in-person evaluation if the symptoms worsen or if the condition fails to improve as anticipated.  Advised if desired AVS can be mailed or viewed via [facility] if [facility] user.   Mable Paris, FNP

## 2022-10-06 NOTE — Assessment & Plan Note (Addendum)
No acute respiratory distress.  Counseled on lacking long term safely and effectiveness data of medication, Paxlovid. Explained EUA for Paxlovid. Criteria met for consideration of Paxlovid,  patient older than 12 years and weight > 40kg, started within 5 days of symptom onset and risk factor for severe disease include: HTN  Counseled on drug interaction tween Paxlovid crestor , revatio, and viberzi. Advised to hold crestor , revatio and to decrease viberzi to once day dosing out of caution during course of paxlovid.   GFR > 60.  Counseled on adverse effects including altered taste, diarrhea, HTN, and myalgia.   Patient is most comfortable and desires to start Paxlovid and understands to call me with concerns or new symptoms.  Provided lidocaine swish and follow-up for sore throat and Robitussin AC for cough.

## 2022-10-08 ENCOUNTER — Encounter: Payer: Self-pay | Admitting: Family

## 2022-10-08 ENCOUNTER — Ambulatory Visit: Payer: BC Managed Care – PPO | Admitting: Family

## 2022-10-08 VITALS — BP 128/82 | HR 79 | Temp 98.8°F | Ht 69.0 in | Wt 244.8 lb

## 2022-10-08 DIAGNOSIS — J029 Acute pharyngitis, unspecified: Secondary | ICD-10-CM | POA: Diagnosis not present

## 2022-10-08 LAB — POCT RAPID STREP A (OFFICE): Rapid Strep A Screen: NEGATIVE

## 2022-10-08 MED ORDER — PREDNISONE 10 MG PO TABS: ORAL_TABLET | ORAL | 0 refills | Status: DC

## 2022-10-08 NOTE — Patient Instructions (Signed)
Start prednisone tomorrow if you continue to have throat pain, swollen glands.  You may also use on occasion anti-inflammatory such as ibuprofen which may be helpful for pain.  Please monitor blood pressure while on ibuprofen.  Very nice to see you again.  Please let me know if symptoms do not improve.

## 2022-10-08 NOTE — Assessment & Plan Note (Addendum)
Afebrile.  Point-of-care strep is negative.  I suspect pharyngitis, lymphadenopathy related to COVID.  He is quite uncomfortable and we agreed prednisone taper is reasonable.  He will start tomorrow as not to interfere with sleep tonight.  Advised judicious use of NSAIDs due to discomfort.  Previously NSAIDs elevated blood pressures  and I have advised him to monitor  BP. He will continue viscous lidocaine.  We opted not to pursue chest x-ray in the absence of adventitious lung sounds.  He will let me know how he is doing.

## 2022-10-08 NOTE — Progress Notes (Signed)
Assessment & Plan:  Pharyngitis, unspecified etiology Assessment & Plan: Afebrile.  Point-of-care strep is negative.  I suspect pharyngitis, lymphadenopathy related to COVID.  He is quite uncomfortable and we agreed prednisone taper is reasonable.  He will start tomorrow as not to interfere with sleep tonight.  Advised judicious use of NSAIDs due to discomfort.  Previously NSAIDs elevated blood pressures  and I have advised him to monitor  BP. He will continue viscous lidocaine.  We opted not to pursue chest x-ray in the absence of adventitious lung sounds.  He will let me know how he is doing.  Orders: -     predniSONE; Take 40 mg by mouth on day 1, then taper 10 mg daily until gone  Dispense: 10 tablet; Refill: 0  Sore throat -     POCT rapid strep A     Return precautions given.   Risks, benefits, and alternatives of the medications and treatment plan prescribed today were discussed, and patient expressed understanding.   Education regarding symptom management and diagnosis given to patient on AVS either electronically or printed.  No follow-ups on file.  Mable Paris, FNP  Subjective:    Patient ID: Ronald Cordova, male    DOB: [birth year], 57 y.o.   MRN: [id number]  CC: HEWITT ALMAND is a 57 y.o. male who presents today for follow up.   HPI: Follow up covid   cough started 6 days ago.  Cough has improved.  No fever  Compliant with Paxlovid , viscous lidocaine and guaifenesin-codeine as started on 10/06/2022  He is here today as he is most bothered by sore throat.  It is painful to swallow.  Primarily left side.  He feels glands are swollen.  No trouble managing secretions, trismus.     He has had relief with viscous lidocaine although it is temporary.  Previously has taken prednisone in the past without side effects.    Allergies: Clindamycin/lincomycin, Penicillin g, and Penicillins Current Outpatient Medications on File Prior to Visit  Medication Sig Dispense  Refill   clonazePAM (KLONOPIN) 1 MG tablet TAKE 1.5 TABLETS (1.5 MG TOTAL) BY MOUTH DAILY AS NEEDED FOR ANXIETY 45 tablet 5   diphenoxylate-atropine (LOMOTIL) 2.5-0.025 MG tablet Take 1 tablet by mouth 4 (four) times daily as needed for diarrhea or loose stools. 30 tablet 0   doxazosin (CARDURA) 2 MG tablet TAKE 1 TABLET DAILY 90 tablet 1   Eluxadoline (VIBERZI) 100 MG TABS Take 2 tablets by mouth daily.     ezetimibe (ZETIA) 10 MG tablet TAKE 1 TABLET DAILY 90 tablet 2   famotidine (PEPCID) 40 MG tablet Take 40 mg by mouth 2 (two) times daily as needed for heartburn or indigestion.     fluticasone (FLONASE) 50 MCG/ACT nasal spray Place 2 sprays into both nostrils daily. 16 g 0   guaiFENesin-codeine (ROBITUSSIN AC) 100-10 MG/5ML syrup Take 5 mLs by mouth at bedtime as needed for cough. 70 mL 0   lansoprazole (PREVACID) 30 MG capsule Take 1 capsule (30 mg total) by mouth daily at 12 noon. 90 capsule 3   lidocaine (XYLOCAINE) 2 % solution Use as directed 15 mLs in the mouth or throat every 3 (three) hours as needed for mouth pain (gargle; may spit or swallow). 100 mL 0   losartan-hydrochlorothiazide (HYZAAR) 100-12.5 MG tablet TAKE 1 TABLET DAILY 90 tablet 1   nirmatrelvir/ritonavir (PAXLOVID) 20 x 150 MG & 10 x '100MG'$  TABS Take 3 tablets by mouth 2 (two) times  daily for 5 days. (Take nirmatrelvir 150 mg two tablets twice daily for 5 days and ritonavir 100 mg one tablet twice daily for 5 days) Patient GFR is 97 30 tablet 0   nystatin cream (MYCOSTATIN) APPLY TO AFFECTED AREA TWICE A DAY 30 g 0   ondansetron (ZOFRAN ODT) 4 MG disintegrating tablet Take 1 tablet (4 mg total) by mouth every 8 (eight) hours as needed for nausea or vomiting. 20 tablet 0   RETIN-A 0.01 % gel      rosuvastatin (CRESTOR) 5 MG tablet TAKE 1 TABLET DAILY 90 tablet 1   sildenafil (REVATIO) 20 MG tablet TAKE 3 TO 5 TABLETS BY MOUTH DAILY AS NEEDED 90 tablet 0   Spacer/Aero-Holding Chambers (AEROCHAMBER PLUS) inhaler Use with  inhaler 1 each 2   Syringe/Needle, Disp, (SYRINGE 3CC/18GX1-1/2") 18G X 1-1/2" 3 ML MISC Use to draw up testosterone 10 each 0   Syringe/Needle, Disp, (SYRINGE 3CC/22GX1-1/2") 22G X 1-1/2" 3 ML MISC Use to administer testosterone  every 2 weeks 10 each 5   Syringe/Needle, Disp, (SYRINGE 3CC/25GX1") 25G X 1" 3 ML MISC Use for b12 injections 50 each 0   testosterone cypionate (DEPOTESTOSTERONE CYPIONATE) 200 MG/ML injection Inject 1 mL (200 mg total) into the muscle every 14 (fourteen) days. 10 mL 0   tiZANidine (ZANAFLEX) 4 MG tablet Take 1 tablet (4 mg total) by mouth every 8 (eight) hours as needed for muscle spasms. 60 tablet 5   traMADol (ULTRAM) 50 MG tablet Take 1 tablet (50 mg total) by mouth every 12 (twelve) hours as needed for moderate pain. 60 tablet 2   triamcinolone ointment (KENALOG) 0.1 % Apply 1 application topically 2 (two) times daily.     Vitamin D, Ergocalciferol, (DRISDOL) 1.25 MG (50000 UNIT) CAPS capsule TAKE 1 CAPSULE ONCE WEEKLY 12 capsule 0   No current facility-administered medications on file prior to visit.    Review of Systems  Constitutional:  Negative for chills and fever.  HENT:  Positive for sore throat. Negative for congestion.   Respiratory:  Negative for cough and shortness of breath.   Cardiovascular:  Negative for chest pain and palpitations.  Gastrointestinal:  Negative for nausea and vomiting.      Objective:    BP 128/82   Pulse 79   Temp 98.8 F (37.1 C) (Oral)   Ht '5\' 9"'$  (1.753 m)   Wt 244 lb 12.8 oz (111 kg)   SpO2 98%   BMI 36.15 kg/m  BP Readings from Last 3 Encounters:  10/08/22 128/82  08/23/22 138/80  05/24/22 (!) 142/84   Wt Readings from Last 3 Encounters:  10/08/22 244 lb 12.8 oz (111 kg)  08/23/22 230 lb 3.2 oz (104.4 kg)  05/24/22 223 lb 3.2 oz (101.2 kg)    Physical Exam Vitals reviewed.  Constitutional:      Appearance: He is well-developed.  HENT:     Head: Normocephalic and atraumatic.     Right Ear: Hearing,  tympanic membrane, ear canal and external ear normal. No decreased hearing noted. No drainage, swelling or tenderness. No middle ear effusion. Tympanic membrane is not injected, erythematous or bulging.     Left Ear: Hearing, tympanic membrane, ear canal and external ear normal. No decreased hearing noted. No drainage, swelling or tenderness.  No middle ear effusion. Tympanic membrane is not injected, erythematous or bulging.     Nose: Nose normal.     Right Sinus: No maxillary sinus tenderness or frontal sinus tenderness.  Left Sinus: No maxillary sinus tenderness or frontal sinus tenderness.     Mouth/Throat:     Pharynx: Uvula midline. Posterior oropharyngeal erythema present. No oropharyngeal exudate.     Tonsils: No tonsillar abscesses.     Comments: Tonsils well behind pillars bilaterally.  No white exudate. no hot potato voice Eyes:     Conjunctiva/sclera: Conjunctivae normal.  Cardiovascular:     Rate and Rhythm: Regular rhythm.     Heart sounds: Normal heart sounds.  Pulmonary:     Effort: Pulmonary effort is normal. No respiratory distress.     Breath sounds: Normal breath sounds. No wheezing, rhonchi or rales.  Lymphadenopathy:     Head:     Right side of head: Submental adenopathy present. No submandibular, tonsillar, preauricular, posterior auricular or occipital adenopathy.     Left side of head: Submental adenopathy present. No submandibular, tonsillar, preauricular, posterior auricular or occipital adenopathy.     Cervical: No cervical adenopathy.     Comments: Bilateral symmetrical slightly enlarged submental lymph nodes.  Skin:    General: Skin is warm and dry.  Neurological:     Mental Status: He is alert.  Psychiatric:        Speech: Speech normal.        Behavior: Behavior normal.

## 2022-10-11 NOTE — Patient Instructions (Addendum)
Codeine based syrup. Please take cough medication at night only as needed. As we discussed, I do not recommend dosing throughout the day as coughing is a protective mechanism . It also helps to break up thick mucous.  Do not take cough suppressants with alcohol as can lead to trouble breathing. Advise caution if taking cough suppressant and operating machinery ( i.e driving a car) as you may feel very tired.    Lidocaine swish and swallow.  Please stay in quarantine per cdc guidelines.   If you test positive for COVID-19, stay home for at least 5 days and isolate from others in your home. You are likely most infectious during these first 5 days. Wear a high-quality mask if you must be around others at home and in public. Do not go places where you are unable to wear a mask.  We discussed starting Paxlovid which is an unapproved drug that is authorized for use under an Emergency Use Authorization.  There are no adequate, approved, available products for the treatment of COVID-19 in adults who have mild-to-moderate COVID-19 and are at high risk for progressing to severe COVID-19, including hospitalization or death.  There are benefits and risks of taking this treatment as outlined in the "Fact Sheet for Patients and Caregivers." You may find this document here and please read in detail   [email or web address]   I have sent Paxlovid to your pharmacy. Please call pharmacy so they bring medication out to your car and you do not have to go inside.   PAXLOVID ADMINISTRATION INSTRUCTIONS:  Take with or without food. Swallow the tablets whole. Don't chew, crush, or break the medications because it might not work as well  For each dose of the medication, you should be taking 3 tablets together (2 pink oval and 1 white oval) TWICE a day for FIVE days   Finish your full five-day course of Paxlovid even if you feel better before you're done. Stopping this medication too early can make  it less effective to prevent severe illness related to [place].    Paxlovid is prescribed for YOU ONLY. Don't share it with others, even if they have similar symptoms as you. This medication might not be right for everyone.  Make sure to take steps to protect yourself and others while you're taking this medication in order to get well soon and to prevent others from getting sick with COVID-19.  Paxlovid (nirmatrelvir / ritonavir) can cause hormonal birth control medications to not work well. If you or your partner is currently taking hormonal birth control, use condoms or other birth control methods to prevent unintended pregnancies.   COMMON SIDE EFFECTS: Altered or bad taste in your mouth  Diarrhea  High blood pressure (1% of people) Muscle aches (1% of people)    If your COVID-19 symptoms get worse, get medical help right away. Call 911 if you experience symptoms such as worsening cough, trouble breathing, chest pain that doesn't go away, confusion, a hard time staying awake, and pale or blue-colored skin.This medication won't prevent all COVID-19 cases from getting worse.

## 2022-10-23 ENCOUNTER — Other Ambulatory Visit: Payer: Self-pay | Admitting: Internal Medicine

## 2022-11-10 ENCOUNTER — Ambulatory Visit: Payer: BC Managed Care – PPO | Admitting: Family

## 2022-11-10 ENCOUNTER — Encounter: Payer: Self-pay | Admitting: Family

## 2022-11-10 VITALS — BP 118/80 | HR 81 | Temp 98.5°F | Ht 69.0 in | Wt 242.8 lb

## 2022-11-10 DIAGNOSIS — R066 Hiccough: Secondary | ICD-10-CM

## 2022-11-10 NOTE — Progress Notes (Signed)
Assessment & Plan:  Hiccup Assessment & Plan: Scheduled EGD/ colonoscopy 01/2023 Dr Collene Mares EGD 12/03/2015 normal.  Question if dysphagia playing a role as he does have trouble swallowing pills from time to time.  No history of malignancy.  He is not using a medication that I know of which would cause hiccups.   advised to continue Prevacid 30 mg every morning.  As has been taking Pepcid 20 mg as needed, advised him to take Pepcid 20 mg nightly and advance to Pepcid 20 mg twice daily if needed.  If ineffective, consider moving up EGD/ colonoscopy. May consider baclofen.  He has follow-up with PCP 11/29/22.      Return precautions given.   Risks, benefits, and alternatives of the medications and treatment plan prescribed today were discussed, and patient expressed understanding.   Education regarding symptom management and diagnosis given to patient on AVS either electronically or printed.  No follow-ups on file.  Mable Paris, FNP  Subjective:    Patient ID: Ronald Cordova, male    DOB: November 29, 1965, 57 y.o.   MRN: [id number]  CC: Ronald Cordova is a 57 y.o. male who presents today for an acute visit.    HPI: Complains of hiccups x 3 days ago  He describes hiccups for 3 days, then resolves on their own. This has been ongoing for weeks.   He drinks a cold protein shake without relief He occasionally has trouble swallowing.   No cough, fever, cp.    Following with Dr Collene Mares whom started viberzi.   He takes prevacid 30mg  qam. He will take pepcid 40mg  prn.  Scheduled EGD/ colonoscopy EGD 12/03/2015 normal.   Allergies: Clindamycin/lincomycin, Penicillin g, and Penicillins Current Outpatient Medications on File Prior to Visit  Medication Sig Dispense Refill   clonazePAM (KLONOPIN) 1 MG tablet TAKE 1.5 TABLETS (1.5 MG TOTAL) BY MOUTH DAILY AS NEEDED FOR ANXIETY 45 tablet 5   diphenoxylate-atropine (LOMOTIL) 2.5-0.025 MG tablet Take 1 tablet by mouth 4 (four) times daily as needed  for diarrhea or loose stools. 30 tablet 0   doxazosin (CARDURA) 2 MG tablet TAKE 1 TABLET DAILY 90 tablet 1   Eluxadoline (VIBERZI) 100 MG TABS Take 2 tablets by mouth daily.     ezetimibe (ZETIA) 10 MG tablet TAKE 1 TABLET DAILY 90 tablet 2   famotidine (PEPCID) 40 MG tablet Take 40 mg by mouth 2 (two) times daily as needed for heartburn or indigestion.     fluticasone (FLONASE) 50 MCG/ACT nasal spray Place 2 sprays into both nostrils daily. 16 g 0   lansoprazole (PREVACID) 30 MG capsule TAKE 1 CAPSULE (30 MG TOTAL) BY MOUTH DAILY AT 12 NOON. 90 capsule 1   losartan-hydrochlorothiazide (HYZAAR) 100-12.5 MG tablet TAKE 1 TABLET DAILY 90 tablet 1   nystatin cream (MYCOSTATIN) APPLY TO AFFECTED AREA TWICE A DAY 30 g 0   ondansetron (ZOFRAN ODT) 4 MG disintegrating tablet Take 1 tablet (4 mg total) by mouth every 8 (eight) hours as needed for nausea or vomiting. 20 tablet 0   RETIN-A 0.01 % gel      rosuvastatin (CRESTOR) 5 MG tablet TAKE 1 TABLET DAILY 90 tablet 1   sildenafil (REVATIO) 20 MG tablet TAKE 3 TO 5 TABLETS BY MOUTH DAILY AS NEEDED 90 tablet 0   Spacer/Aero-Holding Chambers (AEROCHAMBER PLUS) inhaler Use with inhaler 1 each 2   Syringe/Needle, Disp, (SYRINGE 3CC/18GX1-1/2") 18G X 1-1/2" 3 ML MISC Use to draw up testosterone 10 each 0  Syringe/Needle, Disp, (SYRINGE 3CC/22GX1-1/2") 22G X 1-1/2" 3 ML MISC Use to administer testosterone  every 2 weeks 10 each 5   Syringe/Needle, Disp, (SYRINGE 3CC/25GX1") 25G X 1" 3 ML MISC Use for b12 injections 50 each 0   testosterone cypionate (DEPOTESTOSTERONE CYPIONATE) 200 MG/ML injection Inject 1 mL (200 mg total) into the muscle every 14 (fourteen) days. 10 mL 0   tiZANidine (ZANAFLEX) 4 MG tablet Take 1 tablet (4 mg total) by mouth every 8 (eight) hours as needed for muscle spasms. 60 tablet 5   traMADol (ULTRAM) 50 MG tablet Take 1 tablet (50 mg total) by mouth every 12 (twelve) hours as needed for moderate pain. 60 tablet 2   triamcinolone  ointment (KENALOG) 0.1 % Apply 1 application topically 2 (two) times daily.     Vitamin D, Ergocalciferol, (DRISDOL) 1.25 MG (50000 UNIT) CAPS capsule TAKE 1 CAPSULE ONCE WEEKLY 12 capsule 0   No current facility-administered medications on file prior to visit.    Review of Systems  Constitutional:  Negative for chills and fever.  HENT:  Positive for trouble swallowing. Negative for voice change.   Respiratory:  Negative for cough.   Cardiovascular:  Negative for chest pain and palpitations.  Gastrointestinal:  Negative for nausea and vomiting.      Objective:    BP 118/80   Pulse 81   Temp 98.5 F (36.9 C) (Oral)   Ht 5\' 9"  (1.753 m)   Wt 242 lb 12.8 oz (110.1 kg)   SpO2 97%   BMI 35.86 kg/m   BP Readings from Last 3 Encounters:  11/10/22 118/80  10/08/22 128/82  08/23/22 138/80   Wt Readings from Last 3 Encounters:  11/10/22 242 lb 12.8 oz (110.1 kg)  10/08/22 244 lb 12.8 oz (111 kg)  08/23/22 230 lb 3.2 oz (104.4 kg)    Physical Exam Vitals reviewed.  Constitutional:      Appearance: He is well-developed.  HENT:     Mouth/Throat:     Lips: No lesions.     Tongue: No lesions.     Palate: No mass and lesions.     Pharynx: No pharyngeal swelling, oropharyngeal exudate, posterior oropharyngeal erythema or uvula swelling.  Neck:     Thyroid: No thyroid mass, thyromegaly or thyroid tenderness.  Cardiovascular:     Rate and Rhythm: Regular rhythm.     Heart sounds: Normal heart sounds.  Pulmonary:     Effort: Pulmonary effort is normal. No respiratory distress.     Breath sounds: Normal breath sounds. No wheezing, rhonchi or rales.  Skin:    General: Skin is warm and dry.  Neurological:     Mental Status: He is alert.  Psychiatric:        Speech: Speech normal.        Behavior: Behavior normal.

## 2022-11-10 NOTE — Assessment & Plan Note (Addendum)
Scheduled EGD/ colonoscopy 01/2023 Dr Collene Mares EGD 12/03/2015 normal.  Question if dysphagia playing a role as he does have trouble swallowing pills from time to time.  No history of malignancy.  He is not using a medication that I know of which would cause hiccups.   advised to continue Prevacid 30 mg every morning.  As has been taking Pepcid 20 mg as needed, advised him to take Pepcid 20 mg nightly and advance to Pepcid 20 mg twice daily if needed.  If ineffective, consider moving up EGD/ colonoscopy. May consider baclofen.  He has follow-up with PCP 11/29/22.

## 2022-11-10 NOTE — Patient Instructions (Addendum)
Continue Prevacid 30 mg every morning 30 minutes to 1 hour prior to breakfast.  Please start Pepcid AC 20 mg at bedtime.  If this is ineffective for hiccups you may increase Pepcid to 20 mg twice daily prior to lunch and dinner.   If ineffective, may consider baclofen and also moving up endoscopy/colonoscopy.

## 2022-11-14 ENCOUNTER — Other Ambulatory Visit: Payer: Self-pay | Admitting: Internal Medicine

## 2022-11-18 ENCOUNTER — Other Ambulatory Visit: Payer: Self-pay | Admitting: Internal Medicine

## 2022-11-26 ENCOUNTER — Other Ambulatory Visit: Payer: Self-pay | Admitting: Internal Medicine

## 2022-11-29 ENCOUNTER — Ambulatory Visit: Payer: BC Managed Care – PPO | Admitting: Internal Medicine

## 2022-11-29 ENCOUNTER — Encounter: Payer: Self-pay | Admitting: Internal Medicine

## 2022-11-29 VITALS — BP 132/78 | HR 87 | Temp 98.1°F | Ht 69.0 in | Wt 235.4 lb

## 2022-11-29 DIAGNOSIS — E669 Obesity, unspecified: Secondary | ICD-10-CM

## 2022-11-29 DIAGNOSIS — I10 Essential (primary) hypertension: Secondary | ICD-10-CM | POA: Diagnosis not present

## 2022-11-29 DIAGNOSIS — R7989 Other specified abnormal findings of blood chemistry: Secondary | ICD-10-CM

## 2022-11-29 DIAGNOSIS — M130 Polyarthritis, unspecified: Secondary | ICD-10-CM

## 2022-11-29 DIAGNOSIS — R5383 Other fatigue: Secondary | ICD-10-CM

## 2022-11-29 DIAGNOSIS — R066 Hiccough: Secondary | ICD-10-CM

## 2022-11-29 DIAGNOSIS — M791 Myalgia, unspecified site: Secondary | ICD-10-CM | POA: Diagnosis not present

## 2022-11-29 DIAGNOSIS — E782 Mixed hyperlipidemia: Secondary | ICD-10-CM

## 2022-11-29 DIAGNOSIS — M2550 Pain in unspecified joint: Secondary | ICD-10-CM

## 2022-11-29 DIAGNOSIS — E538 Deficiency of other specified B group vitamins: Secondary | ICD-10-CM | POA: Diagnosis not present

## 2022-11-29 LAB — COMPREHENSIVE METABOLIC PANEL
ALT: 35 U/L (ref 0–53)
AST: 21 U/L (ref 0–37)
Albumin: 4.5 g/dL (ref 3.5–5.2)
Alkaline Phosphatase: 53 U/L (ref 39–117)
BUN: 11 mg/dL (ref 6–23)
CO2: 26 mEq/L (ref 19–32)
Calcium: 9.5 mg/dL (ref 8.4–10.5)
Chloride: 104 mEq/L (ref 96–112)
Creatinine, Ser: 0.9 mg/dL (ref 0.40–1.50)
GFR: 95.71 mL/min (ref >60.00)
Glucose, Bld: 93 mg/dL (ref 70–99)
Potassium: 4.1 mEq/L (ref 3.5–5.1)
Sodium: 139 mEq/L (ref 135–145)
Total Bilirubin: 0.7 mg/dL (ref 0.2–1.2)
Total Protein: 6.7 g/dL (ref 6.0–8.3)

## 2022-11-29 LAB — MICROALBUMIN / CREATININE URINE RATIO
Creatinine,U: 106.3 mg/dL
Microalb Creat Ratio: 0.7 mg/g (ref 0.0–30.0)
Microalb, Ur: 0.7 mg/dL (ref 0.0–1.9)

## 2022-11-29 LAB — LIPID PANEL
Cholesterol: 108 mg/dL (ref 0–200)
HDL: 39.6 mg/dL (ref >39.00)
LDL Cholesterol: 49 mg/dL (ref 0–99)
NonHDL: 68.83
Total CHOL/HDL Ratio: 3
Triglycerides: 99 mg/dL (ref 0.0–149.0)
VLDL: 19.8 mg/dL (ref 0.0–40.0)

## 2022-11-29 LAB — CK: Total CK: 114 U/L (ref 7–232)

## 2022-11-29 LAB — URIC ACID: Uric Acid, Serum: 5.5 mg/dL (ref 4.0–7.8)

## 2022-11-29 LAB — VITAMIN B12: Vitamin B-12: 216 pg/mL (ref 211–911)

## 2022-11-29 LAB — LDL CHOLESTEROL, DIRECT: Direct LDL: 64 mg/dL

## 2022-11-29 LAB — HEMOGLOBIN A1C: Hgb A1c MFr Bld: 5.5 % (ref 4.6–6.5)

## 2022-11-29 LAB — TSH: TSH: 1.01 u[IU]/mL (ref 0.35–5.50)

## 2022-11-29 LAB — C-REACTIVE PROTEIN: CRP: <1 mg/dL (ref 0.5–20.0)

## 2022-11-29 NOTE — Progress Notes (Unsigned)
Subjective:  Patient ID: Ronald Cordova, male    DOB: 06-19-1966  Age: 57 y.o. MRN: [id number]  CC: The primary encounter diagnosis was Essential hypertension. Diagnoses of Mixed hyperlipidemia, Other fatigue, Low testosterone in male, and B12 deficiency were also pertinent to this visit.   HPI Ronald Cordova presents for  Chief Complaint  Patient presents with   Medical Management of Chronic Issues    1)  Intermittent hiccups lasting up to 3 days for the past year,  month long intervals of quiescence   2) Obesity/GAD :  weight gain due to grief and overeating following his mother's death and his dog's death.   Reports that 3 weeks ago she felt emotionally healed.  He has lost 5 lbs on his own .    3) Joint pain:  both shoulders,  right knee. Has seen [facility] in [place].  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     clonazePAM (KLONOPIN) 1 MG tablet TAKE 1.5 TABLETS (1.5 MG TOTAL) BY MOUTH DAILY AS NEEDED FOR ANXIETY 45 tablet 5   diphenoxylate-atropine (LOMOTIL) 2.5-0.025 MG tablet Take 1 tablet by mouth 4 (four) times daily as needed for diarrhea or loose stools. 30 tablet 0   doxazosin (CARDURA) 2 MG tablet TAKE 1 TABLET DAILY 90 tablet 1   Eluxadoline (VIBERZI) 100 MG TABS Take 2 tablets by mouth daily.     ezetimibe (ZETIA) 10 MG tablet TAKE 1 TABLET DAILY 90 tablet 2   famotidine (PEPCID) 40 MG tablet Take 40 mg by mouth 2 (two) times daily as needed for heartburn or indigestion.     fluticasone (FLONASE) 50 MCG/ACT nasal spray Place 2 sprays into both nostrils daily. 16 g 0   lansoprazole (PREVACID) 30 MG capsule TAKE 1 CAPSULE (30 MG TOTAL) BY MOUTH DAILY AT 12 NOON. 90 capsule 1   losartan-hydrochlorothiazide (HYZAAR) 100-12.5 MG tablet TAKE 1 TABLET DAILY 90 tablet 2   nystatin cream  (MYCOSTATIN) APPLY TO AFFECTED AREA TWICE A DAY 30 g 0   ondansetron (ZOFRAN ODT) 4 MG disintegrating tablet Take 1 tablet (4 mg total) by mouth every 8 (eight) hours as needed for nausea or vomiting. 20 tablet 0   RETIN-A 0.01 % gel      rosuvastatin (CRESTOR) 5 MG tablet TAKE 1 TABLET DAILY 90 tablet 1   sildenafil (REVATIO) 20 MG tablet TAKE 3 TO 5 TABLETS BY MOUTH DAILY AS NEEDED 90 tablet 0   Spacer/Aero-Holding Chambers (AEROCHAMBER PLUS) inhaler Use with inhaler 1 each 2   Syringe/Needle, Disp, (SYRINGE 3CC/18GX1-1/2") 18G X 1-1/2" 3 ML MISC Use to draw up testosterone 10 each 0   Syringe/Needle, Disp, (SYRINGE 3CC/22GX1-1/2") 22G X 1-1/2" 3 ML MISC Use to administer testosterone  every 2 weeks 10 each 5   Syringe/Needle, Disp, (SYRINGE 3CC/25GX1") 25G X 1" 3 ML MISC Use for b12 injections 50 each 0   testosterone cypionate (DEPOTESTOSTERONE CYPIONATE) 200 MG/ML injection Inject 1 mL (200 mg total) into the muscle every 14 (fourteen) days. 10 mL 0   tiZANidine (ZANAFLEX) 4 MG tablet Take 1 tablet (4 mg total) by mouth every 8 (eight) hours as needed for muscle spasms. 60 tablet 5   traMADol (ULTRAM) 50 MG tablet Take 1 tablet (50 mg total) by mouth every  12 (twelve) hours as needed for moderate pain. 60 tablet 2   triamcinolone ointment (KENALOG) 0.1 % Apply 1 application topically 2 (two) times daily.     Vitamin D, Ergocalciferol, (DRISDOL) 1.25 MG (50000 UNIT) CAPS capsule TAKE 1 CAPSULE ONCE WEEKLY 12 capsule 0   No facility-administered medications prior to visit.    Review of Systems;  Patient denies headache, fevers, malaise, unintentional weight loss, skin rash, eye pain, sinus congestion and sinus pain, sore throat, dysphagia,  hemoptysis , cough, dyspnea, wheezing, chest pain, palpitations, orthopnea, edema, abdominal pain, nausea, melena, diarrhea, constipation, flank pain, dysuria, hematuria, urinary  Frequency, nocturia, numbness, tingling, seizures,  Focal weakness, Loss of  consciousness,  Tremor, insomnia, depression, anxiety, and suicidal ideation.      Objective:  BP 132/78   Pulse 87   Temp 98.1 F (36.7 C) (Oral)   Ht  (1.753 m)   Wt 235 lb 6.4 oz (106.8 kg)   SpO2 97%   BMI 34.76 kg/m   BP Readings from Last 3 Encounters:  11/29/22 132/78  11/10/22 118/80  10/08/22 128/82    Wt Readings from Last 3 Encounters:  11/29/22 235 lb 6.4 oz (106.8 kg)  11/10/22 242 lb 12.8 oz (110.1 kg)  10/08/22 244 lb 12.8 oz (111 kg)    Physical Exam  Lab Results  Component Value Date   HGBA1C 5.7 11/16/2021   HGBA1C 5.7 03/16/2021   HGBA1C 5.3 12/03/2019    Lab Results  Component Value Date   CREATININE 0.87 05/24/2022   CREATININE 0.76 03/26/2022   CREATININE 1.05 02/08/2022    Lab Results  Component Value Date   WBC 4.2 05/24/2022   HGB 14.1 05/24/2022   HCT 43.1 05/24/2022   PLT 238.0 05/24/2022   GLUCOSE 100 (H) 05/24/2022   CHOL 97 05/24/2022   TRIG 89.0 05/24/2022   HDL 33.90 (L) 05/24/2022   LDLDIRECT 83.0 02/08/2022   LDLCALC 45 05/24/2022   ALT 20 05/24/2022   AST 17 05/24/2022   NA 141 05/24/2022   K 4.3 05/24/2022   CL 106 05/24/2022   CREATININE 0.87 05/24/2022   BUN 14 05/24/2022   CO2 27 05/24/2022   TSH 0.88 07/21/2020   PSA 0.57 02/08/2022   HGBA1C 5.7 11/16/2021   MICROALBUR <0.7 12/03/2019    NM Hepato W/EF  Result Date: 04/22/2022 CLINICAL DATA:  Right upper quadrant abdominal pain x7 years. EXAM: NUCLEAR MEDICINE HEPATOBILIARY IMAGING WITH GALLBLADDER EF TECHNIQUE: Sequential images of the abdomen were obtained out to 60 minutes following intravenous administration of radiopharmaceutical. After oral ingestion of Ensure, gallbladder ejection fraction was determined. At 60 min, normal ejection fraction is greater than 33%. RADIOPHARMACEUTICALS:  5.1 mCi Tc-26m  Choletec IV COMPARISON:  Ultrasound April 22, 2022 FINDINGS: Prompt uptake and biliary excretion of activity by the liver is seen. Gallbladder  activity is visualized, consistent with patency of cystic duct. Biliary activity passes into small bowel, consistent with patent common bile duct. Calculated gallbladder ejection fraction is 62%. (Normal gallbladder ejection fraction with Ensure is greater than 33%.) IMPRESSION: 1.  Patent cystic and common bile ducts. 2.  Normal gallbladder ejection fraction. Electronically Signed   By: Maudry Mayhew M.D.   On: 04/22/2022 16:04   US Abdomen Limited RUQ (LIVER/GB)  Result Date: 04/22/2022 CLINICAL DATA:  Right upper quadrant pain EXAM: ULTRASOUND ABDOMEN LIMITED RIGHT UPPER QUADRANT COMPARISON:  None Available. FINDINGS: Gallbladder: No gallstones or wall thickening visualized. No sonographic Murphy sign noted by sonographer. Common bile  duct: Diameter: 3 mm Liver: Increased echogenicity. No focal lesion. Portal vein is patent on color Doppler imaging with normal direction of blood flow towards the liver. Other: None. IMPRESSION: Increased hepatic parenchymal echogenicity suggestive of steatosis. No cholelithiasis or sonographic evidence for acute cholecystitis. Electronically Signed   By: Annia Belt M.D.   On: 04/22/2022 09:44    Assessment & Plan:  .Essential hypertension -     Comprehensive metabolic panel -     Microalbumin / creatinine urine ratio  Mixed hyperlipidemia -     Lipid panel -     LDL cholesterol, direct -     Hemoglobin A1c  Other fatigue -     TSH  Low testosterone in male -     Testosterone,Free and Total  B12 deficiency -     Vitamin B12     I provided 30 minutes of face-to-face time during this encounter reviewing patient's last visit with me, patient's  most recent visit with cardiology,  nephrology,  and neurology,  recent surgical and non surgical procedures, previous  labs and imaging studies, counseling on currently addressed issues,  and post visit ordering to diagnostics and therapeutics .   Follow-up: No follow-ups on file.   Sherlene Shams, MD

## 2022-11-29 NOTE — Patient Instructions (Signed)
Additional labs today to rule out autoimmune disease   I agree with Margaret's plan to stay on the gastritis/GERD medications.  If the hiccups return,  please contact me and I will order a CT of the abd and pelvis

## 2022-11-29 NOTE — Assessment & Plan Note (Signed)
Scheduled EGD/ colonoscopy 01/2023 Dr Loreta Ave. EGD 12/03/2015 normal. Started on Viberzi by GI DR Loreta Ave  for IBS>  Question if dysphagia playing a role as he does have trouble swallowing pills from time to time.  No history of malignancy.  He is not using a medication that I know of which would cause hiccups. . He was advised by Claris Che  to continue Prevacid 30 mg every morning, add Pepcid 20 mg as needed, advised him to take Pepcid 20 mg nightly and advance to Pepcid 20 mg twice daily if needed.  If ineffective, consider moving up EGD/ colonoscopy. May consider baclofen.  He has follow-up with PCP 11/29/22.

## 2022-11-30 NOTE — Assessment & Plan Note (Signed)
Well controlled on current regimen. Renal function stable, no changes today.   Lab Results  Component Value Date   CREATININE 0.90 11/29/2022   Lab Results  Component Value Date   NA 139 11/29/2022   K 4.1 11/29/2022   CL 104 11/29/2022   CO2 26 11/29/2022

## 2022-11-30 NOTE — Assessment & Plan Note (Signed)
I have addressed  BMI and recommended the [place]avia diet as it has a more favorable effect on lipids than theKETO diet  And utilizes smaller more frequent meals to increase metabolism.  I have also recommended that patient start exercising with a goal of 30 minutes of aerobic exercise a minimum of 5 days per week. Screening for lipid disorders, thyroid and diabetes have been done  done today. ? ? ?

## 2022-11-30 NOTE — Assessment & Plan Note (Addendum)
He has not taken medication in several months. Advised to resume medication  Lab Results  Component Value Date   TESTOSTERONE 176 (L) 11/29/2022

## 2022-11-30 NOTE — Assessment & Plan Note (Signed)
Screening for autoimmune disease

## 2022-12-01 LAB — ANA: Anti Nuclear Antibody (ANA): NEGATIVE

## 2022-12-01 LAB — RHEUMATOID FACTOR: Rheumatoid fact SerPl-aCnc: <10 IU/mL (ref <14)

## 2022-12-01 LAB — CYCLIC CITRUL PEPTIDE ANTIBODY, IGG: Cyclic Citrullin Peptide Ab: <16 UNITS

## 2022-12-02 ENCOUNTER — Other Ambulatory Visit: Payer: Self-pay | Admitting: Internal Medicine

## 2022-12-02 LAB — INTRINSIC FACTOR ANTIBODIES: Intrinsic Factor: NEGATIVE

## 2022-12-02 LAB — TESTOSTERONE,FREE AND TOTAL
Testosterone, Free: 2.4 pg/mL — ABNORMAL LOW (ref 7.2–24.0)
Testosterone: 176 ng/dL — ABNORMAL LOW (ref 264–916)

## 2022-12-02 NOTE — Telephone Encounter (Signed)
New message    Patient is ready for medication to be called in    1. Which medications need to be refilled? (please list name of each medication and dose if known) testosterone cypionate (DEPOTESTOSTERONE CYPIONATE) 200 MG/ML injection & B12 injection    2. Which pharmacy/location (including street and city if local pharmacy) is medication to be sent to? [facility] [address] Nich[address]   3. Do they need a 30 day or 90 day supply? 30 day supply

## 2022-12-03 ENCOUNTER — Other Ambulatory Visit: Payer: Self-pay | Admitting: Internal Medicine

## 2022-12-03 MED ORDER — TESTOSTERONE CYPIONATE 200 MG/ML IM SOLN: 200.0000 mg | INTRAMUSCULAR | 0 refills | Status: DC

## 2022-12-05 NOTE — Telephone Encounter (Signed)
Lov: 11/29/2022   Nov: 06/06/2023

## 2022-12-17 ENCOUNTER — Other Ambulatory Visit: Payer: Self-pay | Admitting: Internal Medicine

## 2023-01-24 ENCOUNTER — Encounter: Payer: Self-pay | Admitting: Internal Medicine

## 2023-01-25 ENCOUNTER — Telehealth: Payer: Self-pay | Admitting: Internal Medicine

## 2023-01-25 NOTE — Telephone Encounter (Signed)
Pt called in stating that as per last visit, his b-12 was very low, and Dr. Darrick Huntsman wanted to start maybe getting the shot? So he's calling to check up on that? He would like a call to f/u.

## 2023-01-25 NOTE — Telephone Encounter (Signed)
Spoke with pt and he stated that since he is already giving himself testosterone he would like to give himself the b12 injections. Does pt need to start with weekly injections and then go to monthly?

## 2023-01-27 ENCOUNTER — Other Ambulatory Visit: Payer: Self-pay | Admitting: Internal Medicine

## 2023-01-27 MED ORDER — CYANOCOBALAMIN 1000 MCG/ML IJ SOLN: INTRAMUSCULAR | 0 refills | Status: DC

## 2023-01-27 MED ORDER — "LUER LOCK SAFETY SYRINGES 25G X 1"" 3 ML MISC": 0 refills | Status: DC

## 2023-01-27 MED ORDER — "NEEDLE (DISP) 25G X 1"" MISC": 0 refills | Status: DC

## 2023-01-27 NOTE — Telephone Encounter (Signed)
Refilled: 09/27/2022 Last OV: 11/29/2022 Next OV: 06/06/2023

## 2023-01-27 NOTE — Addendum Note (Signed)
Addended by: Sandy Salaam on: 01/27/2023 10:53 AM   Modules accepted: Orders

## 2023-01-27 NOTE — Telephone Encounter (Signed)
B12 has been sent to pharmacy.

## 2023-02-16 ENCOUNTER — Other Ambulatory Visit: Payer: Self-pay | Admitting: Internal Medicine

## 2023-02-27 ENCOUNTER — Other Ambulatory Visit: Payer: Self-pay | Admitting: Internal Medicine

## 2023-02-28 NOTE — Telephone Encounter (Signed)
Refilled: 12/03/2022 Last OV: 11/29/2022 Next OV: 06/06/2023

## 2023-03-28 ENCOUNTER — Telehealth: Payer: Self-pay | Admitting: Internal Medicine

## 2023-03-28 ENCOUNTER — Ambulatory Visit: Payer: BC Managed Care – PPO | Admitting: Family Medicine

## 2023-03-28 ENCOUNTER — Ambulatory Visit
Admission: RE | Admit: 2023-03-28 | Discharge: 2023-03-28 | Disposition: A | Discharge status: Home / Self Care | Payer: BC Managed Care – PPO | Source: Ambulatory Visit | Attending: Family Medicine | Admitting: Family Medicine

## 2023-03-28 ENCOUNTER — Encounter: Payer: Self-pay | Admitting: Family Medicine

## 2023-03-28 VITALS — BP 122/78 | HR 96 | Temp 97.9°F | Ht 69.0 in | Wt 237.0 lb

## 2023-03-28 DIAGNOSIS — M7989 Other specified soft tissue disorders: Secondary | ICD-10-CM | POA: Insufficient documentation

## 2023-03-28 DIAGNOSIS — M79662 Pain in left lower leg: Secondary | ICD-10-CM

## 2023-03-28 DIAGNOSIS — T753XXA Motion sickness, initial encounter: Secondary | ICD-10-CM

## 2023-03-28 MED ORDER — SCOPOLAMINE 1 MG/3DAYS TD PT72: 1.0000 | MEDICATED_PATCH | TRANSDERMAL | 0 refills | Status: AC

## 2023-03-28 NOTE — Telephone Encounter (Signed)
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 Pt stated the cma and provider have did the form before

## 2023-03-28 NOTE — Assessment & Plan Note (Signed)
New onset issue.  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  Will get an ultrasound to evaluate for DVT.  Discussed if is not a DVT it is possible that it could be Achilles tendinitis or some other soft tissue issue at his heel.  Discussed icing 10 to 15 minutes 3 times a day.  If not improving by the end of this week advised I would refer to sports medicine.

## 2023-03-28 NOTE — Progress Notes (Signed)
Marikay Alar, MD Phone: [phone]  Ronald Cordova is a 57 y.o. male who presents today for same day visit.   Left leg pain: Patient notes pain in his left heel radiating up into his left calf.  He notes possibly some swelling in his left lower leg.  No injury.  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  Motion sickness: Patient notes this occurs typically while riding in the back of an 18 wheeler.  He notes he has been doing long distance runs and working in a team for this.  He gets nausea.  He has been trying Zofran, nausea bands, and Pepcid with no benefit.  He wonders about using scopolamine patches as he has used those previously on cruises.  Social History   Tobacco Use  Smoking Status Former   Current packs/day: 0.00   Average packs/day: 1 pack/day for 15.0 years (15.0 ttl pk-yrs)   Types: Cigarettes   Start date: 05/16/1996   Quit date: 05/17/2011   Years since quitting: 11.8  Smokeless Tobacco Never    Current Outpatient Medications on File Prior to Visit  Medication Sig Dispense Refill   celecoxib (CELEBREX) 200 MG capsule Take 200 mg by mouth daily.     clonazePAM (KLONOPIN) 1 MG tablet TAKE 1.5 TABLETS (1.5 MG TOTAL) BY MOUTH DAILY AS NEEDED FOR ANXIETY 45 tablet 5   cyanocobalamin (VITAMIN B12) 1000 MCG/ML injection INJECT 1 ML INTRAMUSCULARLY ONCE A WEEK FOR 4 WEEKS AND THEN ONCE MONTHLY. 12 mL 1   diphenoxylate-atropine (LOMOTIL) 2.5-0.025 MG tablet Take 1 tablet by mouth 4 (four) times daily as needed for diarrhea or loose stools. 30 tablet 0   doxazosin (CARDURA) 2 MG tablet TAKE 1 TABLET DAILY 90 tablet 1   Eluxadoline (VIBERZI) 100 MG TABS Take 2 tablets by mouth daily.     ezetimibe (ZETIA) 10 MG tablet TAKE 1 TABLET DAILY 90 tablet 2   famotidine (PEPCID) 40 MG tablet Take 40 mg by mouth 2 (two) times daily as needed for heartburn or indigestion.     fluticasone (FLONASE) 50 MCG/ACT nasal spray Place 2 sprays into both nostrils daily. 16 g 0    lansoprazole (PREVACID) 30 MG capsule TAKE 1 CAPSULE (30 MG TOTAL) BY MOUTH DAILY AT 12 NOON. 90 capsule 1   losartan-hydrochlorothiazide (HYZAAR) 100-12.5 MG tablet TAKE 1 TABLET DAILY 90 tablet 2   NEEDLE, DISP, 25 G 25G X 1" MISC Use to draw up 1 ml of Vitamin b12 16 each 0   nystatin cream (MYCOSTATIN) APPLY TO AFFECTED AREA TWICE A DAY 30 g 0   ondansetron (ZOFRAN ODT) 4 MG disintegrating tablet Take 1 tablet (4 mg total) by mouth every 8 (eight) hours as needed for nausea or vomiting. 20 tablet 0   RETIN-A 0.01 % gel      rosuvastatin (CRESTOR) 5 MG tablet TAKE 1 TABLET DAILY 90 tablet 1   sildenafil (REVATIO) 20 MG tablet TAKE 3 TO 5 TABLETS BY MOUTH DAILY AS NEEDED 90 tablet 0   Spacer/Aero-Holding Chambers (AEROCHAMBER PLUS) inhaler Use with inhaler 1 each 2   SYRINGE-NEEDLE, DISP, 3 ML (LUER LOCK SAFETY SYRINGES) 25G X 1" 3 ML MISC Use to give b12 injection. 16 each 0   Syringe/Needle, Disp, (SYRINGE 3CC/18GX1-1/2") 18G X 1-1/2" 3 ML MISC Use to draw up testosterone 10 each 0   Syringe/Needle, Disp, (SYRINGE 3CC/22GX1-1/2") 22G X 1-1/2" 3 ML MISC Use to administer testosterone  every 2 weeks 10 each 5   Syringe/Needle,  Disp, (SYRINGE 3CC/25GX1") 25G X 1" 3 ML MISC Use for b12 injections 50 each 0   testosterone cypionate (DEPOTESTOSTERONE CYPIONATE) 200 MG/ML injection INJECT 1 ML (200 MG TOTAL) INTO THE MUSCLE EVERY 14 DAYS 6 mL 1   tiZANidine (ZANAFLEX) 4 MG tablet Take 1 tablet (4 mg total) by mouth every 8 (eight) hours as needed for muscle spasms. 60 tablet 5   traMADol (ULTRAM) 50 MG tablet TAKE 1 TABLET (50 MG TOTAL) BY MOUTH EVERY 12 (TWELVE) HOURS AS NEEDED FOR MODERATE PAIN. 60 tablet 2   triamcinolone ointment (KENALOG) 0.1 % Apply 1 application topically 2 (two) times daily.     Vitamin D, Ergocalciferol, (DRISDOL) 1.25 MG (50000 UNIT) CAPS capsule TAKE 1 CAPSULE ONCE WEEKLY 12 capsule 0   No current facility-administered medications on file prior to visit.     ROS see  history of present illness  Objective  Physical Exam Vitals:   03/28/23 1309  BP: 122/78  Pulse: 96  Temp: 97.9 F (36.6 C)  SpO2: 97%    BP Readings from Last 3 Encounters:  03/28/23 122/78  11/29/22 132/78  11/10/22 118/80   Wt Readings from Last 3 Encounters:  03/28/23 237 lb (107.5 kg)  11/29/22 235 lb 6.4 oz (106.8 kg)  11/10/22 242 lb 12.8 oz (110.1 kg)    Physical Exam Constitutional:      General: He is not in acute distress.    Appearance: He is not diaphoretic.  Pulmonary:     Effort: Pulmonary effort is normal.  Musculoskeletal:       Legs:  Skin:    General: Skin is warm and dry.  Neurological:     Mental Status: He is alert.      Assessment/Plan: Please see individual problem list.  Pain and swelling of left lower leg Assessment & Plan: New onset issue.  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  Will get an ultrasound to evaluate for DVT.  Discussed if is not a DVT it is possible that it could be Achilles tendinitis or some other soft tissue issue at his heel.  Discussed icing 10 to 15 minutes 3 times a day.  If not improving by the end of this week advised I would refer to sports medicine.  Orders: -     US Venous Img Lower Unilateral Left (DVT); Future  Motion sickness, initial encounter Assessment & Plan: Patient has a history of this.  Discussed use of scopolamine patches.  He will monitor for drowsiness.  He will monitor for drowsiness particularly if he has to use these while taking his benzodiazepine.  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   Other orders -     Scopolamine; Place 1 patch (1.5 mg total) onto the skin every 3 (three) days.  Dispense: 10 patch; Refill: 0     Return if symptoms worsen or fail to improve.   Marikay Alar, MD [facility]

## 2023-03-28 NOTE — Assessment & Plan Note (Signed)
Patient has a history of this.  Discussed use of scopolamine patches.  He will monitor for drowsiness.  He will monitor for drowsiness particularly if he has to use these while taking his benzodiazepine.  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

## 2023-03-29 NOTE — Telephone Encounter (Signed)
If okay I will type up the letter for you to sign?

## 2023-03-29 NOTE — Telephone Encounter (Signed)
Letter updated and signed ,  in orange folder.

## 2023-03-30 NOTE — Telephone Encounter (Signed)
Pt is aware. Letter has been placed up front in accordion folder.

## 2023-04-23 ENCOUNTER — Other Ambulatory Visit: Payer: Self-pay | Admitting: Internal Medicine

## 2023-04-28 ENCOUNTER — Other Ambulatory Visit: Payer: Self-pay | Admitting: Internal Medicine

## 2023-05-06 IMAGING — DX DG CHEST 2V
2 series · 2 of 2 positions shown · non-contrast
Comparison: X-ray chest 05/07/2021.

CLINICAL DATA: Pneumonia. Follow up on left upper lobe chest
infiltrate seen on [DATE].

EXAM:
CHEST - 2 VIEW

[chest pa]
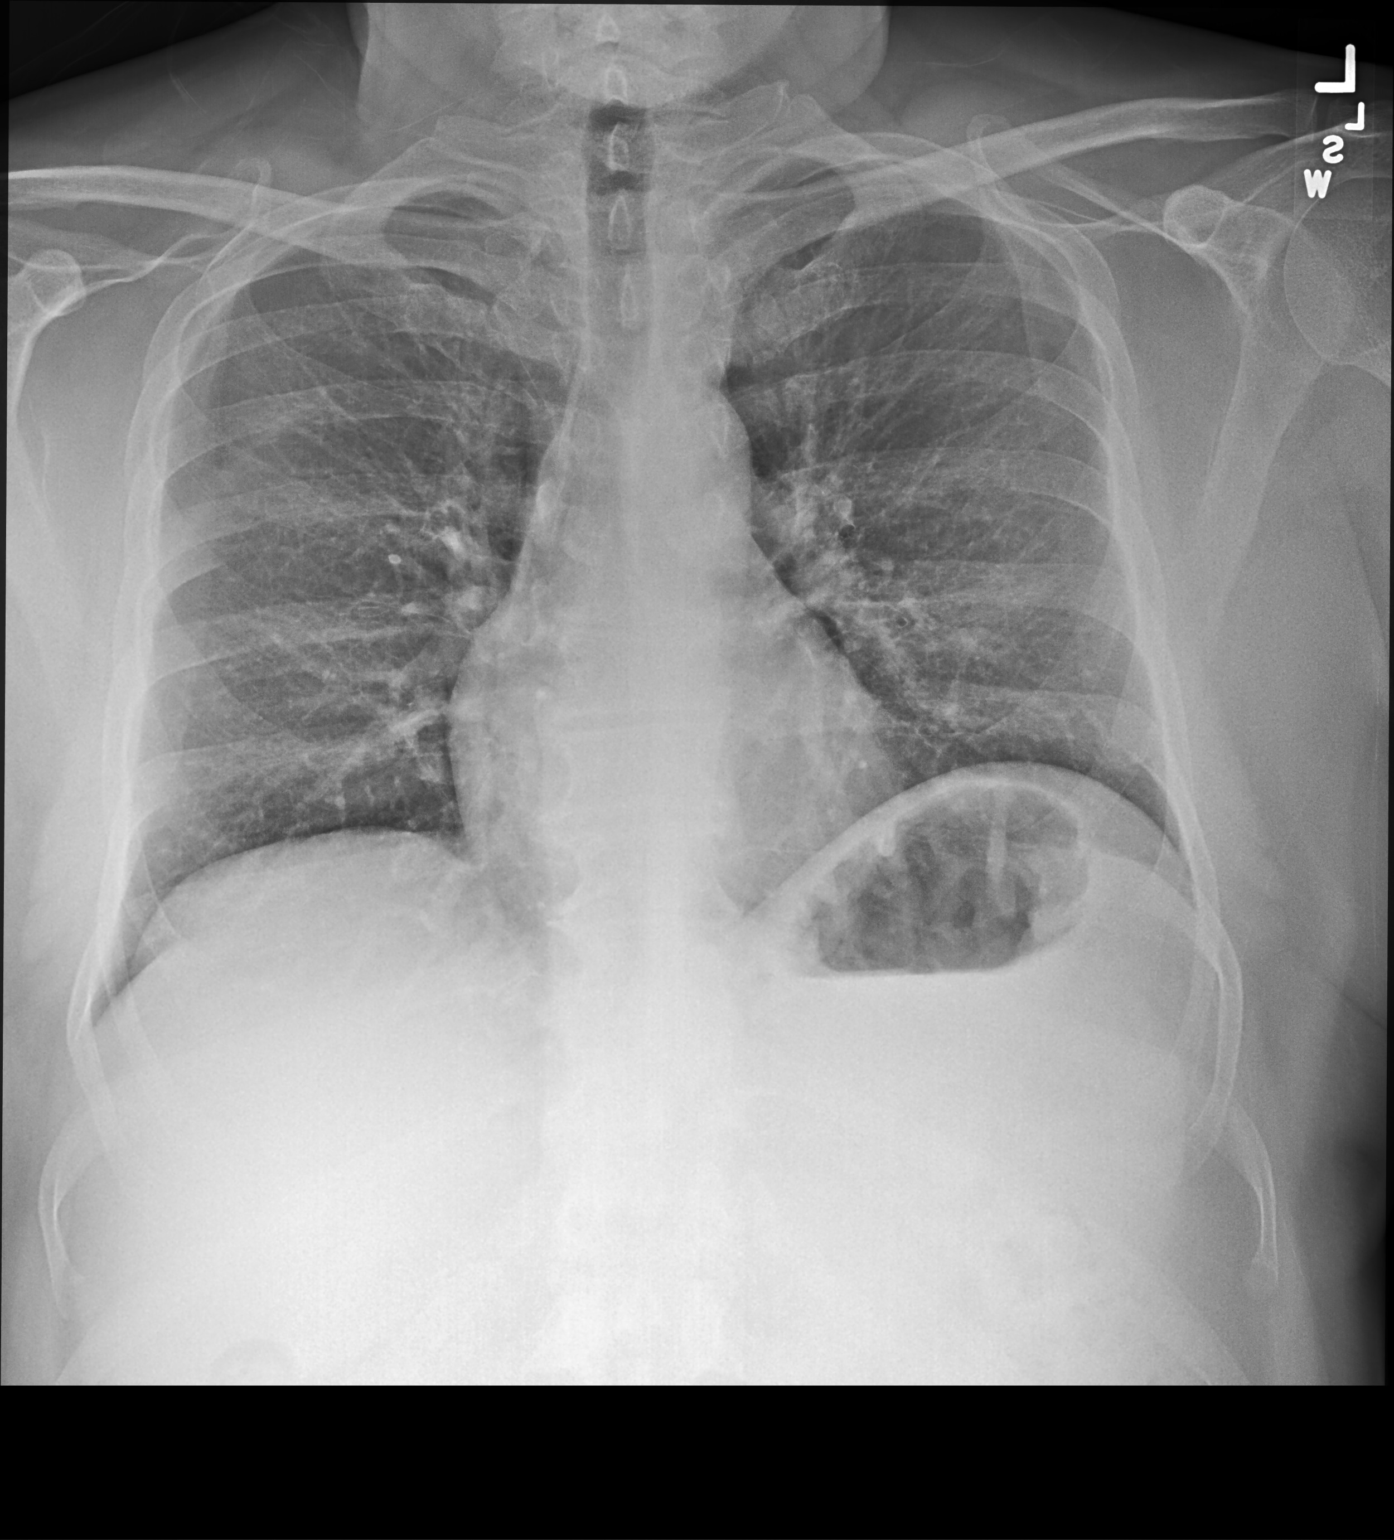

[chest lat]
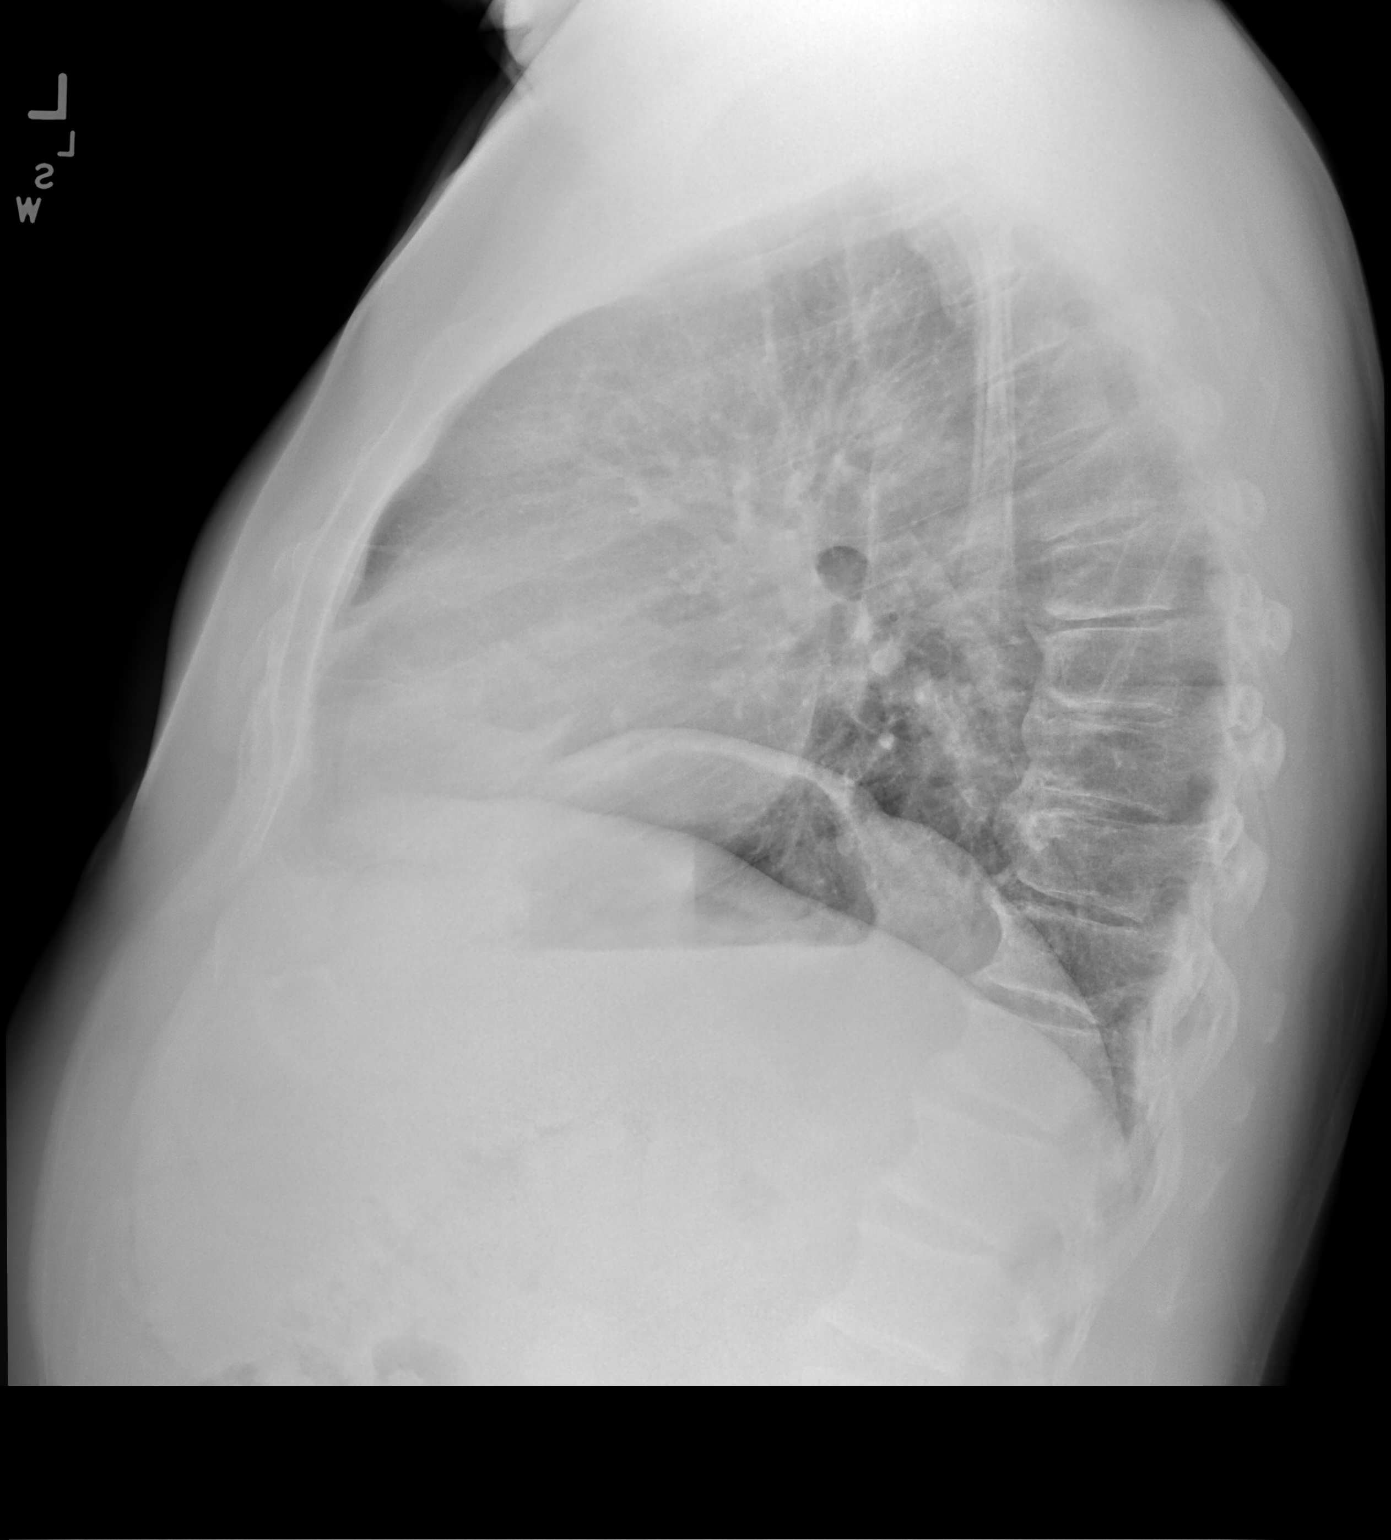

[2 of 2 positions shown; findings below may reference images not displayed]

FINDINGS: The cardiomediastinal silhouette is unchanged with normal heart
size. Left upper lobe airspace opacity on the prior study has nearly
completely resolved, with only a small amount of faint opacity
remaining in the mid lung on the PA radiograph. The right lung
remains clear. No pleural effusion or pneumothorax is identified. No
acute osseous abnormality is seen.
IMPRESSION: Near complete resolution of left upper lobe pneumonia.

## 2023-05-23 ENCOUNTER — Other Ambulatory Visit: Payer: Self-pay | Admitting: Internal Medicine

## 2023-06-06 ENCOUNTER — Other Ambulatory Visit
Admission: RE | Admit: 2023-06-06 | Discharge: 2023-06-06 | Disposition: A | Discharge status: Home / Self Care | Payer: BC Managed Care – PPO | Source: Ambulatory Visit | Attending: Internal Medicine | Admitting: Internal Medicine

## 2023-06-06 ENCOUNTER — Encounter: Payer: Self-pay | Admitting: Internal Medicine

## 2023-06-06 ENCOUNTER — Ambulatory Visit: Payer: BC Managed Care – PPO | Admitting: Internal Medicine

## 2023-06-06 VITALS — BP 124/82 | HR 78 | Ht 69.0 in | Wt 237.4 lb

## 2023-06-06 DIAGNOSIS — A09 Infectious gastroenteritis and colitis, unspecified: Secondary | ICD-10-CM

## 2023-06-06 DIAGNOSIS — R197 Diarrhea, unspecified: Secondary | ICD-10-CM | POA: Diagnosis present

## 2023-06-06 DIAGNOSIS — I10 Essential (primary) hypertension: Secondary | ICD-10-CM

## 2023-06-06 DIAGNOSIS — Z125 Encounter for screening for malignant neoplasm of prostate: Secondary | ICD-10-CM

## 2023-06-06 DIAGNOSIS — E782 Mixed hyperlipidemia: Secondary | ICD-10-CM

## 2023-06-06 DIAGNOSIS — R7301 Impaired fasting glucose: Secondary | ICD-10-CM | POA: Diagnosis not present

## 2023-06-06 DIAGNOSIS — Z23 Encounter for immunization: Secondary | ICD-10-CM

## 2023-06-06 DIAGNOSIS — I700 Atherosclerosis of aorta: Secondary | ICD-10-CM

## 2023-06-06 DIAGNOSIS — F331 Major depressive disorder, recurrent, moderate: Secondary | ICD-10-CM | POA: Insufficient documentation

## 2023-06-06 DIAGNOSIS — M064 Inflammatory polyarthropathy: Secondary | ICD-10-CM

## 2023-06-06 DIAGNOSIS — K200 Eosinophilic esophagitis: Secondary | ICD-10-CM

## 2023-06-06 LAB — LIPID PANEL
Cholesterol: 106 mg/dL (ref 0–200)
HDL: 37.7 mg/dL — ABNORMAL LOW (ref >39.00)
LDL Cholesterol: 54 mg/dL (ref 0–99)
NonHDL: 67.86
Total CHOL/HDL Ratio: 3
Triglycerides: 70 mg/dL (ref 0.0–149.0)
VLDL: 14 mg/dL (ref 0.0–40.0)

## 2023-06-06 LAB — MICROALBUMIN / CREATININE URINE RATIO
Creatinine,U: 392.8 mg/dL
Microalb Creat Ratio: 1.5 mg/g (ref 0.0–30.0)
Microalb, Ur: 5.9 mg/dL — ABNORMAL HIGH (ref 0.0–1.9)

## 2023-06-06 LAB — PSA: PSA: 0.54 ng/mL (ref 0.10–4.00)

## 2023-06-06 LAB — COMPREHENSIVE METABOLIC PANEL
ALT: 33 U/L (ref 0–53)
AST: 18 U/L (ref 0–37)
Albumin: 4.4 g/dL (ref 3.5–5.2)
Alkaline Phosphatase: 61 U/L (ref 39–117)
BUN: 11 mg/dL (ref 6–23)
CO2: 28 meq/L (ref 19–32)
Calcium: 9.6 mg/dL (ref 8.4–10.5)
Chloride: 106 meq/L (ref 96–112)
Creatinine, Ser: 0.97 mg/dL (ref 0.40–1.50)
GFR: 87.17 mL/min (ref >60.00)
Glucose, Bld: 98 mg/dL (ref 70–99)
Potassium: 4 meq/L (ref 3.5–5.1)
Sodium: 143 meq/L (ref 135–145)
Total Bilirubin: 0.6 mg/dL (ref 0.2–1.2)
Total Protein: 6.8 g/dL (ref 6.0–8.3)

## 2023-06-06 LAB — HEMOGLOBIN A1C: Hgb A1c MFr Bld: 5.6 % (ref 4.6–6.5)

## 2023-06-06 LAB — LDL CHOLESTEROL, DIRECT: Direct LDL: 59 mg/dL

## 2023-06-06 LAB — C DIFFICILE QUICK SCREEN W PCR REFLEX
C Diff antigen: NEGATIVE
C Diff interpretation: NOT DETECTED
C Diff toxin: NEGATIVE

## 2023-06-06 MED ORDER — LANSOPRAZOLE 30 MG PO CPDR: 30.0000 mg | DELAYED_RELEASE_CAPSULE | Freq: Every day | ORAL | 1 refills | Status: DC

## 2023-06-06 MED ORDER — NYSTATIN 100000 UNIT/ML MT SUSP: 5.0000 mL | Freq: Four times a day (QID) | OROMUCOSAL | 0 refills | Status: AC

## 2023-06-06 NOTE — Assessment & Plan Note (Signed)
History of campylobacter in January,  with return of symptoms.  Stool studies pending

## 2023-06-06 NOTE — Patient Instructions (Addendum)
Add allegra or  zyrtec  once  daily in the evening for the nighttime cough /drainage that may be due to Eosinophilic esophagitis or allergic rhinitis  Use the nystatin  once daily to prevent  Thrush (DON'T DO IT RIGHT AFTER YOUR ADVAIR; WAIT 6 HOURS OR MORE)  Call; [facility] in [place]  to get  your compounded semaglutide (wegovy)  The dose for the first 4 weekly doses is 0.25 MG  .  You may have mild nausea on the first or second day but this should resolve.  If not  ,  stop the medication.   As long as you are losing weight,  you can continue the dose you are on .   FOR YOUR REFILLS,  SEND ME A MYCHART MESSAGE WITH THE FOLLOWING INFORMATION:  1)  the dose you are currently taking  2) whether or not you have lost weight in the preceding week (has your weight plateaued?)  I recommend that you  increase the dose every  4 weeks ONLY if your weight has plateaued.

## 2023-06-06 NOTE — Progress Notes (Signed)
Subjective:  Patient ID: Ronald Cordova, male    DOB: 04/20/1966  Age: 57 y.o. MRN: [id number]  CC: The primary encounter diagnosis was Essential hypertension. Diagnoses of Mixed hyperlipidemia, Prostate cancer screening, Diarrhea of presumed infectious origin, Moderate episode of recurrent major depressive disorder (HCC), Inflammatory polyarthropathy (HCC), Aortic atherosclerosis (HCC), Impaired fasting glucose, Need for influenza vaccination, and Eosinophilic esophagitis were also pertinent to this visit.   HPI SAAHIR GIDCUMB presents for  Chief Complaint  Patient presents with   Medical Management of Chronic Issues    6 month follow up    Follow up GI issues  2 ) Eosinophilic esophagitis and IBS diagnosed by Dr Loreta Ave during  recent EGD in June .  Prescribed Advair for swallowing  and rifaxamin , which was stopped when his diarrhea studies were positive for campylobacter (per patient)  3) history of colitis in January (bacterial studies were positive) for Campylobacter  after eating at [employer] in [place].    [place] was changed to unnamed antibiotic and symptoms resolved and he returned to his baseline of alternating between solid and loose,   He does not recall what antibiotic was used  but  Symptoms have returned over the last 5 weeks .    4) obesity:  considering the GLP 1 agonist therapy for weight loss     Outpatient Medications Prior to Visit  Medication Sig Dispense Refill   celecoxib (CELEBREX) 200 MG capsule Take 200 mg by mouth daily.     clonazePAM (KLONOPIN) 1 MG tablet TAKE 1.5 TABLETS (1.5 MG TOTAL) BY MOUTH DAILY AS NEEDED FOR ANXIETY 45 tablet 1   cyanocobalamin (VITAMIN B12) 1000 MCG/ML injection INJECT 1 ML INTRAMUSCULARLY ONCE A WEEK FOR 4 WEEKS AND THEN ONCE MONTHLY. 12 mL 1   diphenoxylate-atropine (LOMOTIL) 2.5-0.025 MG tablet Take 1 tablet by mouth 4 (four) times daily as needed for diarrhea or loose stools. 30 tablet 0   doxazosin (CARDURA) 2 MG tablet TAKE  1 TABLET DAILY 90 tablet 1   Eluxadoline (VIBERZI) 100 MG TABS Take 2 tablets by mouth daily.     ezetimibe (ZETIA) 10 MG tablet TAKE 1 TABLET DAILY 90 tablet 2   famotidine (PEPCID) 40 MG tablet Take 40 mg by mouth 2 (two) times daily as needed for heartburn or indigestion.     fluticasone (FLONASE) 50 MCG/ACT nasal spray Place 2 sprays into both nostrils daily. 16 g 0   losartan-hydrochlorothiazide (HYZAAR) 100-12.5 MG tablet TAKE 1 TABLET DAILY 90 tablet 2   NEEDLE, DISP, 25 G 25G X 1" MISC Use to draw up 1 ml of Vitamin b12 16 each 0   nystatin cream (MYCOSTATIN) APPLY TO AFFECTED AREA TWICE A DAY 30 g 0   ondansetron (ZOFRAN ODT) 4 MG disintegrating tablet Take 1 tablet (4 mg total) by mouth every 8 (eight) hours as needed for nausea or vomiting. 20 tablet 0   RETIN-A 0.01 % gel      rosuvastatin (CRESTOR) 5 MG tablet TAKE 1 TABLET DAILY 90 tablet 1   scopolamine (TRANSDERM-SCOP) 1 MG/3DAYS Place 1 patch (1.5 mg total) onto the skin every 3 (three) days. 10 patch 0   sildenafil (REVATIO) 20 MG tablet TAKE 3 TO 5 TABLETS BY MOUTH DAILY AS NEEDED 90 tablet 0   Spacer/Aero-Holding Chambers (AEROCHAMBER PLUS) inhaler Use with inhaler 1 each 2   SYRINGE-NEEDLE, DISP, 3 ML (LUER LOCK SAFETY SYRINGES) 25G X 1" 3 ML MISC Use to give b12 injection. 16 each  0   Syringe/Needle, Disp, (SYRINGE 3CC/18GX1-1/2") 18G X 1-1/2" 3 ML MISC Use to draw up testosterone 10 each 0   Syringe/Needle, Disp, (SYRINGE 3CC/22GX1-1/2") 22G X 1-1/2" 3 ML MISC Use to administer testosterone  every 2 weeks 10 each 5   Syringe/Needle, Disp, (SYRINGE 3CC/25GX1") 25G X 1" 3 ML MISC Use for b12 injections 50 each 0   testosterone cypionate (DEPOTESTOSTERONE CYPIONATE) 200 MG/ML injection INJECT 1 ML (200 MG TOTAL) INTO THE MUSCLE EVERY 14 DAYS 6 mL 1   tiZANidine (ZANAFLEX) 4 MG tablet Take 1 tablet (4 mg total) by mouth every 8 (eight) hours as needed for muscle spasms. 60 tablet 5   traMADol (ULTRAM) 50 MG tablet TAKE 1 TABLET  (50 MG TOTAL) BY MOUTH EVERY 12 (TWELVE) HOURS AS NEEDED FOR MODERATE PAIN. 60 tablet 1   triamcinolone ointment (KENALOG) 0.1 % Apply 1 application topically 2 (two) times daily.     Vitamin D, Ergocalciferol, (DRISDOL) 1.25 MG (50000 UNIT) CAPS capsule TAKE 1 CAPSULE ONCE WEEKLY 12 capsule 0   lansoprazole (PREVACID) 30 MG capsule TAKE 1 CAPSULE (30 MG TOTAL) BY MOUTH DAILY AT 12 NOON. 90 capsule 1   No facility-administered medications prior to visit.    Review of Systems;  Patient denies headache, fevers, malaise, unintentional weight loss, skin rash, eye pain, sinus congestion and sinus pain, sore throat, dysphagia,  hemoptysis , cough, dyspnea, wheezing, chest pain, palpitations, orthopnea, edema, abdominal pain, nausea, melena, diarrhea, constipation, flank pain, dysuria, hematuria, urinary  Frequency, nocturia, numbness, tingling, seizures,  Focal weakness, Loss of consciousness,  Tremor, insomnia, depression, anxiety, and suicidal ideation.      Objective:  BP 124/82   Pulse 78   Ht 5\' 9"  (1.753 m)   Wt 237 lb 6.4 oz (107.7 kg)   SpO2 97%   BMI 35.06 kg/m   BP Readings from Last 3 Encounters:  06/06/23 124/82  03/28/23 122/78  11/29/22 132/78    Wt Readings from Last 3 Encounters:  06/06/23 237 lb 6.4 oz (107.7 kg)  03/28/23 237 lb (107.5 kg)  11/29/22 235 lb 6.4 oz (106.8 kg)    Physical Exam Vitals reviewed.  Constitutional:      General: He is not in acute distress.    Appearance: Normal appearance. He is normal weight. He is not ill-appearing, toxic-appearing or diaphoretic.  HENT:     Head: Normocephalic.  Eyes:     General: No scleral icterus.       Right eye: No discharge.        Left eye: No discharge.     Conjunctiva/sclera: Conjunctivae normal.  Cardiovascular:     Rate and Rhythm: Normal rate and regular rhythm.     Heart sounds: Normal heart sounds.  Pulmonary:     Effort: Pulmonary effort is normal. No respiratory distress.     Breath sounds:  Normal breath sounds.  Musculoskeletal:        General: Normal range of motion.     Cervical back: Normal range of motion.  Skin:    General: Skin is warm and dry.  Neurological:     General: No focal deficit present.     Mental Status: He is alert and oriented to person, place, and time. Mental status is at baseline.  Psychiatric:        Mood and Affect: Mood normal.        Behavior: Behavior normal.        Thought Content: Thought content normal.  Judgment: Judgment normal.   Lab Results  Component Value Date   HGBA1C 5.6 06/06/2023   HGBA1C 5.5 11/29/2022   HGBA1C 5.7 11/16/2021    Lab Results  Component Value Date   CREATININE 0.97 06/06/2023   CREATININE 0.90 11/29/2022   CREATININE 0.87 05/24/2022    Lab Results  Component Value Date   WBC 4.2 05/24/2022   HGB 14.1 05/24/2022   HCT 43.1 05/24/2022   PLT 238.0 05/24/2022   GLUCOSE 98 06/06/2023   CHOL 106 06/06/2023   TRIG 70.0 06/06/2023   HDL 37.70 (L) 06/06/2023   LDLDIRECT 59.0 06/06/2023   LDLCALC 54 06/06/2023   ALT 33 06/06/2023   AST 18 06/06/2023   NA 143 06/06/2023   K 4.0 06/06/2023   CL 106 06/06/2023   CREATININE 0.97 06/06/2023   BUN 11 06/06/2023   CO2 28 06/06/2023   TSH 1.01 11/29/2022   PSA 0.54 06/06/2023   HGBA1C 5.6 06/06/2023   MICROALBUR 5.9 (H) 06/06/2023    US Venous Img Lower Unilateral Left (DVT)  Result Date: 03/28/2023 CLINICAL DATA:  Left lower extremity pain and swelling EXAM: LEFT LOWER EXTREMITY VENOUS DOPPLER ULTRASOUND TECHNIQUE: Gray-scale sonography with compression, as well as color and duplex ultrasound, were performed to evaluate the deep venous system(s) from the level of the common femoral vein through the popliteal and proximal calf veins. COMPARISON:  None available FINDINGS: VENOUS Normal compressibility of the common femoral, superficial femoral, and popliteal veins, as well as the visualized calf veins. Visualized portions of profunda femoral vein and  great saphenous vein unremarkable. No filling defects to suggest DVT on grayscale or color Doppler imaging. Doppler waveforms show normal direction of venous flow, normal respiratory plasticity and response to augmentation. Limited views of the contralateral common femoral vein are unremarkable. OTHER None. Limitations: none IMPRESSION: No DVT of the left lower extremity. Electronically Signed   By: Acquanetta Belling M.D.   On: 03/28/2023 15:17    Assessment & Plan:  .Essential hypertension -     Comprehensive metabolic panel -     Microalbumin / creatinine urine ratio  Mixed hyperlipidemia -     Lipid panel -     LDL cholesterol, direct  Prostate cancer screening -     PSA  Diarrhea of presumed infectious origin Assessment & Plan: History of campylobacter in January,  with return of symptoms.  Stool studies pending   Orders: -     GI pathogen panel by PCR, stool; Future -     C Difficile Quick Screen w PCR reflex; Future  Moderate episode of recurrent major depressive disorder (HCC)  Inflammatory polyarthropathy (HCC)  Aortic atherosclerosis (HCC)  Impaired fasting glucose -     Hemoglobin A1c  Need for influenza vaccination -     Flu vaccine trivalent PF, 6mos and older(Flulaval,Afluria,Fluarix,Fluzone)  Eosinophilic esophagitis Assessment & Plan: Diagnosed by esophageal biopsy done by Dr Loreta Ave.  Continue Advair.  Prescribing Nystatin suspension for once daily use to prevent  thrush    Other orders -     Nystatin; Take 5 mLs (500,000 Units total) by mouth 4 (four) times daily.  Dispense: 60 mL; Refill: 0 -     Lansoprazole; Take 1 capsule (30 mg total) by mouth daily at 12 noon.  Dispense: 90 capsule; Refill: 1     I provided 44 minutes of face-to-face time during this encounter reviewing patient's last visit with me, patient's  most recent visit with Gastroenterology, recent surgical and non surgical  procedures, previous  labs and imaging studies, counseling on currently  addressed issues,  and post visit ordering to diagnostics and therapeutics .   Follow-up: No follow-ups on file.   Sherlene Shams, MD

## 2023-06-06 NOTE — Assessment & Plan Note (Signed)
Diagnosed by esophageal biopsy done by Dr Loreta Ave.  Continue Advair.  Prescribing Nystatin suspension for once daily use to prevent  thrush

## 2023-06-07 ENCOUNTER — Telehealth: Payer: Self-pay

## 2023-06-07 ENCOUNTER — Other Ambulatory Visit: Payer: Self-pay | Admitting: Internal Medicine

## 2023-06-07 DIAGNOSIS — A09 Infectious gastroenteritis and colitis, unspecified: Secondary | ICD-10-CM | POA: Insufficient documentation

## 2023-06-07 DIAGNOSIS — R197 Diarrhea, unspecified: Secondary | ICD-10-CM

## 2023-06-07 LAB — GASTROINTESTINAL PANEL BY PCR, STOOL (REPLACES STOOL CULTURE)
Adenovirus F40/41: NOT DETECTED
Astrovirus: NOT DETECTED
Campylobacter species: NOT DETECTED
Cryptosporidium: NOT DETECTED
Cyclospora cayetanensis: NOT DETECTED
Entamoeba histolytica: NOT DETECTED
Enteroaggregative E coli (EAEC): DETECTED — AB
Enteropathogenic E coli (EPEC): DETECTED — AB
Enterotoxigenic E coli (ETEC): NOT DETECTED
Giardia lamblia: NOT DETECTED
Norovirus GI/GII: NOT DETECTED
Plesimonas shigelloides: NOT DETECTED
Rotavirus A: NOT DETECTED
Salmonella species: NOT DETECTED
Sapovirus (I, II, IV, and V): NOT DETECTED
Shiga like toxin producing E coli (STEC): NOT DETECTED
Shigella/Enteroinvasive E coli (EIEC): NOT DETECTED
Vibrio cholerae: NOT DETECTED
Vibrio species: NOT DETECTED
Yersinia enterocolitica: NOT DETECTED

## 2023-06-07 MED ORDER — CIPROFLOXACIN HCL 500 MG PO TABS
500.0000 mg | ORAL_TABLET | Freq: Two times a day (BID) | ORAL | 0 refills | Status: DC
Start: 2023-06-07 — End: 2023-09-20

## 2023-06-07 NOTE — Assessment & Plan Note (Signed)
STOOL IS POSITIVE FOR  E COLI  .  GIVEN THE DURATION OF SYMPTOMS WILL TREAT WITH CIPRO .

## 2023-06-07 NOTE — Telephone Encounter (Signed)
Patient states he had some tests and the test results show he has ecoli.  Patient states he would like to know what Dr. Duncan Dull recommends.  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  Patient states he would like for [place] to please call him.

## 2023-06-07 NOTE — Assessment & Plan Note (Signed)
Rx cipro for  E coli isolated from stool studies

## 2023-06-07 NOTE — Telephone Encounter (Signed)
Spoke with pt to let him know of the medication that he needs to start. Pt would like to know if he could get a work note for the next 3 three days. He stated that Cipro can cause him to have diarrhea and since he is already having diarrhea he doesn't think he will be able to work since he is a [employer]. Is it okay to give him a work note?

## 2023-06-07 NOTE — Telephone Encounter (Signed)
Received a call report for pt's GI panel. The GI pathogen panel was positive for Enteroaggregative E coli and Enteropathogenic E coli.

## 2023-06-07 NOTE — Telephone Encounter (Signed)
Pt is aware and said thank you.

## 2023-06-14 ENCOUNTER — Other Ambulatory Visit: Payer: Self-pay | Admitting: Internal Medicine

## 2023-06-30 ENCOUNTER — Other Ambulatory Visit: Payer: Self-pay | Admitting: Internal Medicine

## 2023-07-07 ENCOUNTER — Telehealth: Payer: Self-pay | Admitting: Internal Medicine

## 2023-07-07 DIAGNOSIS — K200 Eosinophilic esophagitis: Secondary | ICD-10-CM

## 2023-07-07 NOTE — Telephone Encounter (Signed)
Pt called stating [facility] on the wixela medication and he would like a 90 day supply on it plus a 90 day supply on nystatin

## 2023-07-08 ENCOUNTER — Other Ambulatory Visit: Payer: Self-pay | Admitting: Internal Medicine

## 2023-07-08 NOTE — Telephone Encounter (Signed)
Not in current medication list

## 2023-07-11 MED ORDER — FLUTICASONE-SALMETEROL 250-50 MCG/ACT IN AEPB: 1.0000 | INHALATION_SPRAY | Freq: Two times a day (BID) | RESPIRATORY_TRACT | 3 refills | Status: DC

## 2023-07-11 MED ORDER — NYSTATIN 100000 UNIT/ML MT SUSP: 5.0000 mL | Freq: Two times a day (BID) | OROMUCOSAL | 3 refills | Status: AC

## 2023-07-11 NOTE — Telephone Encounter (Signed)
Is it okay to send in the [facility]?

## 2023-07-11 NOTE — Telephone Encounter (Signed)
Spoke with pt to let him know that these medications would not be refilled. Pt stated that he uses the [facility] daily for his throat previously prescribed by Dr. Loreta Ave and Dr. Darrick Huntsman had prescribed the nystatin for him to keep on hand because he swallows the [facility] and sometimes gets thrush from it. Pt would like to know if Dr. Darrick Huntsman would be willing to prescribe the Wixela because he doesn't like dealing with Dr. Kenna Gilbert office. If okay he would like a 90 day supply sent to [facility].

## 2023-07-11 NOTE — Addendum Note (Signed)
Addended by: Sherlene Shams on: 07/11/2023 01:04 PM   Modules accepted: Orders

## 2023-07-11 NOTE — Assessment & Plan Note (Signed)
Diagnosed by esophageal biopsy done by Dr Loreta Ave.  Managed with Wixela .  Prescribing Nystatin suspension for once daily use to prevent  thrush

## 2023-07-11 NOTE — Telephone Encounter (Signed)
Pt is aware and gave a verbal understanding.

## 2023-07-11 NOTE — Telephone Encounter (Signed)
Tramadol refilled: 04/29/2023 Clonazepam refilled: 04/29/2023  Last OV: 06/06/2023 Next OV: 12/05/2023

## 2023-07-11 NOTE — Telephone Encounter (Signed)
THANKS FOR CLARIFYING.  90 DAY SUPPLY SENT TO [facility]

## 2023-08-11 ENCOUNTER — Telehealth: Payer: Self-pay

## 2023-08-11 NOTE — Telephone Encounter (Signed)
Copied from CRM [id number]. Topic: Clinical - Medical Advice >> Aug 11, 2023  1:49 PM Elizebeth Brooking wrote: Reason for CRM: Patient would like to speak with Dr.Tullo or Nurses in regards to stomach issues wanted to know if he could get another kit.

## 2023-08-11 NOTE — Telephone Encounter (Signed)
Attempted to call pt. Mail box is full.  

## 2023-08-22 NOTE — Telephone Encounter (Signed)
 Pt called back and scheduled an appt for 09/05/2023.

## 2023-09-05 ENCOUNTER — Ambulatory Visit: Payer: BC Managed Care – PPO | Admitting: Internal Medicine

## 2023-09-12 ENCOUNTER — Ambulatory Visit: Payer: BC Managed Care – PPO | Admitting: Internal Medicine

## 2023-09-12 ENCOUNTER — Encounter: Payer: Self-pay | Admitting: Internal Medicine

## 2023-09-12 DIAGNOSIS — F331 Major depressive disorder, recurrent, moderate: Secondary | ICD-10-CM

## 2023-09-12 DIAGNOSIS — M064 Inflammatory polyarthropathy: Secondary | ICD-10-CM | POA: Diagnosis not present

## 2023-09-12 DIAGNOSIS — Z6836 Body mass index (BMI) 36.0-36.9, adult: Secondary | ICD-10-CM

## 2023-09-12 DIAGNOSIS — R197 Diarrhea, unspecified: Secondary | ICD-10-CM

## 2023-09-12 MED ORDER — DIPHENOXYLATE-ATROPINE 2.5-0.025 MG PO TABS: 1.0000 | ORAL_TABLET | Freq: Four times a day (QID) | ORAL | 0 refills | Status: DC | PRN

## 2023-09-12 MED ORDER — SYRINGE 25G X 1" 3 ML MISC: 0 refills | Status: AC

## 2023-09-12 MED ORDER — CYANOCOBALAMIN 1000 MCG/ML IJ SOLN: INTRAMUSCULAR | 1 refills | Status: AC

## 2023-09-12 MED ORDER — NEEDLE (DISP) 25G X 1" MISC: 0 refills | Status: DC

## 2023-09-12 MED ORDER — ROSUVASTATIN CALCIUM 5 MG PO TABS: 5.0000 mg | ORAL_TABLET | Freq: Every day | ORAL | 1 refills | Status: DC

## 2023-09-12 NOTE — Assessment & Plan Note (Addendum)
Repeating infectious workup.  If negative ,  will recommend trial of an autoimmune paleo diet

## 2023-09-12 NOTE — Progress Notes (Signed)
Subjective:  Patient ID: Ronald Cordova, male    DOB: Oct 28, 1965  Age: 58 y.o. MRN: [id number]  CC: The primary encounter diagnosis was Obesity, morbid (HCC). Diagnoses of Moderate episode of recurrent major depressive disorder (HCC), Inflammatory polyarthropathy (HCC), and Diarrhea of presumed infectious origin were also pertinent to this visit.   HPI Ronald Cordova presents for  Chief Complaint  Patient presents with   Medical Management of Chronic Issues    Follow up on stomach issues  too 58 yr old male with diverticulosis  and Tubular adenomas by 2017 colonoscopy (follow up in 2022 not done) , treated for infectious colitis in late October with cipro,  presents with change in bowel habits since then. Stools became soft solid  but would alternative between loose stools.  He has had semi solid and loose stools with urgency occurring multiple times daily . Tried the [employer] for at least a week during the treatment .   Has not lost weight , has actually gained weigh.  Has trouble staying clean and oozing between stools. Has been using imodium because although Lomotil is effective it makes him feel "weird' no nocturnal voids    No recent travel .  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  Has not tried fasting   IBS/diarrhea:  no longer taking Viberzi prescribed by Marisue Brooklyn for IBS diarrhea  didn't help.   Gets nauseated,  flushed/hot,  sometimes feel that the food is coming back up      Outpatient Medications Prior to Visit  Medication Sig Dispense Refill   celecoxib (CELEBREX) 200 MG capsule Take 200 mg by mouth daily.     clonazePAM (KLONOPIN) 1 MG tablet TAKE 1.5 TABLETS (1.5 MG TOTAL) BY MOUTH DAILY AS NEEDED FOR ANXIETY 45 tablet 2   diphenoxylate-atropine (LOMOTIL) 2.5-0.025 MG tablet Take 1 tablet by mouth 4 (four) times daily as needed for diarrhea or loose stools. 30 tablet 0   doxazosin (CARDURA) 2 MG tablet TAKE 1 TABLET DAILY 90 tablet 1    Eluxadoline (VIBERZI) 100 MG TABS Take 2 tablets by mouth daily.     ezetimibe (ZETIA) 10 MG tablet TAKE 1 TABLET DAILY 90 tablet 2   famotidine (PEPCID) 40 MG tablet Take 40 mg by mouth 2 (two) times daily as needed for heartburn or indigestion.     fluticasone (FLONASE) 50 MCG/ACT nasal spray Place 2 sprays into both nostrils daily. 16 g 0   fluticasone-salmeterol (WIXELA INHUB) 250-50 MCG/ACT AEPB Inhale 1 puff into the lungs in the morning and at bedtime. 180 each 3   lansoprazole (PREVACID) 30 MG capsule Take 1 capsule (30 mg total) by mouth daily at 12 noon. 90 capsule 1   losartan-hydrochlorothiazide (HYZAAR) 100-12.5 MG tablet TAKE 1 TABLET DAILY 90 tablet 2   nystatin (MYCOSTATIN) 100000 UNIT/ML suspension Take 5 mLs (500,000 Units total) by mouth 4 (four) times daily. 60 mL 0   nystatin (MYCOSTATIN) 100000 UNIT/ML suspension Take 5 mLs (500,000 Units total) by mouth 2 (two) times daily. AFTER USE OF INHALER 900 mL 3   nystatin cream (MYCOSTATIN) APPLY TO AFFECTED AREA TWICE A DAY 30 g 0   ondansetron (ZOFRAN ODT) 4 MG disintegrating tablet Take 1 tablet (4 mg total) by mouth every 8 (eight) hours as needed for nausea or vomiting. 20 tablet 0   RETIN-A 0.01 % gel      scopolamine (TRANSDERM-SCOP) 1 MG/3DAYS Place 1 patch (1.5 mg total)  onto the skin every 3 (three) days. 10 patch 0   sildenafil (REVATIO) 20 MG tablet TAKE 3 TO 5 TABLETS BY MOUTH DAILY AS NEEDED 90 tablet 0   Spacer/Aero-Holding Chambers (AEROCHAMBER PLUS) inhaler Use with inhaler 1 each 2   SYRINGE-NEEDLE, DISP, 3 ML (LUER LOCK SAFETY SYRINGES) 25G X 1" 3 ML MISC Use to give b12 injection. 16 each 0   Syringe/Needle, Disp, (SYRINGE 3CC/18GX1-1/2") 18G X 1-1/2" 3 ML MISC Use to draw up testosterone 10 each 0   Syringe/Needle, Disp, (SYRINGE 3CC/22GX1-1/2") 22G X 1-1/2" 3 ML MISC Use to administer testosterone  every 2 weeks 10 each 5   testosterone cypionate (DEPOTESTOSTERONE CYPIONATE) 200 MG/ML injection INJECT 1 ML (200  MG TOTAL) INTO THE MUSCLE EVERY 14 DAYS 6 mL 1   tiZANidine (ZANAFLEX) 4 MG tablet Take 1 tablet (4 mg total) by mouth every 8 (eight) hours as needed for muscle spasms. 60 tablet 5   traMADol (ULTRAM) 50 MG tablet TAKE 1 TABLET (50 MG TOTAL) BY MOUTH EVERY 12 (TWELVE) HOURS AS NEEDED FOR MODERATE PAIN. 60 tablet 5   triamcinolone ointment (KENALOG) 0.1 % Apply 1 application topically 2 (two) times daily.     Vitamin D, Ergocalciferol, (DRISDOL) 1.25 MG (50000 UNIT) CAPS capsule TAKE 1 CAPSULE ONCE WEEKLY 12 capsule 0   cyanocobalamin (VITAMIN B12) 1000 MCG/ML injection INJECT 1 ML INTRAMUSCULARLY ONCE A WEEK FOR 4 WEEKS AND THEN ONCE MONTHLY. 12 mL 1   NEEDLE, DISP, 25 G 25G X 1" MISC Use to draw up 1 ml of Vitamin b12 16 each 0   rosuvastatin (CRESTOR) 5 MG tablet TAKE 1 TABLET DAILY 90 tablet 1   Syringe/Needle, Disp, (SYRINGE 3CC/25GX1") 25G X 1" 3 ML MISC Use for b12 injections 50 each 0   No facility-administered medications prior to visit.    Review of Systems;  Patient denies headache, fevers, malaise, unintentional weight loss, skin rash, eye pain, sinus congestion and sinus pain, sore throat, dysphagia,  hemoptysis , cough, dyspnea, wheezing, chest pain, palpitations, orthopnea, edema, abdominal pain, nausea, melena, diarrhea, constipation, flank pain, dysuria, hematuria, urinary  Frequency, nocturia, numbness, tingling, seizures,  Focal weakness, Loss of consciousness,  Tremor, insomnia, depression, anxiety, and suicidal ideation.      Objective:  BP 128/74   Pulse 82   Ht 5\' 9"  (1.753 m)   Wt 245 lb 6.4 oz (111.3 kg)   SpO2 97%   BMI 36.24 kg/m   BP Readings from Last 3 Encounters:  09/12/23 128/74  06/06/23 124/82  03/28/23 122/78    Wt Readings from Last 3 Encounters:  09/12/23 245 lb 6.4 oz (111.3 kg)  06/06/23 237 lb 6.4 oz (107.7 kg)  03/28/23 237 lb (107.5 kg)    Physical Exam Vitals reviewed.  Constitutional:      General: He is not in acute distress.     Appearance: Normal appearance. He is normal weight. He is not ill-appearing, toxic-appearing or diaphoretic.  HENT:     Head: Normocephalic.  Eyes:     General: No scleral icterus.       Right eye: No discharge.        Left eye: No discharge.     Conjunctiva/sclera: Conjunctivae normal.  Cardiovascular:     Rate and Rhythm: Normal rate and regular rhythm.     Heart sounds: Normal heart sounds.  Pulmonary:     Effort: Pulmonary effort is normal. No respiratory distress.     Breath sounds: Normal breath sounds.  Musculoskeletal:        General: Normal range of motion.     Cervical back: Normal range of motion.  Skin:    General: Skin is warm and dry.  Neurological:     General: No focal deficit present.     Mental Status: He is alert and oriented to person, place, and time. Mental status is at baseline.  Psychiatric:        Mood and Affect: Mood normal.        Behavior: Behavior normal.        Thought Content: Thought content normal.        Judgment: Judgment normal.    Lab Results  Component Value Date   HGBA1C 5.6 06/06/2023   HGBA1C 5.5 11/29/2022   HGBA1C 5.7 11/16/2021    Lab Results  Component Value Date   CREATININE 0.97 06/06/2023   CREATININE 0.90 11/29/2022   CREATININE 0.87 05/24/2022    Lab Results  Component Value Date   WBC 4.2 05/24/2022   HGB 14.1 05/24/2022   HCT 43.1 05/24/2022   PLT 238.0 05/24/2022   GLUCOSE 98 06/06/2023   CHOL 106 06/06/2023   TRIG 70.0 06/06/2023   HDL 37.70 (L) 06/06/2023   LDLDIRECT 59.0 06/06/2023   LDLCALC 54 06/06/2023   ALT 33 06/06/2023   AST 18 06/06/2023   NA 143 06/06/2023   K 4.0 06/06/2023   CL 106 06/06/2023   CREATININE 0.97 06/06/2023   BUN 11 06/06/2023   CO2 28 06/06/2023   TSH 1.01 11/29/2022   PSA 0.54 06/06/2023   HGBA1C 5.6 06/06/2023   MICROALBUR 5.9 (H) 06/06/2023    No results found.  Assessment & Plan:  .Obesity, morbid (HCC)  Moderate episode of recurrent major depressive disorder  (HCC)  Inflammatory polyarthropathy (HCC)  Diarrhea of presumed infectious origin Assessment & Plan: Repeating infectious workup.  If negative ,  will recommend trial of an autoimmune paleo diet   Orders: -     GI pathogen panel by PCR, stool; Future -     C Difficile Quick Screen w PCR reflex; Future  Other orders -     Cyanocobalamin; Inject 1 mL intramuscularly once a week for 4 weeks and then once monthly.  Dispense: 12 mL; Refill: 1 -     Needle (Disp); Use to draw up 1 ml of Vitamin b12  Dispense: 16 each; Refill: 0 -     Rosuvastatin Calcium; Take 1 tablet (5 mg total) by mouth daily.  Dispense: 90 tablet; Refill: 1 -     Syringe; Use for b12 injections  Dispense: 50 each; Refill: 0 -     Diphenoxylate-Atropine; Take 1 tablet by mouth 4 (four) times daily as needed for diarrhea or loose stools.  Dispense: 30 tablet; Refill: 0     I provided 37 minutes of face-to-face time during this encounter reviewing patient's last visit with me, patient's  most recent visit with gastroenterology ,  recent surgical and non surgical procedures, previous  labs and imaging studies, counseling on currently addressed issues,  and post visit ordering to diagnostics and therapeutics .   Follow-up: No follow-ups on file.   Sherlene Shams, MD

## 2023-09-12 NOTE — Patient Instructions (Addendum)
Try fasting for 24 hours to see if it affects your stool  You can try taking IBGUARD  for the abdominal symptoms  Suspend zetia for two weeks  to see if the diarrhea resolves  You can try Digestive Enzymes as well   Keep Gas X or Phazyme (simethicone)  handy to use for gas.   The stools can be collected the night before and kept chilled   The book I referred to today when we were discussing your GI issues is Dr Gordy Councilman Bennett's book "AIP Diet for Beginners: A Comprehensive Guide to the Autoimmune Paleo protocol"

## 2023-09-18 NOTE — Progress Notes (Unsigned)
Cardiology Office Note  Date:  09/19/2023   ID:  Ronald Cordova, DOB 1966/07/24, MRN [id number]  PCP:  Sherlene Shams, MD   Chief Complaint  Patient presents with   12 month follow up     "Doing well."     HPI:  Ronald Cordova is a 58 y.o. male with past medical history of chest pain  shortness of breath Hypertension Hyperlipidemia Palpitations Anxiety family history: brother died suddenly and was told it was from a massive heart attack, at age of 42,  Previously seen by Dr. 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, has a Animator that drives with him, splits time  Stressful 2024, mother passed (dementia), dog passed Reports he was sedentary for 6 months after the stressors  Weight has trended up over the past several years No regular exercise program  No significant chest pain or shortness of breath concerning for angina  Prior smoking history, current non-smoker  Lab work reviewed A1C 5.6 Total chol 106, LDL 59, tolerating Zetia and Crestor 5 daily Testosterone 60, received a testosterone shot   EKG personally reviewed by myself on todays visit EKG Interpretation Date/Time:  Monday September 19 2023 14:04:44 EST Ventricular Rate:  103 PR Interval:  146 QRS Duration:  76 QT Interval:  336 QTC Calculation: 440 R Axis:   17  Text Interpretation: Sinus tachycardia When compared with ECG of 26-Mar-2022 22:06, No significant change was found Confirmed by Julien Nordmann [phone]) on 09/19/2023 2:06:49 PM   Other past medical history reviewed emergency room July 2018 for chest pain abdominal tenderness   CT scan chest July 2013 Renal CT July 2018 Previous stress test June 2017 no ischemia  CT scan Renal CT scan and chest CT  2013 No coronary calcifications noted He does have mild distal descending aorta and mild common iliac calcification  PMH:   has a past medical history of Anxiety state, unspecified, Arthritis, Chest pain with moderate risk for cardiac etiology (01/21/2012), Community acquired pneumonia (05/18/2021), Dental crowns present, Eosinophilic esophagitis (2010), GERD (gastroesophageal reflux disease), Hemorrhoids, Hypertension, Irritable bowel syndrome, Polyp of colon, hyperplastic (01/08/2011), PONV (postoperative nausea and vomiting), Shoulder pain, left, Treadmill stress test negative for angina pectoris (2003), and Vertigo.  PSH:    Past Surgical History:  Procedure Laterality Date   APPENDECTOMY     done during high school   carpal tunnel release  Sept 2008   R hand, by  [place], [place]    ETHMOIDECTOMY Bilateral 03/16/2022   Procedure: TOTAL ETHMOIDECTOMY-LEFT REVISION ETHMOIDECTOMY-RIGHT;  Surgeon: Geanie Logan, MD;  Location: [facility];  Service: ENT;  Laterality: Bilateral;   FRONTAL SINUS EXPLORATION Left 03/16/2022   Procedure: FRONTAL SINUS EXPLORATION;  Surgeon: Geanie Logan, MD;  Location: [facility];  Service: ENT;  Laterality: Left;   IMAGE GUIDED SINUS SURGERY N/A 03/16/2022   Procedure: IMAGE GUIDED SINUS SURGERY;  Surgeon: Geanie Logan, MD;  Location: [facility];  Service: ENT;  Laterality: N/A;  STRYKER DISK ON OR CHARGE NURSE DESK 7-7 KP   JOINT REPLACEMENT     KNEE ARTHROSCOPY  2000   left   MAXILLARY ANTROSTOMY Left 03/16/2022   Procedure: MAXILLARY ANTROSTOMY WITH TISSUE REMOVAL;  Surgeon: Geanie Logan, MD;  Location: [facility];  Service: ENT;  Laterality: Left;   NASAL SINUS SURGERY  1998   bilateral   SPHENOIDECTOMY Bilateral 03/16/2022   Procedure: SPHENOIDECTOMY WITH TISSUE REMOVAL;  Surgeon: Geanie Logan, MD;  Location: [facility];  Service: ENT;  Laterality: Bilateral;    Current Outpatient Medications  Medication Sig  Dispense Refill   celecoxib (CELEBREX) 200 MG capsule Take 200 mg by mouth daily.     clonazePAM (KLONOPIN) 1 MG tablet TAKE 1.5 TABLETS (1.5 MG TOTAL) BY MOUTH DAILY AS NEEDED FOR ANXIETY 45 tablet 2   cyanocobalamin (VITAMIN B12) 1000 MCG/ML injection Inject 1 mL intramuscularly once a week for 4 weeks and then once monthly. 12 mL 1   diphenoxylate-atropine (LOMOTIL) 2.5-0.025 MG tablet Take 1 tablet by mouth 4 (four) times daily as needed for diarrhea or loose stools. 30 tablet 0   diphenoxylate-atropine (LOMOTIL) 2.5-0.025 MG tablet Take 1 tablet by mouth 4 (four) times daily as needed for diarrhea or loose stools. 30 tablet 0   doxazosin (CARDURA) 2 MG tablet TAKE 1 TABLET DAILY 90 tablet 1   Eluxadoline (VIBERZI) 100 MG TABS Take 2 tablets by mouth daily.     ezetimibe (ZETIA) 10 MG tablet TAKE 1 TABLET DAILY 90 tablet 2   famotidine (PEPCID) 40 MG tablet Take 40 mg by mouth 2 (two) times daily as needed for heartburn or indigestion.     fluticasone (FLONASE) 50 MCG/ACT nasal spray Place 2 sprays into both nostrils daily. 16 g 0   fluticasone-salmeterol (WIXELA INHUB) 250-50 MCG/ACT AEPB Inhale 1 puff into the lungs in the morning and at bedtime. 180 each 3   lansoprazole (PREVACID) 30 MG capsule Take 1 capsule (30 mg total) by mouth daily at 12 noon. 90 capsule 1   losartan-hydrochlorothiazide (HYZAAR) 100-12.5 MG tablet TAKE 1 TABLET DAILY 90 tablet 2   NEEDLE, DISP, 25 G 25G X 1" MISC Use to draw up 1 ml of Vitamin b12 16 each 0   nystatin (MYCOSTATIN) 100000 UNIT/ML suspension Take 5 mLs (500,000 Units total) by mouth 4 (four) times daily. 60 mL 0   nystatin (MYCOSTATIN) 100000 UNIT/ML suspension Take 5 mLs (500,000 Units total) by mouth 2 (two) times daily. AFTER USE OF INHALER 900 mL 3   nystatin cream (MYCOSTATIN) APPLY TO AFFECTED AREA TWICE A DAY 30 g 0   ondansetron (ZOFRAN ODT) 4 MG disintegrating tablet Take 1 tablet (4 mg total) by mouth every 8 (eight) hours as needed for nausea  or vomiting. 20 tablet 0   RETIN-A 0.01 % gel      rosuvastatin (CRESTOR) 5 MG tablet Take 1 tablet (5 mg total) by mouth daily. 90 tablet 1   scopolamine (TRANSDERM-SCOP) 1 MG/3DAYS Place 1 patch (1.5 mg total) onto the skin every 3 (three) days. 10 patch 0   sildenafil (REVATIO) 20 MG tablet TAKE 3 TO 5 TABLETS BY MOUTH DAILY AS NEEDED 90 tablet 0   Spacer/Aero-Holding Chambers (AEROCHAMBER PLUS) inhaler Use with inhaler 1 each 2   SYRINGE-NEEDLE, DISP, 3 ML (LUER LOCK SAFETY SYRINGES) 25G X 1" 3 ML MISC Use to give b12 injection. 16 each 0   Syringe/Needle, Disp, (SYRINGE 3CC/18GX1-1/2") 18G X 1-1/2" 3 ML MISC Use to draw up testosterone 10 each 0   Syringe/Needle, Disp, (SYRINGE 3CC/22GX1-1/2") 22G X 1-1/2" 3 ML MISC Use to administer testosterone  every 2 weeks 10 each 5   Syringe/Needle, Disp, (SYRINGE 3CC/25GX1") 25G X 1" 3 ML MISC Use for b12 injections 50 each 0  testosterone cypionate (DEPOTESTOSTERONE CYPIONATE) 200 MG/ML injection INJECT 1 ML (200 MG TOTAL) INTO THE MUSCLE EVERY 14 DAYS 6 mL 1   tiZANidine (ZANAFLEX) 4 MG tablet Take 1 tablet (4 mg total) by mouth every 8 (eight) hours as needed for muscle spasms. 60 tablet 5   traMADol (ULTRAM) 50 MG tablet TAKE 1 TABLET (50 MG TOTAL) BY MOUTH EVERY 12 (TWELVE) HOURS AS NEEDED FOR MODERATE PAIN. 60 tablet 5   triamcinolone ointment (KENALOG) 0.1 % Apply 1 application topically 2 (two) times daily.     Vitamin D, Ergocalciferol, (DRISDOL) 1.25 MG (50000 UNIT) CAPS capsule TAKE 1 CAPSULE ONCE WEEKLY 12 capsule 0   No current facility-administered medications for this visit.    Allergies:   Clindamycin/lincomycin, Penicillin g, and Penicillins   Social History:  The patient  reports that he quit smoking about 12 years ago. His smoking use included cigarettes. He started smoking about 27 years ago. He has a 15 pack-year smoking history. He has never used smokeless tobacco. He reports current alcohol use. He reports that he does not use  drugs.   Family History:   family history includes BRCA 1/2 in his maternal aunt; Heart attack (age of onset: 53) in his brother; Hyperlipidemia in his brother, father, mother, and sister; Hypertension in his father and mother.    Review of Systems: Review of Systems  Constitutional: Negative.   Respiratory: Negative.    Cardiovascular: Negative.   Gastrointestinal: Negative.   Musculoskeletal: Negative.   Neurological: Negative.   Psychiatric/Behavioral: Negative.    All other systems reviewed and are negative.  PHYSICAL EXAM: VS:  BP 110/70 (BP Location: Left Arm, Patient Position: Sitting, Cuff Size: Large)   Pulse (!) 103   Ht 5\' 9"  (1.753 m)   Wt 251 lb 4 oz (114 kg)   BMI 37.10 kg/m  , BMI Body mass index is 37.1 kg/m. Constitutional:  oriented to person, place, and time. No distress.  HENT:  Head: Grossly normal Eyes:  no discharge. No scleral icterus.  Neck: No JVD, no carotid bruits  Cardiovascular: Regular rate and rhythm, no murmurs appreciated Pulmonary/Chest: Clear to auscultation bilaterally, no wheezes or rails Abdominal: Soft.  no distension.  no tenderness.  Musculoskeletal: Normal range of motion Neurological:  normal muscle tone. Coordination normal. No atrophy Skin: Skin warm and dry Psychiatric: normal affect, pleasant  Recent Labs: 11/29/2022: TSH 1.01 06/06/2023: ALT 33; BUN 11; Creatinine, Ser 0.97; Potassium 4.0; Sodium 143    Lipid Panel Lab Results  Component Value Date   CHOL 106 06/06/2023   HDL 37.70 (L) 06/06/2023   LDLCALC 54 06/06/2023   TRIG 70.0 06/06/2023      Wt Readings from Last 3 Encounters:  09/19/23 251 lb 4 oz (114 kg)  09/12/23 245 lb 6.4 oz (111.3 kg)  06/06/23 237 lb 6.4 oz (107.7 kg)     ASSESSMENT AND PLAN:  Chest pain with moderate risk for cardiac etiology  Denies significant chest pain concerning for angina  low calcium score 14, in 2019 Lipids at goal, diabetes numbers well-controlled,  non-smoker Recommend regular walking program, low carbohydrate diet At his request repeat calcium scoring ordered  Pulmonary nodule First noted in 2013,  Not noted on repeat CT scan 2019  Mixed hyperlipidemia Crestor Zetia, cholesterol at goal  Essential hypertension Blood pressure is well controlled on today's visit. No changes made to the medications.  Palpitations Denies significant symptoms  Shortness of breath Stressed importance of walking program, low carbohydrate  diet  Aortic atherosclerosis Seen on CT scan, mild LDL at goal, no further testing  Weight gain We have encouraged continued exercise, careful diet management in an effort to lose weight.     Orders Placed This Encounter  Procedures   EKG 12-Lead     Signed, Dossie Arbour, M.D., Ph.D. 09/19/2023  [facility] [place], [place] [phone]

## 2023-09-19 ENCOUNTER — Other Ambulatory Visit
Admission: RE | Admit: 2023-09-19 | Discharge: 2023-09-19 | Disposition: A | Discharge status: Home / Self Care | Payer: BC Managed Care – PPO | Source: Ambulatory Visit | Attending: Internal Medicine | Admitting: Internal Medicine

## 2023-09-19 ENCOUNTER — Encounter: Payer: Self-pay | Admitting: Cardiovascular Disease

## 2023-09-19 ENCOUNTER — Ambulatory Visit: Payer: BC Managed Care – PPO | Attending: Cardiovascular Disease | Admitting: Cardiovascular Disease

## 2023-09-19 ENCOUNTER — Telehealth: Payer: Self-pay | Admitting: *Deleted

## 2023-09-19 VITALS — BP 110/70 | HR 103 | Ht 69.0 in | Wt 251.2 lb

## 2023-09-19 DIAGNOSIS — I10 Essential (primary) hypertension: Secondary | ICD-10-CM | POA: Diagnosis not present

## 2023-09-19 DIAGNOSIS — R197 Diarrhea, unspecified: Secondary | ICD-10-CM | POA: Diagnosis present

## 2023-09-19 DIAGNOSIS — I700 Atherosclerosis of aorta: Secondary | ICD-10-CM | POA: Diagnosis not present

## 2023-09-19 DIAGNOSIS — F17201 Nicotine dependence, unspecified, in remission: Secondary | ICD-10-CM

## 2023-09-19 DIAGNOSIS — E782 Mixed hyperlipidemia: Secondary | ICD-10-CM | POA: Diagnosis not present

## 2023-09-19 LAB — GASTROINTESTINAL PANEL BY PCR, STOOL (REPLACES STOOL CULTURE)
Adenovirus F40/41: NOT DETECTED
Astrovirus: NOT DETECTED
Campylobacter species: NOT DETECTED
Cryptosporidium: NOT DETECTED
Cyclospora cayetanensis: NOT DETECTED
Entamoeba histolytica: NOT DETECTED
Enteroaggregative E coli (EAEC): DETECTED — AB
Enteropathogenic E coli (EPEC): NOT DETECTED
Enterotoxigenic E coli (ETEC): NOT DETECTED
Giardia lamblia: NOT DETECTED
Norovirus GI/GII: DETECTED — AB
Plesimonas shigelloides: NOT DETECTED
Rotavirus A: NOT DETECTED
Salmonella species: NOT DETECTED
Sapovirus (I, II, IV, and V): NOT DETECTED
Shiga like toxin producing E coli (STEC): NOT DETECTED
Shigella/Enteroinvasive E coli (EIEC): NOT DETECTED
Vibrio cholerae: NOT DETECTED
Vibrio species: NOT DETECTED
Yersinia enterocolitica: NOT DETECTED

## 2023-09-19 LAB — C DIFFICILE QUICK SCREEN W PCR REFLEX
C Diff antigen: NEGATIVE
C Diff interpretation: NOT DETECTED
C Diff toxin: NEGATIVE

## 2023-09-19 NOTE — Telephone Encounter (Signed)
CRITICAL VALUE STICKER  CRITICAL VALUE: Stool Test-positive for: E.Coli & Norovirus GI 1 & 2.  RECEIVER (on-site recipient of call): Silvestre Moment, CMA  DATE & TIME NOTIFIED: 09/19/23 @ 2:38pm  MESSENGER (representative from lab): [place] @ [facility]  MD NOTIFIED: Dr Darrick Huntsman  TIME OF NOTIFICATION: 2:38pm  RESPONSE:

## 2023-09-19 NOTE — Patient Instructions (Addendum)
Medication Instructions:  No changes  If you need a refill on your cardiac medications before your next appointment, please call your pharmacy.   Lab work: No new labs needed  Testing/Procedures:  CT coronary calcium score.   - $99 out of pocket cost at the time of your test - Call [phone] to schedule at your convenience.  [facility] [address] [place], [place] [address]   Coronary CalciumScan A coronary calcium scan is an imaging test used to look for deposits of calcium and other fatty materials (plaques) in the inner lining of the blood vessels of the heart (coronary arteries). These deposits of calcium and plaques can partly clog and narrow the coronary arteries without producing any symptoms or warning signs. This puts a person at risk for a heart attack. This test can detect these deposits before symptoms develop. Tell a health care provider about: Any allergies you have. All medicines you are taking, including vitamins, herbs, eye drops, creams, and over-the-counter medicines. Any problems you or family members have had with anesthetic medicines. Any blood disorders you have. Any surgeries you have had. Any medical conditions you have. Whether you are pregnant or may be pregnant. What are the risks? Generally, this is a safe procedure. However, problems may occur, including: Harm to a pregnant woman and her unborn baby. This test involves the use of radiation. Radiation exposure can be dangerous to a pregnant woman and her unborn baby. If you are pregnant, you generally should not have this procedure done. Slight increase in the risk of cancer. This is because of the radiation involved in the test. What happens before the procedure? No preparation is needed for this procedure. What happens during the procedure? You will undress and remove any jewelry around your neck or chest. You will put on a hospital gown. Sticky  electrodes will be placed on your chest. The electrodes will be connected to an electrocardiogram (ECG) machine to record a tracing of the electrical activity of your heart. A CT scanner will take pictures of your heart. During this time, you will be asked to lie still and hold your breath for 2-3 seconds while a picture of your heart is being taken. The procedure may vary among health care providers and hospitals. What happens after the procedure? You can get dressed. You can return to your normal activities. It is up to you to get the results of your test. Ask your health care provider, or the department that is doing the test, when your results will be ready. Summary A coronary calcium scan is an imaging test used to look for deposits of calcium and other fatty materials (plaques) in the inner lining of the blood vessels of the heart (coronary arteries). Generally, this is a safe procedure. Tell your health care provider if you are pregnant or may be pregnant. No preparation is needed for this procedure. A CT scanner will take pictures of your heart. You can return to your normal activities after the scan is done. This information is not intended to replace advice given to you by your health care provider. Make sure you discuss any questions you have with your health care provider. Document Released: 01/29/2008 Document Revised: 06/21/2016 Document Reviewed: 06/21/2016 Elsevier Interactive Patient Education  2017 [employer].   Follow-Up: At [facility], you and your health needs are our priority.  As part of our continuing mission to provide you with exceptional heart care, we have created designated  Provider Care Teams.  These Care Teams include your primary Cardiologist (physician) and Advanced Practice Providers (APPs -  Physician Assistants and Nurse Practitioners) who all work together to provide you with the care you need, when you need it.  You will need a follow up appointment in  12 months  Providers on your designated Care Team:   Nicolasa Ducking, NP Eula Listen, PA-C Cadence Fransico Michael, [place]  COVID-19 Vaccine Information can be found at: [email or web address] For questions related to vaccine distribution or appointments, please email [email or web address] or call [phone].

## 2023-09-19 NOTE — Telephone Encounter (Signed)
 See my chart response .

## 2023-09-20 ENCOUNTER — Encounter: Payer: Self-pay | Admitting: Internal Medicine

## 2023-09-20 ENCOUNTER — Other Ambulatory Visit: Payer: Self-pay | Admitting: Internal Medicine

## 2023-09-20 MED ORDER — CIPROFLOXACIN HCL 500 MG PO TABS: 500.0000 mg | ORAL_TABLET | Freq: Two times a day (BID) | ORAL | 0 refills | Status: AC

## 2023-09-21 ENCOUNTER — Telehealth: Payer: Self-pay | Admitting: *Deleted

## 2023-09-21 NOTE — Telephone Encounter (Signed)
 Copied from CRM [id number]. Topic: General - Other >> Sep 21, 2023  9:34 AM Corin V wrote: Reason for CRM: Patient has an appointment with [facility] tomorrow and they need labs and office notes from the last 12 months faxed to their office today. F: [phone]

## 2023-09-22 ENCOUNTER — Other Ambulatory Visit: Payer: Self-pay | Admitting: Internal Medicine

## 2023-09-22 DIAGNOSIS — B999 Unspecified infectious disease: Secondary | ICD-10-CM

## 2023-09-22 NOTE — Telephone Encounter (Signed)
 Spoke with pt to make sure he was okay with us  faxing the requested information to Dr. Nola office. Pt stated that he is fine with it. I have printed and faxed all requested information.    While on the phone with pt he had concerns of why he is having repeats of the E. Coli injection. He stated that he has been on the internet and read that it could be from being immunocompromised. Pt is wondering if he has something going on that he is not aware of and would like to know if he needs to be retested for HIV.

## 2023-09-26 NOTE — Telephone Encounter (Signed)
 Sent pt a [employer] geologist.

## 2023-09-28 ENCOUNTER — Other Ambulatory Visit (INDEPENDENT_AMBULATORY_CARE_PROVIDER_SITE_OTHER): Payer: BC Managed Care – PPO

## 2023-09-28 DIAGNOSIS — B999 Unspecified infectious disease: Secondary | ICD-10-CM | POA: Diagnosis not present

## 2023-09-28 NOTE — Telephone Encounter (Signed)
noted

## 2023-09-29 ENCOUNTER — Encounter: Payer: Self-pay | Admitting: Internal Medicine

## 2023-09-29 LAB — CBC WITH DIFFERENTIAL/PLATELET
Basophils Absolute: 0.1 10*3/uL (ref 0.0–0.1)
Basophils Relative: 1.2 % (ref 0.0–3.0)
Eosinophils Absolute: 0.2 10*3/uL (ref 0.0–0.7)
Eosinophils Relative: 2.5 % (ref 0.0–5.0)
HCT: 49 % (ref 39.0–52.0)
Hemoglobin: 16.1 g/dL (ref 13.0–17.0)
Lymphocytes Relative: 38.1 % (ref 12.0–46.0)
Lymphs Abs: 2.8 10*3/uL (ref 0.7–4.0)
MCHC: 33 g/dL (ref 30.0–36.0)
MCV: 93.8 fL (ref 78.0–100.0)
Monocytes Absolute: 0.6 10*3/uL (ref 0.1–1.0)
Monocytes Relative: 7.6 % (ref 3.0–12.0)
Neutro Abs: 3.7 10*3/uL (ref 1.4–7.7)
Neutrophils Relative %: 50.6 % (ref 43.0–77.0)
Platelets: 306 10*3/uL (ref 150.0–400.0)
RBC: 5.22 Mil/uL (ref 4.22–5.81)
RDW: 14.5 % (ref 11.5–15.5)
WBC: 7.4 10*3/uL (ref 4.0–10.5)

## 2023-09-29 LAB — IGG, IGA, IGM
IgG (Immunoglobin G), Serum: 1018 mg/dL (ref 600–1640)
IgM, Serum: 101 mg/dL (ref 50–300)
Immunoglobulin A: 155 mg/dL (ref 47–310)

## 2023-09-29 LAB — HIV ANTIBODY (ROUTINE TESTING W REFLEX): HIV 1&2 Ab, 4th Generation: NONREACTIVE

## 2023-10-03 ENCOUNTER — Encounter: Payer: Self-pay | Admitting: Internal Medicine

## 2023-10-03 LAB — HM COLONOSCOPY

## 2023-10-04 ENCOUNTER — Other Ambulatory Visit: Payer: Self-pay | Admitting: Internal Medicine

## 2023-10-05 ENCOUNTER — Ambulatory Visit: Admission: RE | Admit: 2023-10-05 | Payer: Self-pay | Source: Ambulatory Visit

## 2023-10-05 ENCOUNTER — Other Ambulatory Visit: Payer: Self-pay | Admitting: Internal Medicine

## 2023-10-06 ENCOUNTER — Ambulatory Visit
Admission: RE | Admit: 2023-10-06 | Discharge: 2023-10-06 | Disposition: A | Discharge status: Home / Self Care | Payer: Self-pay | Source: Ambulatory Visit | Attending: Cardiovascular Disease | Admitting: Cardiovascular Disease

## 2023-10-06 DIAGNOSIS — E782 Mixed hyperlipidemia: Secondary | ICD-10-CM | POA: Insufficient documentation

## 2023-10-07 ENCOUNTER — Encounter: Payer: Self-pay | Admitting: Cardiovascular Disease

## 2023-10-14 ENCOUNTER — Other Ambulatory Visit: Payer: Self-pay | Admitting: Internal Medicine

## 2023-10-17 ENCOUNTER — Other Ambulatory Visit: Payer: Self-pay

## 2023-10-17 ENCOUNTER — Encounter: Payer: Self-pay | Admitting: Emergency Medicine

## 2023-10-17 ENCOUNTER — Emergency Department

## 2023-10-17 DIAGNOSIS — F419 Anxiety disorder, unspecified: Secondary | ICD-10-CM | POA: Diagnosis not present

## 2023-10-17 DIAGNOSIS — R0789 Other chest pain: Secondary | ICD-10-CM | POA: Diagnosis present

## 2023-10-17 NOTE — ED Triage Notes (Addendum)
 Patient ambulatory to triage with steady gait, without difficulty or distress noted; pt reports this evening having left upper CP radiating into back accomp by [facility]; st hx anxiety; took rx klonopin hr PTA without relief

## 2023-10-18 ENCOUNTER — Emergency Department
Admission: EM | Admit: 2023-10-18 | Discharge: 2023-10-18 | Disposition: A | Discharge status: Home / Self Care | Attending: Emergency Medicine | Admitting: Emergency Medicine

## 2023-10-18 ENCOUNTER — Emergency Department

## 2023-10-18 DIAGNOSIS — F419 Anxiety disorder, unspecified: Secondary | ICD-10-CM

## 2023-10-18 DIAGNOSIS — R0789 Other chest pain: Secondary | ICD-10-CM

## 2023-10-18 LAB — BASIC METABOLIC PANEL
Anion gap: 11 (ref 5–15)
BUN: 10 mg/dL (ref 6–20)
CO2: 23 mmol/L (ref 22–32)
Calcium: 9.3 mg/dL (ref 8.9–10.3)
Chloride: 104 mmol/L (ref 98–111)
Creatinine, Ser: 0.85 mg/dL (ref 0.61–1.24)
GFR, Estimated: >60 mL/min (ref >60)
Glucose, Bld: 119 mg/dL — ABNORMAL HIGH (ref 70–99)
Potassium: 3.4 mmol/L — ABNORMAL LOW (ref 3.5–5.1)
Sodium: 138 mmol/L (ref 135–145)

## 2023-10-18 LAB — CBC
HCT: 44.1 % (ref 39.0–52.0)
Hemoglobin: 15.2 g/dL (ref 13.0–17.0)
MCH: 31.2 pg (ref 26.0–34.0)
MCHC: 34.5 g/dL (ref 30.0–36.0)
MCV: 90.6 fL (ref 80.0–100.0)
Platelets: 269 10*3/uL (ref 150–400)
RBC: 4.87 MIL/uL (ref 4.22–5.81)
RDW: 13.1 % (ref 11.5–15.5)
WBC: 9 10*3/uL (ref 4.0–10.5)
nRBC: 0 % (ref 0.0–0.2)

## 2023-10-18 LAB — TROPONIN I (HIGH SENSITIVITY)
Troponin I (High Sensitivity): 5 ng/L (ref <18)
Troponin I (High Sensitivity): 5 ng/L (ref <18)

## 2023-10-18 NOTE — Discharge Instructions (Signed)
 You have been seen in the Emergency Department (ED) today for chest pain.  As we have discussed today's test results are normal, and we feel it is likely that panic attacks may be causing your symptoms, or at least contributing to the musculoskeletal pain that you are experiencing.  Please follow up with the recommended doctor as instructed above in these documents regarding today's emergent visit and your recent symptoms to discuss further management.  Continue to take your regular medications.   Return to the Emergency Department (ED) if you experience any further chest pain/pressure/tightness, difficulty breathing, or sudden sweating, or other symptoms that concern you.

## 2023-10-18 NOTE — ED Provider Notes (Signed)
 [facility] Provider Note    Event Date/Time   First MD Initiated Contact with Patient 10/18/23 [phone]     (approximate)   History   Chest Pain   HPI Ronald Cordova is a 58 y.o. male  whose medical history includes anxiety and who has a family history of cardiac disease.  Presents for evaluation of chest wall pain associated with SOB.  States he is going through a lot of emotional and stressful issues.  His mother passed away a few years ago and he is still dealing with that because he states "I was a real momma's boy."  His father was diagnosed recently with non-alcoholic cirrhosis and is expected to not live much longer.  Patient is worried about his father's decline and eventual death.  States that he started feeling some chest pain in the left side of his chest but also in the back and accompanied with some shortness of breath.  He states that he tried taking one of his Klonopin but it did not help much although he feels better now except for some soreness on the side of his chest wall..  No recent long distance travel, no history of blood clots in the legs or the lungs, no unilateral leg pain or swelling.  He states that he has a cardiologist and had a reassuring cardiac workup recently.  He has a brother that died in his early 31s from a heart attack, but the patient has no specific cardiac diagnosis.      Physical Exam   Triage Vital Signs: ED Triage Vitals  Encounter Vitals Group     BP 10/17/23 2337 (!) 145/87     Systolic BP Percentile --      Diastolic BP Percentile --      Pulse Rate 10/17/23 2337 94     Resp 10/17/23 2337 18     Temp 10/17/23 2337 97.7 F (36.5 C)     Temp Source 10/17/23 2337 Oral     SpO2 10/17/23 2337 99 %     Weight 10/17/23 2335 108.9 kg (240 lb)     Height 10/17/23 2335 1.753 m (5\' 9" )     Head Circumference --      Peak Flow --      Pain Score 10/17/23 2335 2     Pain Loc --      Pain Education --      Exclude from  Growth Chart --     Most recent vital signs: Vitals:   10/17/23 2337  BP: (!) 145/87  Pulse: 94  Resp: 18  Temp: 97.7 F (36.5 C)  SpO2: 99%    General: Awake, no distress.  Well-appearing and comfortable at this time. CV:  Good peripheral perfusion.  Regular rate and rhythm, normal heart sounds. Resp:  Normal effort. Speaking easily and comfortably, no accessory muscle usage nor intercostal retractions.  Lungs are clear to auscultation bilaterally. Abd:  No distention.  No tenderness to palpation of the abdomen. Other:  Patient is having some reproducible chest wall pain in the left lateral and slightly posterior and upper chest when he moves around, thinks he pulled some muscles.   ED Results / Procedures / Treatments   Labs (all labs ordered are listed, but only abnormal results are displayed) Labs Reviewed  BASIC METABOLIC PANEL - Abnormal; Notable for the following components:      Result Value   Potassium 3.4 (*)    Glucose, Bld 119 (*)  All other components within normal limits  CBC  TROPONIN I (HIGH SENSITIVITY)  TROPONIN I (HIGH SENSITIVITY)     EKG  ED ECG REPORT I, Loleta Rose, the attending physician, personally viewed and interpreted this ECG.  Date: 10/17/2023 EKG Time: 23: 49 Rate: 88 Rhythm: normal sinus rhythm QRS Axis: normal Intervals: normal ST/T Wave abnormalities: normal Narrative Interpretation: no evidence of acute ischemia    RADIOLOGY I viewed and interpreted the patient's two-view chest x-ray and I see no evidence of pneumonia, pneumothorax, nor other acute abnormality.  I also read the radiologist's report, which confirmed no acute findings.   PROCEDURES:  Critical Care performed: No  Procedures    IMPRESSION / MDM / ASSESSMENT AND PLAN / ED COURSE  I reviewed the triage vital signs and the nursing notes.                              Differential diagnosis includes, but is not limited to, anxiety/panic attack,  musculoskeletal strain, ACS, PE, pneumonia.  Patient's presentation is most consistent with acute presentation with potential threat to life or bodily function.  Labs/studies ordered: EKG, two-view chest x-ray, high-sensitivity troponin x 2, basic metabolic panel, CBC  Interventions/Medications given:  Medications - No data to display  (Note:  hospital course my include additional interventions and/or labs/studies not listed above.)   Patient is well-appearing and in no distress with essentially normal vital signs other than some mild hypertension.  Labs are all reassuring including 2 high-sensitivity troponins, EKG without any ischemia, and chest x-ray is normal.  We had an extended conversation about his symptoms as well as all the stress he is going through and I am reassured that he is at very low risk of an emergent medical condition including ACS based on a low risk HEAR score and Wells Score for PE of zero (PERC rule does not apply since he is over age 59).  Patient is comfortable with the plan for discharge and outpatient follow-up as needed with PCP or with his cardiologist and I think that is reasonable and appropriate.  The patient's medical screening exam is reassuring with no indication of an emergent medical condition requiring hospitalization or additional evaluation at this point.  The patient is safe and appropriate for discharge and outpatient follow up.       FINAL CLINICAL IMPRESSION(S) / ED DIAGNOSES   Final diagnoses:  Atypical chest pain  Anxiety  Chest wall pain     Rx / DC Orders   ED Discharge Orders     None        Note:  This document was prepared using Dragon voice recognition software and may include unintentional dictation errors.   Loleta Rose, MD 10/18/23 [phone]

## 2023-10-18 NOTE — ED Notes (Signed)
 Spoke at length with pt regarding environmental stressors. Pt reports mild CP.

## 2023-10-18 NOTE — ED Notes (Signed)
 Lab came to triage to draw pts labs, blood sent at this time

## 2023-10-20 ENCOUNTER — Other Ambulatory Visit: Payer: Self-pay | Admitting: Internal Medicine

## 2023-11-15 ENCOUNTER — Other Ambulatory Visit: Payer: Self-pay | Admitting: Internal Medicine

## 2023-12-05 ENCOUNTER — Encounter: Payer: Self-pay | Admitting: Internal Medicine

## 2023-12-05 ENCOUNTER — Ambulatory Visit (INDEPENDENT_AMBULATORY_CARE_PROVIDER_SITE_OTHER): Payer: BC Managed Care – PPO | Admitting: Internal Medicine

## 2023-12-05 VITALS — BP 124/76 | HR 100 | Ht 69.0 in | Wt 259.4 lb

## 2023-12-05 DIAGNOSIS — R7989 Other specified abnormal findings of blood chemistry: Secondary | ICD-10-CM

## 2023-12-05 DIAGNOSIS — R079 Chest pain, unspecified: Secondary | ICD-10-CM

## 2023-12-05 DIAGNOSIS — E782 Mixed hyperlipidemia: Secondary | ICD-10-CM

## 2023-12-05 DIAGNOSIS — E66811 Obesity, class 1: Secondary | ICD-10-CM

## 2023-12-05 DIAGNOSIS — R635 Abnormal weight gain: Secondary | ICD-10-CM

## 2023-12-05 DIAGNOSIS — I10 Essential (primary) hypertension: Secondary | ICD-10-CM

## 2023-12-05 DIAGNOSIS — F411 Generalized anxiety disorder: Secondary | ICD-10-CM

## 2023-12-05 DIAGNOSIS — R7301 Impaired fasting glucose: Secondary | ICD-10-CM

## 2023-12-05 DIAGNOSIS — E559 Vitamin D deficiency, unspecified: Secondary | ICD-10-CM

## 2023-12-05 NOTE — Progress Notes (Unsigned)
 Subjective:  Patient ID: Ronald Cordova, male    DOB: 02-06-1966  Age: 58 y.o. MRN: [id number]  CC: The primary encounter diagnosis was Essential hypertension. Diagnoses of Obesity (BMI 30.0-34.9), Mixed hyperlipidemia, Obesity, morbid (HCC), Weight gain, Vitamin D  deficiency, Low testosterone  in male, Impaired fasting glucose, Chest pain, unspecified type, and Anxiety state were also pertinent to this visit.   HPI Ronald Cordova presents for  Chief Complaint  Patient presents with   Medical Management of Chronic Issues    6 month follow up     Reviewed recent ER visit for chest pain/anxiety and trouble breathing. . Cardiac enzymes and EKG were normal .  Patient was reassured and discharged.   GAD:  recent stressors have been worse,  not sleeping well, gaining weight  50 lbs over the last 5 years abd obesity .  he has  been walking 2 miles nearly daily with a friend .     Outpatient Medications Prior to Visit  Medication Sig Dispense Refill   celecoxib  (CELEBREX ) 200 MG capsule Take 200 mg by mouth daily.     clonazePAM  (KLONOPIN ) 1 MG tablet TAKE 1.5 TABLETS (1.5 MG TOTAL) BY MOUTH DAILY AS NEEDED FOR ANXIETY 45 tablet 2   cyanocobalamin  (VITAMIN B12) 1000 MCG/ML injection Inject 1 mL intramuscularly once a week for 4 weeks and then once monthly. 12 mL 1   diphenoxylate -atropine  (LOMOTIL ) 2.5-0.025 MG tablet Take 1 tablet by mouth 4 (four) times daily as needed for diarrhea or loose stools. 30 tablet 0   doxazosin  (CARDURA ) 2 MG tablet TAKE 1 TABLET DAILY 90 tablet 1   ezetimibe  (ZETIA ) 10 MG tablet TAKE 1 TABLET DAILY 90 tablet 2   famotidine (PEPCID) 40 MG tablet Take 40 mg by mouth 2 (two) times daily as needed for heartburn or indigestion.     fluticasone  (FLONASE ) 50 MCG/ACT nasal spray Place 2 sprays into both nostrils daily. 16 g 0   fluticasone -salmeterol (WIXELA INHUB) 250-50 MCG/ACT AEPB Inhale 1 puff into the lungs in the morning and at bedtime. 180 each 3   lansoprazole   (PREVACID ) 30 MG capsule TAKE 1 CAPSULE DAILY AT 12 NOON 90 capsule 1   losartan -hydrochlorothiazide  (HYZAAR) 100-12.5 MG tablet TAKE 1 TABLET DAILY 90 tablet 2   NEEDLE, DISP, 25 G 25G X 1" MISC Use to draw up 1 ml of Vitamin b12 16 each 0   nystatin  (MYCOSTATIN ) 100000 UNIT/ML suspension Take 5 mLs (500,000 Units total) by mouth 4 (four) times daily. 60 mL 0   nystatin  (MYCOSTATIN ) 100000 UNIT/ML suspension Take 5 mLs (500,000 Units total) by mouth 2 (two) times daily. AFTER USE OF INHALER 900 mL 3   nystatin  cream (MYCOSTATIN ) APPLY TO AFFECTED AREA TWICE A DAY 30 g 0   ondansetron  (ZOFRAN  ODT) 4 MG disintegrating tablet Take 1 tablet (4 mg total) by mouth every 8 (eight) hours as needed for nausea or vomiting. 20 tablet 0   RETIN-A 0.01 % gel      rosuvastatin  (CRESTOR ) 5 MG tablet TAKE 1 TABLET DAILY 90 tablet 1   scopolamine  (TRANSDERM-SCOP) 1 MG/3DAYS Place 1 patch (1.5 mg total) onto the skin every 3 (three) days. 10 patch 0   sildenafil  (REVATIO ) 20 MG tablet TAKE 3 TO 5 TABLETS BY MOUTH DAILY AS NEEDED 90 tablet 0   Spacer/Aero-Holding Chambers (AEROCHAMBER PLUS) inhaler Use with inhaler 1 each 2   SYRINGE-NEEDLE, DISP, 3 ML (LUER LOCK SAFETY SYRINGES) 25G X 1" 3 ML MISC Use to  give b12 injection. 16 each 0   Syringe/Needle, Disp, (SYRINGE 3CC/18GX1-1/2") 18G X 1-1/2" 3 ML MISC Use to draw up testosterone  10 each 0   Syringe/Needle, Disp, (SYRINGE 3CC/22GX1-1/2") 22G X 1-1/2" 3 ML MISC Use to administer testosterone   every 2 weeks 10 each 5   Syringe/Needle, Disp, (SYRINGE 3CC/25GX1") 25G X 1" 3 ML MISC Use for b12 injections 50 each 0   testosterone  cypionate (DEPOTESTOSTERONE CYPIONATE) 200 MG/ML injection INJECT 1 ML (200 MG TOTAL) INTO THE MUSCLE EVERY 14 DAYS 6 mL 0   tiZANidine  (ZANAFLEX ) 4 MG tablet Take 1 tablet (4 mg total) by mouth every 8 (eight) hours as needed for muscle spasms. 60 tablet 5   traMADol  (ULTRAM ) 50 MG tablet TAKE 1 TABLET (50 MG TOTAL) BY MOUTH EVERY 12  (TWELVE) HOURS AS NEEDED FOR MODERATE PAIN. 60 tablet 5   triamcinolone ointment (KENALOG) 0.1 % Apply 1 application topically 2 (two) times daily.     diphenoxylate -atropine  (LOMOTIL ) 2.5-0.025 MG tablet Take 1 tablet by mouth 4 (four) times daily as needed for diarrhea or loose stools. (Patient not taking: Reported on 09/19/2023) 30 tablet 0   Eluxadoline (VIBERZI) 100 MG TABS Take 2 tablets by mouth daily. (Patient not taking: Reported on 12/05/2023)     Vitamin D , Ergocalciferol , (DRISDOL ) 1.25 MG (50000 UNIT) CAPS capsule TAKE 1 CAPSULE ONCE WEEKLY (Patient not taking: Reported on 12/05/2023) 12 capsule 0   No facility-administered medications prior to visit.    Review of Systems;  Patient denies headache, fevers, malaise, unintentional weight loss, skin rash, eye pain, sinus congestion and sinus pain, sore throat, dysphagia,  hemoptysis , cough, dyspnea, wheezing, chest pain, palpitations, orthopnea, edema, abdominal pain, nausea, melena, diarrhea, constipation, flank pain, dysuria, hematuria, urinary  Frequency, nocturia, numbness, tingling, seizures,  Focal weakness, Loss of consciousness,  Tremor, insomnia, depression, anxiety, and suicidal ideation.      Objective:  BP 124/76   Pulse 100   Ht 5\' 9"  (1.753 m)   Wt 259 lb 6.4 oz (117.7 kg)   SpO2 97%   BMI 38.31 kg/m   BP Readings from Last 3 Encounters:  12/05/23 124/76  10/18/23 112/75  09/19/23 110/70    Wt Readings from Last 3 Encounters:  12/05/23 259 lb 6.4 oz (117.7 kg)  10/17/23 240 lb (108.9 kg)  09/19/23 251 lb 4 oz (114 kg)    Physical Exam Vitals reviewed.  Constitutional:      General: He is not in acute distress.    Appearance: Normal appearance. He is not ill-appearing, toxic-appearing or diaphoretic.  HENT:     Head: Normocephalic.  Eyes:     General: No scleral icterus.       Right eye: No discharge.        Left eye: No discharge.     Conjunctiva/sclera: Conjunctivae normal.  Cardiovascular:      Rate and Rhythm: Normal rate and regular rhythm.     Heart sounds: Normal heart sounds.  Pulmonary:     Effort: Pulmonary effort is normal. No respiratory distress.     Breath sounds: Normal breath sounds.  Musculoskeletal:        General: Normal range of motion.     Cervical back: Normal range of motion.  Skin:    General: Skin is warm and dry.  Neurological:     General: No focal deficit present.     Mental Status: He is alert and oriented to person, place, and time. Mental status is at baseline.  Psychiatric:        Mood and Affect: Mood normal.        Behavior: Behavior normal.        Thought Content: Thought content normal.        Judgment: Judgment normal.   Lab Results  Component Value Date   HGBA1C 5.6 06/06/2023   HGBA1C 5.5 11/29/2022   HGBA1C 5.7 11/16/2021    Lab Results  Component Value Date   CREATININE 0.85 10/18/2023   CREATININE 0.97 06/06/2023   CREATININE 0.90 11/29/2022    Lab Results  Component Value Date   WBC 9.0 10/18/2023   HGB 15.2 10/18/2023   HCT 44.1 10/18/2023   PLT 269 10/18/2023   GLUCOSE 119 (H) 10/18/2023   CHOL 106 06/06/2023   TRIG 70.0 06/06/2023   HDL 37.70 (L) 06/06/2023   LDLDIRECT 59.0 06/06/2023   LDLCALC 54 06/06/2023   ALT 33 06/06/2023   AST 18 06/06/2023   NA 138 10/18/2023   K 3.4 (L) 10/18/2023   CL 104 10/18/2023   CREATININE 0.85 10/18/2023   BUN 10 10/18/2023   CO2 23 10/18/2023   TSH 1.01 11/29/2022   PSA 0.54 06/06/2023   HGBA1C 5.6 06/06/2023   MICROALBUR 5.9 (H) 06/06/2023    DG Chest 2 View Result Date: 10/18/2023 CLINICAL DATA:  Left upper chest pain and shortness of breath EXAM: CHEST - 2 VIEW COMPARISON:  03/26/2022 FINDINGS: The heart size and mediastinal contours are within normal limits. Both lungs are clear. The visualized skeletal structures are unremarkable. IMPRESSION: No active cardiopulmonary disease. Electronically Signed   By: Violeta Grey M.D.   On: 10/18/2023 01:59    Assessment &  Plan:  .Essential hypertension -     Comprehensive metabolic panel with GFR; Future -     Microalbumin / creatinine urine ratio; Future  Obesity (BMI 30.0-34.9)  Mixed hyperlipidemia -     Lipid panel; Future -     LDL cholesterol, direct; Future  Obesity, morbid (HCC) Assessment & Plan: Complicated by CAD .  He has central abdominal obesity.  Encouarged to continue walking for exercise and adherence to a low GI diet  Lab Results  Component Value Date   HGBA1C 5.6 06/06/2023   Lab Results  Component Value Date   TSH 1.01 11/29/2022      Weight gain -     TSH; Future  Vitamin D  deficiency -     VITAMIN D  25 Hydroxy (Vit-D Deficiency, Fractures); Future  Low testosterone  in male -     Testosterone ,Free and Total; Future  Impaired fasting glucose -     Hemoglobin A1c; Future  Chest pain, unspecified type Assessment & Plan: We reviewed his recent noninvasive cardiac workup by Dr Gollan which noted an increase in his CAC  score  to 157  .  He is taking zetia  and crestor    Anxiety state Assessment & Plan: Chronic, aggravated by  work stressors and father's recent diagnosis of end stage liver disease secondary to non alcoholic cirrhosis.  He continues to decline SSRI therapy because of its effect of medication on his erectile function .  Referral to psychiatry discussed today; he is considering it.  Continue current dose of clonazepam        I spent 34 minutes on the day of this face to face encounter reviewing patient's  most recent visit with cardiology,  most recent ER visit ,  relevant surgical and non surgical procedures, recent  labs and imaging studies, counseling  on weight ad anxiety management,  reviewing the assessment and plan with patient, and post visit ordering and reviewing of  diagnostics and therapeutics with patient  .   Follow-up: Return in about 3 months (around 03/05/2024).   Thersia Flax, MD

## 2023-12-05 NOTE — Patient Instructions (Signed)
 I'm glad you are walking daily !     We will check your testosterone    vitamin D  etc when you let us  know about your insurance status  I can refer  you to the [facility] if and when you let me know   Elevated Cortisol levels are not a common cause of weight gain  in the absence of other symptoms

## 2023-12-06 ENCOUNTER — Encounter: Payer: Self-pay | Admitting: Internal Medicine

## 2023-12-06 NOTE — Assessment & Plan Note (Signed)
 Chronic, aggravated by  work stressors and father's recent diagnosis of end stage liver disease secondary to non alcoholic cirrhosis.  He continues to decline SSRI therapy because of its effect of medication on his erectile function .  Referral to psychiatry discussed today; he is considering it.  Continue current dose of clonazepam 

## 2023-12-06 NOTE — Assessment & Plan Note (Signed)
 Complicated by CAD .  He has central abdominal obesity.  Encouarged to continue walking for exercise and adherence to a low GI diet  Lab Results  Component Value Date   HGBA1C 5.6 06/06/2023   Lab Results  Component Value Date   TSH 1.01 11/29/2022

## 2023-12-06 NOTE — Assessment & Plan Note (Signed)
 We reviewed his recent noninvasive cardiac workup by Dr Gollan which noted an increase in his CAC  score  to 157  .  He is taking zetia  and crestor 

## 2024-01-23 ENCOUNTER — Other Ambulatory Visit (INDEPENDENT_AMBULATORY_CARE_PROVIDER_SITE_OTHER)

## 2024-01-23 ENCOUNTER — Other Ambulatory Visit: Payer: Self-pay | Admitting: Internal Medicine

## 2024-01-23 ENCOUNTER — Telehealth: Payer: Self-pay

## 2024-01-23 DIAGNOSIS — R7989 Other specified abnormal findings of blood chemistry: Secondary | ICD-10-CM

## 2024-01-23 DIAGNOSIS — I10 Essential (primary) hypertension: Secondary | ICD-10-CM

## 2024-01-23 DIAGNOSIS — E782 Mixed hyperlipidemia: Secondary | ICD-10-CM

## 2024-01-23 DIAGNOSIS — E559 Vitamin D deficiency, unspecified: Secondary | ICD-10-CM

## 2024-01-23 DIAGNOSIS — R7301 Impaired fasting glucose: Secondary | ICD-10-CM

## 2024-01-23 DIAGNOSIS — R635 Abnormal weight gain: Secondary | ICD-10-CM

## 2024-01-23 NOTE — Addendum Note (Signed)
 Addended by: Thressa Flora D on: 01/23/2024 10:39 AM   Modules accepted: Orders

## 2024-01-23 NOTE — Telephone Encounter (Signed)
 Patient states someone told him to please talk with Marven Slimmer regarding his bill.  I offered to let him speak with our billing department but patient said he would like to speak with Sherian Dimitri first.

## 2024-01-23 NOTE — Addendum Note (Signed)
 Addended by: Thressa Flora D on: 01/23/2024 10:20 AM   Modules accepted: Orders

## 2024-01-24 ENCOUNTER — Ambulatory Visit: Payer: Self-pay | Admitting: Internal Medicine

## 2024-01-24 LAB — COMPREHENSIVE METABOLIC PANEL WITH GFR
ALT: 33 IU/L (ref 0–44)
AST: 22 IU/L (ref 0–40)
Albumin: 4.4 g/dL (ref 3.8–4.9)
Alkaline Phosphatase: 79 IU/L (ref 44–121)
BUN/Creatinine Ratio: 8 — ABNORMAL LOW (ref 9–20)
BUN: 8 mg/dL (ref 6–24)
Bilirubin Total: 0.4 mg/dL (ref 0.0–1.2)
CO2: 21 mmol/L (ref 20–29)
Calcium: 9.6 mg/dL (ref 8.7–10.2)
Chloride: 104 mmol/L (ref 96–106)
Creatinine, Ser: 0.98 mg/dL (ref 0.76–1.27)
Globulin, Total: 2.5 g/dL (ref 1.5–4.5)
Glucose: 92 mg/dL (ref 70–99)
Potassium: 4 mmol/L (ref 3.5–5.2)
Sodium: 140 mmol/L (ref 134–144)
Total Protein: 6.9 g/dL (ref 6.0–8.5)
eGFR: 90 mL/min/{1.73_m2} (ref >59)

## 2024-01-24 LAB — TESTOSTERONE,FREE AND TOTAL
Testosterone, Free: 5 pg/mL — ABNORMAL LOW (ref 7.2–24.0)
Testosterone: 241 ng/dL — ABNORMAL LOW (ref 264–916)

## 2024-01-24 LAB — TSH: TSH: 1.66 u[IU]/mL (ref 0.450–4.500)

## 2024-01-24 LAB — LIPID PANEL
Chol/HDL Ratio: 3.7 ratio (ref 0.0–5.0)
Cholesterol, Total: 114 mg/dL (ref 100–199)
HDL: 31 mg/dL — ABNORMAL LOW (ref >39)
LDL Chol Calc (NIH): 56 mg/dL (ref 0–99)
Triglycerides: 157 mg/dL — ABNORMAL HIGH (ref 0–149)
VLDL Cholesterol Cal: 27 mg/dL (ref 5–40)

## 2024-01-24 LAB — MICROALBUMIN / CREATININE URINE RATIO
Creatinine, Urine: 291.1 mg/dL
Microalb/Creat Ratio: 6 mg/g{creat} (ref 0–29)
Microalbumin, Urine: 17 ug/mL

## 2024-01-24 LAB — HEMOGLOBIN A1C
Est. average glucose Bld gHb Est-mCnc: 114 mg/dL
Hgb A1c MFr Bld: 5.6 % (ref 4.8–5.6)

## 2024-01-24 LAB — VITAMIN D 25 HYDROXY (VIT D DEFICIENCY, FRACTURES): Vit D, 25-Hydroxy: 19.6 ng/mL — ABNORMAL LOW (ref 30.0–100.0)

## 2024-01-24 LAB — LDL CHOLESTEROL, DIRECT: LDL Direct: 61 mg/dL (ref 0–99)

## 2024-01-25 ENCOUNTER — Other Ambulatory Visit: Payer: Self-pay | Admitting: Internal Medicine

## 2024-01-25 MED ORDER — TESTOSTERONE CYPIONATE 200 MG/ML IM SOLN: 100.0000 mg | INTRAMUSCULAR | 2 refills | Status: AC

## 2024-01-25 MED ORDER — ERGOCALCIFEROL 1.25 MG (50000 UT) PO CAPS: 50000.0000 [IU] | ORAL_CAPSULE | ORAL | 0 refills | Status: DC

## 2024-01-25 MED ORDER — TRAMADOL HCL 50 MG PO TABS: 50.0000 mg | ORAL_TABLET | Freq: Two times a day (BID) | ORAL | 5 refills | Status: DC | PRN

## 2024-02-02 ENCOUNTER — Other Ambulatory Visit: Payer: Self-pay | Admitting: Internal Medicine

## 2024-02-03 ENCOUNTER — Other Ambulatory Visit: Payer: Self-pay | Admitting: Internal Medicine

## 2024-03-11 ENCOUNTER — Ambulatory Visit
Admission: EM | Admit: 2024-03-11 | Discharge: 2024-03-11 | Disposition: A | Discharge status: Home / Self Care | Attending: Nurse Practitioner | Admitting: Nurse Practitioner

## 2024-03-11 DIAGNOSIS — J0110 Acute frontal sinusitis, unspecified: Secondary | ICD-10-CM

## 2024-03-11 DIAGNOSIS — J069 Acute upper respiratory infection, unspecified: Secondary | ICD-10-CM

## 2024-03-11 DIAGNOSIS — R0982 Postnasal drip: Secondary | ICD-10-CM

## 2024-03-11 MED ORDER — PREDNISONE 20 MG PO TABS
40.0000 mg | ORAL_TABLET | Freq: Every day | ORAL | 0 refills | Status: DC
Start: 2024-03-11 — End: 2024-03-16

## 2024-03-11 MED ORDER — AZITHROMYCIN 500 MG PO TABS: 500.0000 mg | ORAL_TABLET | Freq: Every day | ORAL | 0 refills | Status: AC

## 2024-03-11 MED ORDER — FLUTICASONE PROPIONATE 50 MCG/ACT NA SUSP: 2.0000 | Freq: Every day | NASAL | 0 refills | Status: AC

## 2024-03-11 MED ORDER — ALBUTEROL SULFATE (2.5 MG/3ML) 0.083% IN NEBU
2.5000 mg | INHALATION_SOLUTION | Freq: Once | RESPIRATORY_TRACT | Status: AC
  Administered 2024-03-11: 2.5 mg via RESPIRATORY_TRACT

## 2024-03-11 MED ORDER — CETIRIZINE-PSEUDOEPHEDRINE ER 5-120 MG PO TB12: 1.0000 | ORAL_TABLET | Freq: Every day | ORAL | 0 refills | Status: AC

## 2024-03-11 NOTE — Discharge Instructions (Addendum)
 You were seen today for symptoms consistent with an upper respiratory infection and sinusitis. On exam, your lungs were clear, but you have fluid behind your right eardrum and swollen, tender lymph nodes in your neck. A nebulizer treatment was given in clinic to help open your airways and loosen mucus. You were prescribed Zithromax  and prednisone  for five days, along with Zyrtec -D and Flonase  to help manage your congestion and allergy symptoms. Continue taking Tramadol  in the morning and at night, and Tylenol  every 4-6 hours as needed for pain or fever. Use your nystatin  liquid as directed for thrush, and remember to rinse your mouth after using your inhaler to help prevent it from coming back. To support your recovery, use a humidifier at night and keep your head elevated to reduce postnasal drainage. Change your toothbrush after completing the antibiotic. Watch for signs of worsening illness such as high fever, facial swelling, persistent headache, ear pain, difficulty breathing, or chest discomfort. If you develop these symptoms, contact your primary care provider or seek emergency care. Follow up with your PCP if you do not start to improve after completing the prescribed regimen.

## 2024-03-11 NOTE — ED Triage Notes (Signed)
 Patient to Urgent Care with complaints of cough/ sore throat/ facial and sinus pain/ headaches/ green nasal congestion/ left sided ear fullness.  Symptoms x6 days. Negative covid and flu home test.   Meds: otc cold and flu meds/ cough drops

## 2024-03-11 NOTE — ED Provider Notes (Signed)
 [name]    CSN: [id number] Arrival date & time: 03/11/24  0801      History   Chief Complaint Chief Complaint  Patient presents with   Facial Pain   Cough   Sore Throat    HPI Ronald Cordova is a 58 y.o. male.   Discussed the use of AI scribe software for clinical note transcription with the patient, who gave verbal consent to proceed.   The patient presents with multiple symptoms including nausea, sinus congestion, ear pain, and headache that began on Wednesday. The patient reports experiencing thick nasal drainage and congestion, describing his left ear as totally slammed shut with associated pain. He also complains of headache localized to the forehead and sinuses, along with significant pressure in these areas. The patient describes waking up choking due to the drainage. This morning, he noted brown and bloody sputum. Last night, he experienced a fever of 100.58F with associated chills. The patient reports coughing due to the drainage, which is causing shortness of breath and chest tightness. He denies wheezing but mention difficulty breathing and getting out of breath with the cough. The patient has taken multiple COVID-19 and influenza tests, all of which have been negative. The most recent test was performed last night. He consulted with [facility] on Wednesday, who advised getting a COVID test first and suggested Paxlovid  if positive. The patient has been managing symptoms with Tylenol  every 4-6 hours and tramadol  in the morning and at night. He mentions using an inhaler for allergies in his throat and nystatin  liquid to prevent thrush.  The patient works as an Regulatory affairs officer, doing team runs between various states including [place] , [place] , [place] , [place] , [place] , and [place] . He notes that his legs have been hurting this week, which he describes as typical for his work.  The following portions of the patient's history were reviewed and  updated as appropriate: allergies, current medications, past family history, past medical history, past social history, past surgical history, and problem list.    Past Medical History:  Diagnosis Date   Anxiety state, unspecified    Arthritis    Chest pain with moderate risk for cardiac etiology 01/21/2012   Long history of tobacco use, now quit.   CT ordered   Community acquired pneumonia 05/18/2021   COVID-19 10/06/2022   Dental crowns present    Eosinophilic esophagitis 2010   by EGD with  biopsies   GERD (gastroesophageal reflux disease)    carafate added by Dr. 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   Diarrhea of presumed infectious origin 06/06/2023   Pain and swelling of left lower leg 03/28/2023   Motion sickness 03/28/2023   Hiccup 11/10/2022   Grief reaction with prolonged bereavement 08/23/2022   Low testosterone  in male 02/13/2022   Fatigue 11/16/2021   Screen for STD (sexually transmitted disease) 04/19/2021   Hematuria, unspecified 04/19/2021   Glucosuria with normal serum glucose 03/17/2021  Edema 12/17/2019   Colon cancer screening 08/29/2018   B12 deficiency 08/29/2018   Allergic rhinitis 04/25/2018   Aortic atherosclerosis (HCC) 11/21/2017   Chronic right-sided low back pain without sciatica 09/08/2017   Fatty liver 03/15/2017   Vitamin D  deficiency 12/23/2016   Restless legs 12/21/2016   Ulnar neuropathy at elbow of left upper extremity 06/29/2016   Family  history of sudden death in brother 12-05-2015   Polyarthralgia December 05, 2015   Chronic bilateral thoracic back pain 07/29/2015   Palpitations 07/29/2015   Essential hypertension 11/13/2014   Major depressive disorder, recurrent episode, 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HCC) 11/03/2014   Pharyngitis 04/23/2014   Benign localized hyperplasia of prostate with urinary obstruction 02/19/2013   Hypogonadism male 02/19/2013   Reduced libido 02/19/2013   Incidental pulmonary nodule, > 3mm and < 8mm 02/21/2012   Tobacco abuse, 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   Isolated or specific phobia 01/21/2012   Chest pain 01/21/2012   Mixed hyperlipidemia 05/09/2011   Anxiety state    Eosinophilic esophagitis    Irritable bowel syndrome    Internal hemorrhoids     Past Surgical History:  Procedure Laterality Date   APPENDECTOMY     done during high school   carpal tunnel release  Sept 2008   R hand, by  [place], [place]    ETHMOIDECTOMY Bilateral 03/16/2022   Procedure: TOTAL ETHMOIDECTOMY-LEFT REVISION ETHMOIDECTOMY-RIGHT;  Surgeon: Blair Mt, MD;  Location: [facility];  Service: ENT;  Laterality: Bilateral;   FRONTAL SINUS EXPLORATION Left 03/16/2022   Procedure: FRONTAL SINUS EXPLORATION;  Surgeon: Blair Mt, MD;  Location: [facility];  Service: ENT;  Laterality: Left;   IMAGE GUIDED SINUS SURGERY N/A 03/16/2022   Procedure: IMAGE GUIDED SINUS SURGERY;  Surgeon: Blair Mt, MD;  Location: [facility];  Service: ENT;  Laterality: N/A;  STRYKER DISK ON OR CHARGE NURSE DESK 7-7 KP   JOINT REPLACEMENT     KNEE ARTHROSCOPY  2000   left   MAXILLARY ANTROSTOMY Left 03/16/2022   Procedure: MAXILLARY ANTROSTOMY WITH TISSUE REMOVAL;  Surgeon: Blair Mt, MD;  Location: [facility];  Service: ENT;  Laterality: Left;   NASAL SINUS SURGERY  1998   bilateral   SPHENOIDECTOMY Bilateral 03/16/2022   Procedure: SPHENOIDECTOMY WITH TISSUE REMOVAL;  Surgeon: Blair Mt, MD;  Location:  [facility];  Service: ENT;  Laterality: Bilateral;       Home Medications    Prior to Admission medications   Medication Sig Start Date End Date Taking? Authorizing Provider  azithromycin  (ZITHROMAX ) 500 MG tablet Take 1 tablet (500 mg total) by mouth daily for 5 days. 03/11/24 03/16/24 Yes Iola Lukes, FNP  cetirizine -pseudoephedrine  (ZYRTEC -D) 5-120 MG tablet Take 1 tablet by mouth daily with breakfast for 10 days. 03/11/24 03/21/24 Yes Iola Lukes, FNP  fluticasone  (FLONASE ) 50 MCG/ACT nasal spray Place 2 sprays into both nostrils daily. Shake well before use. Gently blow nose before spraying. Do not blow nose immediately after use. You should not taste the medication or feel it going down your throat; if you do, adjust your technique. 03/11/24  Yes Nayah Lukens, FNP  predniSONE  (DELTASONE ) 20 MG tablet Take 2 tablets (40 mg total) by mouth daily for 5 days. 03/11/24 03/16/24 Yes Taras Rask, FNP  celecoxib  (CELEBREX ) 200 MG capsule Take 200 mg by mouth daily. 11/25/22   [provider]  clonazePAM  (KLONOPIN ) 1 MG tablet TAKE 1.5 TABLETS (1.5 MG TOTAL) BY MOUTH DAILY AS NEEDED FOR ANXIETY 11/15/23   Marylynn,  Verneita CROME, MD  cyanocobalamin  (VITAMIN B12) 1000 MCG/ML injection Inject 1 mL intramuscularly once a week for 4 weeks and then once monthly. 09/12/23   Marylynn Verneita CROME, MD  diphenoxylate -atropine  (LOMOTIL ) 2.5-0.025 MG tablet Take 1 tablet by mouth 4 (four) times daily as needed for diarrhea or loose stools. 08/23/22   Marylynn Verneita CROME, MD  doxazosin  (CARDURA ) 2 MG tablet TAKE 1 TABLET DAILY 10/17/23   Marylynn Verneita CROME, MD  ergocalciferol  (VITAMIN D2) 1.25 MG (50000 UT) capsule Take 1 capsule (50,000 Units total) by mouth once a week. 01/25/24   Marylynn Verneita CROME, MD  ezetimibe  (ZETIA ) 10 MG tablet TAKE 1 TABLET DAILY 02/03/24   Tullo, Teresa L, MD  famotidine (PEPCID) 40 MG tablet Take 40 mg by mouth 2 (two) times daily as needed for heartburn or indigestion.    [provider]  fluticasone -salmeterol (WIXELA INHUB) 250-50 MCG/ACT AEPB Inhale 1 puff into the lungs in the morning and at bedtime. 07/11/23   Marylynn Verneita CROME, MD  lansoprazole  (PREVACID ) 30 MG capsule TAKE 1 CAPSULE DAILY AT 12 NOON 10/20/23   Marylynn Verneita CROME, MD  losartan -hydrochlorothiazide  (HYZAAR) 100-12.5 MG tablet TAKE 1 TABLET DAILY 02/05/24   Tullo, Teresa L, MD  NEEDLE, DISP, 25 G 25G X 1 MISC Use to draw up 1 ml of Vitamin b12 09/12/23   Tullo, Teresa L, MD  nystatin  (MYCOSTATIN ) 100000 UNIT/ML suspension Take 5 mLs (500,000 Units total) by mouth 4 (four) times daily. 06/06/23   Marylynn Verneita CROME, MD  nystatin  (MYCOSTATIN ) 100000 UNIT/ML suspension Take 5 mLs (500,000 Units total) by mouth 2 (two) times daily. AFTER USE OF INHALER 07/11/23   Marylynn Verneita CROME, MD  nystatin  cream (MYCOSTATIN ) APPLY TO AFFECTED AREA TWICE A DAY 01/27/23   Webb, Padonda B, FNP  ondansetron  (ZOFRAN  ODT) 4 MG disintegrating tablet Take 1 tablet (4 mg total) by mouth every 8 (eight) hours as needed for nausea or vomiting. 05/05/21   Corlis Burnard DEL, NP  RETIN-A 0.01 % gel  06/25/19   [provider]  rosuvastatin  (CRESTOR ) 5 MG tablet TAKE 1 TABLET DAILY 10/05/23   Marylynn Verneita CROME, MD  scopolamine  (TRANSDERM-SCOP) 1 MG/3DAYS Place 1 patch (1.5 mg total) onto the skin every 3 (three) days. 03/28/23   Maribeth Camellia MATSU, MD  sildenafil  (REVATIO ) 20 MG tablet TAKE 3 TO 5 TABLETS BY MOUTH DAILY AS NEEDED 06/14/23   Marylynn Verneita CROME, MD  Spacer/Aero-Holding Chambers (AEROCHAMBER PLUS) inhaler Use with inhaler 07/29/21   Van Knee, MD  SYRINGE-NEEDLE, DISP, 3 ML (LUER LOCK SAFETY SYRINGES) 25G X 1 3 ML MISC Use to give b12 injection. 01/27/23   Marylynn Verneita CROME, MD  Syringe/Needle, Disp, (SYRINGE 3CC/18GX1-1/2) 18G X 1-1/2 3 ML MISC Use to draw up testosterone  02/15/22   Marylynn Verneita CROME, MD  Syringe/Needle, Disp, (SYRINGE 3CC/22GX1-1/2) 22G X 1-1/2 3 ML MISC Use to administer testosterone   every 2 weeks 02/15/22    Marylynn Verneita CROME, MD  Syringe/Needle, Disp, (SYRINGE 3CC/25GX1) 25G X 1 3 ML MISC Use for b12 injections 09/12/23   Marylynn Verneita CROME, MD  testosterone  cypionate (DEPOTESTOSTERONE CYPIONATE) 200 MG/ML injection Inject 0.5 mLs (100 mg total) into the skin every 14 (fourteen) days. 01/25/24   Marylynn Verneita CROME, MD  tiZANidine  (ZANAFLEX ) 4 MG tablet Take 1 tablet (4 mg total) by mouth every 8 (eight) hours as needed for muscle spasms. 06/23/20   Marylynn Verneita CROME, MD  traMADol  (ULTRAM ) 50 MG tablet Take 1 tablet (  50 mg total) by mouth every 12 (twelve) hours as needed for moderate pain (pain score 4-6). 01/25/24   Tullo, Teresa L, MD  triamcinolone ointment (KENALOG) 0.1 % Apply 1 application topically 2 (two) times daily. 05/06/20   [provider]    Family History Family History  Problem Relation Age of Onset   Hyperlipidemia Mother    Hypertension Mother    Hyperlipidemia Father    Hypertension Father    Hyperlipidemia Sister    Hyperlipidemia Brother    Heart attack Brother 5   BRCA 1/2 Maternal Aunt     Social History Social History   Tobacco Use   Smoking status: Former    Current packs/day: 0.00    Average packs/day: 1 pack/day for 15.0 years (15.0 ttl pk-yrs)    Types: Cigarettes    Start date: 05/16/1996    Quit date: 05/17/2011    Years since quitting: 12.8   Smokeless tobacco: Never  Vaping Use   Vaping status: Never Used  Substance Use Topics   Alcohol use: Yes    Comment: occasional   Drug use: No     Allergies   Clindamycin/lincomycin, Penicillin g, and Penicillins   Review of Systems Review of Systems  Constitutional:  Positive for chills and fever. Negative for diaphoresis and fatigue.  HENT:  Positive for congestion, ear pain, postnasal drip, rhinorrhea and sinus pressure. Negative for nosebleeds, sneezing, sore throat and trouble swallowing.   Respiratory:  Positive for cough, choking and shortness of breath. Negative for wheezing.   Cardiovascular:   Negative for leg swelling.  Gastrointestinal:  Positive for nausea. Negative for diarrhea and vomiting.  Musculoskeletal:  Negative for myalgias.  Neurological:  Positive for headaches. Negative for dizziness.  All other systems reviewed and are negative.    Physical Exam Triage Vital Signs ED Triage Vitals  Encounter Vitals Group     BP 03/11/24 0811 137/75     Girls Systolic BP Percentile --      Girls Diastolic BP Percentile --      Boys Systolic BP Percentile --      Boys Diastolic BP Percentile --      Pulse Rate 03/11/24 0811 99     Resp 03/11/24 0811 18     Temp 03/11/24 0811 98 F (36.7 C)     Temp src --      SpO2 03/11/24 0811 98 %     Weight --      Height --      Head Circumference --      Peak Flow --      Pain Score 03/11/24 0807 5     Pain Loc --      Pain Education --      Exclude from Growth Chart --    No data found.  Updated Vital Signs BP 137/75   Pulse 99   Temp 98 F (36.7 C)   Resp 18   SpO2 98%   Visual Acuity Right Eye Distance:   Left Eye Distance:   Bilateral Distance:    Right Eye Near:   Left Eye Near:    Bilateral Near:     Physical Exam Vitals reviewed.  Constitutional:      General: He is awake. He is not in acute distress.    Appearance: Normal appearance. He is well-developed and well-groomed. He is not ill-appearing, toxic-appearing or diaphoretic.  HENT:     Head: Normocephalic.     Right Ear: Hearing and  external ear normal. Swelling and tenderness present. No drainage. A middle ear effusion is present. Tympanic membrane is not erythematous.     Left Ear: Hearing, tympanic membrane, ear canal and external ear normal. No drainage, swelling or tenderness.  No middle ear effusion. Tympanic membrane is not erythematous.     Nose: Congestion present. No rhinorrhea.     Right Sinus: Frontal sinus tenderness present. No maxillary sinus tenderness.     Left Sinus: Frontal sinus tenderness present. No maxillary sinus  tenderness.     Mouth/Throat:     Lips: Pink.     Mouth: Mucous membranes are moist.     Pharynx: Oropharynx is clear. Uvula midline. Posterior oropharyngeal erythema present. No pharyngeal swelling, oropharyngeal exudate or uvula swelling.     Tonsils: No tonsillar exudate or tonsillar abscesses.  Eyes:     General: Vision grossly intact.     Conjunctiva/sclera: Conjunctivae normal.  Cardiovascular:     Rate and Rhythm: Normal rate and regular rhythm.     Heart sounds: Normal heart sounds.  Pulmonary:     Effort: Pulmonary effort is normal. No tachypnea or respiratory distress.     Breath sounds: Normal breath sounds and air entry. No decreased air movement. No decreased breath sounds, wheezing or rhonchi.  Musculoskeletal:        General: Normal range of motion.     Cervical back: Full passive range of motion without pain, normal range of motion and neck supple.  Lymphadenopathy:     Cervical: Cervical adenopathy present.  Skin:    General: Skin is warm and dry.  Neurological:     General: No focal deficit present.     Mental Status: He is alert and oriented to person, place, and time.     Motor: Motor function is intact.     Coordination: Coordination is intact.     Gait: Gait is intact.  Psychiatric:        Mood and Affect: Mood normal.        Speech: Speech normal.        Behavior: Behavior normal. Behavior is cooperative.      UC Treatments / Results  Labs (all labs ordered are listed, but only abnormal results are displayed) Labs Reviewed - No data to display  EKG   Radiology No results found.  Procedures Procedures (including critical care time)  Medications Ordered in UC Medications  albuterol  (PROVENTIL ) (2.5 MG/3ML) 0.083% nebulizer solution 2.5 mg (2.5 mg Nebulization Given 03/11/24 0835)    Initial Impression / Assessment and Plan / UC Course  I have reviewed the triage vital signs and the nursing notes.  Pertinent labs & imaging results that were  available during my care of the patient were reviewed by me and considered in my medical decision making (see chart for details).     Patient presents with symptoms consistent with an upper respiratory tract infection and acute sinusitis, ongoing for the past 4 days. Examination reveals clear lung sounds, right middle ear effusion, and tender, enlarged cervical lymph nodes. Nebulizer treatment was administered in clinic to improve sinus drainage and loosen mucus. The patient was prescribed a 5-day course of Zithromax  and prednisone , along with Zyrtec -D and Flonase  for ongoing allergy and sinus management. Supportive care measures, including use of a humidifier and head-of-bed elevation, were advised to reduce postnasal drip. The patient was instructed to change her toothbrush after completing antibiotics to prevent reinfection. Current medications, including Tramadol  and Tylenol  for pain and fever,  will be continued. Patient was counseled on using nystatin  liquid for thrush and reminded to rinse mouth after inhaler use to prevent recurrence. Proper inhaler technique for allergy management was reviewed. Patient to follow up with PCP if symptoms worsen or do not improve within the expected timeframe.  Today's evaluation has revealed no signs of a dangerous process. Discussed diagnosis with patient and/or guardian. Patient and/or guardian aware of their diagnosis, possible red flag symptoms to watch out for and need for close follow up. Patient and/or guardian understands verbal and written discharge instructions. Patient and/or guardian comfortable with plan and disposition.  Patient and/or guardian has a clear mental status at this time, good insight into illness (after discussion and teaching) and has clear judgment to make decisions regarding their care  Documentation was completed with the aid of voice recognition software. Transcription may contain typographical errors. Final Clinical Impressions(s) / UC  Diagnoses   Final diagnoses:  URI with cough and congestion  Acute non-recurrent frontal sinusitis  Post-nasal drainage     Discharge Instructions      You were seen today for symptoms consistent with an upper respiratory infection and sinusitis. On exam, your lungs were clear, but you have fluid behind your right eardrum and swollen, tender lymph nodes in your neck. A nebulizer treatment was given in clinic to help open your airways and loosen mucus. You were prescribed Zithromax  and prednisone  for five days, along with Zyrtec -D and Flonase  to help manage your congestion and allergy symptoms. Continue taking Tramadol  in the morning and at night, and Tylenol  every 4-6 hours as needed for pain or fever. Use your nystatin  liquid as directed for thrush, and remember to rinse your mouth after using your inhaler to help prevent it from coming back. To support your recovery, use a humidifier at night and keep your head elevated to reduce postnasal drainage. Change your toothbrush after completing the antibiotic. Watch for signs of worsening illness such as high fever, facial swelling, persistent headache, ear pain, difficulty breathing, or chest discomfort. If you develop these symptoms, contact your primary care provider or seek emergency care. Follow up with your PCP if you do not start to improve after completing the prescribed regimen.       ED Prescriptions     Medication Sig Dispense Auth. Provider   azithromycin  (ZITHROMAX ) 500 MG tablet Take 1 tablet (500 mg total) by mouth daily for 5 days. 5 tablet Iola Lukes, FNP   cetirizine -pseudoephedrine  (ZYRTEC -D) 5-120 MG tablet Take 1 tablet by mouth daily with breakfast for 10 days. 10 tablet Iola Lukes, FNP   fluticasone  (FLONASE ) 50 MCG/ACT nasal spray Place 2 sprays into both nostrils daily. Shake well before use. Gently blow nose before spraying. Do not blow nose immediately after use. You should not taste the medication or feel  it going down your throat; if you do, adjust your technique. 16 g Iola Lukes, FNP   predniSONE  (DELTASONE ) 20 MG tablet Take 2 tablets (40 mg total) by mouth daily for 5 days. 10 tablet Iola Lukes, FNP      PDMP not reviewed this encounter.   Iola Atkinson, [place] 03/11/24 [phone]

## 2024-03-12 ENCOUNTER — Ambulatory Visit: Admitting: Internal Medicine

## 2024-03-12 ENCOUNTER — Encounter: Payer: Self-pay | Admitting: Internal Medicine

## 2024-03-12 VITALS — BP 140/76 | HR 105 | Ht 69.0 in | Wt 246.0 lb

## 2024-03-12 DIAGNOSIS — J0110 Acute frontal sinusitis, unspecified: Secondary | ICD-10-CM | POA: Diagnosis not present

## 2024-03-12 MED ORDER — LANSOPRAZOLE 30 MG PO CPDR: DELAYED_RELEASE_CAPSULE | ORAL | 1 refills | Status: DC

## 2024-03-12 MED ORDER — EZETIMIBE 10 MG PO TABS: 10.0000 mg | ORAL_TABLET | Freq: Every day | ORAL | 1 refills | Status: DC

## 2024-03-12 MED ORDER — LEVOFLOXACIN 500 MG PO TABS: 500.0000 mg | ORAL_TABLET | Freq: Every day | ORAL | 0 refills | Status: AC

## 2024-03-12 MED ORDER — DOXAZOSIN MESYLATE 2 MG PO TABS: 2.0000 mg | ORAL_TABLET | Freq: Every day | ORAL | 1 refills | Status: DC

## 2024-03-12 MED ORDER — ONDANSETRON 4 MG PO TBDP: 4.0000 mg | ORAL_TABLET | Freq: Three times a day (TID) | ORAL | 2 refills | Status: AC | PRN

## 2024-03-12 MED ORDER — HYDROCOD POLI-CHLORPHE POLI ER 10-8 MG/5ML PO SUER: 5.0000 mL | Freq: Two times a day (BID) | ORAL | 0 refills | Status: AC | PRN

## 2024-03-12 MED ORDER — PREDNISONE 10 MG PO TABS: ORAL_TABLET | ORAL | 0 refills | Status: AC

## 2024-03-12 MED ORDER — ROSUVASTATIN CALCIUM 5 MG PO TABS: 5.0000 mg | ORAL_TABLET | Freq: Every day | ORAL | 1 refills | Status: AC

## 2024-03-12 NOTE — Assessment & Plan Note (Signed)
 With otitis suggested by exam.  Changing azithromycin  to levaquin  and increasing the prednisone  dose as well.  Work note written

## 2024-03-12 NOTE — Patient Instructions (Addendum)
 I am changing your antibiotic to levaquin  and adding a prolonged prednisone  taper, because you have sinusitis/otitis  Use generic afrin nasal spray every 12 hours for 5 days for the congestion.    IF YOU USE THE TUSSIONEX COUGH SYRUP AT NIGHT,  YOU MUST:  REDUCE THE NIGHTTIME DOSE OF CLONAZEPAM    SUSPEND THE EVENING DOSE OF TRAMADOL   To avoid oversedation   Daily use of a probiotic advised for 3 weeks.

## 2024-03-12 NOTE — Progress Notes (Signed)
 Subjective:  Patient ID: Ronald Cordova, male    DOB: Jul 07, 1966  Age: 58 y.o. MRN: [id number]  CC: The encounter diagnosis was Acute non-recurrent frontal sinusitis.   HPI Ronald Cordova presents for  Chief Complaint  Patient presents with   Medical Management of Chronic Issues    3 month follow up   Attended a children's birthday party last week and developed laryngitis, pharyngitis 48 hours later along with tender cervical Lymphadenopathy,  and fever (100.4) .   HOME COVID TEST Was negative .  He was prescribed azithromycin  x 5 and prednisone  40 mg daily x 5  which he started yesterday Has continued to work but has developed decreased hearing ,  fullness and pressure . He has had an uncontrolled cough at night that results in shortness of breath.  He ha shad brownish purulent sinus drainage     Outpatient Medications Prior to Visit  Medication Sig Dispense Refill   azithromycin  (ZITHROMAX ) 500 MG tablet Take 1 tablet (500 mg total) by mouth daily for 5 days. 5 tablet 0   benzonatate  (TESSALON ) 200 MG capsule Take 200 mg by mouth 3 (three) times daily as needed.     celecoxib  (CELEBREX ) 200 MG capsule Take 200 mg by mouth daily.     cetirizine -pseudoephedrine  (ZYRTEC -D) 5-120 MG tablet Take 1 tablet by mouth daily with breakfast for 10 days. 10 tablet 0   clonazePAM  (KLONOPIN ) 1 MG tablet TAKE 1.5 TABLETS (1.5 MG TOTAL) BY MOUTH DAILY AS NEEDED FOR ANXIETY 45 tablet 2   cyanocobalamin  (VITAMIN B12) 1000 MCG/ML injection Inject 1 mL intramuscularly once a week for 4 weeks and then once monthly. 12 mL 1   diphenoxylate -atropine  (LOMOTIL ) 2.5-0.025 MG tablet Take 1 tablet by mouth 4 (four) times daily as needed for diarrhea or loose stools. 30 tablet 0   ergocalciferol  (VITAMIN D2) 1.25 MG (50000 UT) capsule Take 1 capsule (50,000 Units total) by mouth once a week. 12 capsule 0   famotidine (PEPCID) 40 MG tablet Take 40 mg by mouth 2 (two) times daily as needed for heartburn or  indigestion.     fluticasone  (FLONASE ) 50 MCG/ACT nasal spray Place 2 sprays into both nostrils daily. Shake well before use. Gently blow nose before spraying. Do not blow nose immediately after use. You should not taste the medication or feel it going down your throat; if you do, adjust your technique. 16 g 0   fluticasone -salmeterol (WIXELA INHUB) 250-50 MCG/ACT AEPB Inhale 1 puff into the lungs in the morning and at bedtime. 180 each 3   losartan -hydrochlorothiazide  (HYZAAR) 100-12.5 MG tablet TAKE 1 TABLET DAILY 90 tablet 2   NEEDLE, DISP, 25 G 25G X 1 MISC Use to draw up 1 ml of Vitamin b12 16 each 0   nystatin  (MYCOSTATIN ) 100000 UNIT/ML suspension Take 5 mLs (500,000 Units total) by mouth 4 (four) times daily. 60 mL 0   nystatin  (MYCOSTATIN ) 100000 UNIT/ML suspension Take 5 mLs (500,000 Units total) by mouth 2 (two) times daily. AFTER USE OF INHALER 900 mL 3   nystatin  cream (MYCOSTATIN ) APPLY TO AFFECTED AREA TWICE A DAY 30 g 0   RETIN-A 0.01 % gel      scopolamine  (TRANSDERM-SCOP) 1 MG/3DAYS Place 1 patch (1.5 mg total) onto the skin every 3 (three) days. 10 patch 0   sildenafil  (REVATIO ) 20 MG tablet TAKE 3 TO 5 TABLETS BY MOUTH DAILY AS NEEDED 90 tablet 0   Spacer/Aero-Holding Chambers (AEROCHAMBER PLUS) inhaler Use with inhaler  1 each 2   Syringe/Needle, Disp, (SYRINGE 3CC/22GX1-1/2) 22G X 1-1/2 3 ML MISC Use to administer testosterone   every 2 weeks 10 each 5   Syringe/Needle, Disp, (SYRINGE 3CC/25GX1) 25G X 1 3 ML MISC Use for b12 injections 50 each 0   testosterone  cypionate (DEPOTESTOSTERONE CYPIONATE) 200 MG/ML injection Inject 0.5 mLs (100 mg total) into the skin every 14 (fourteen) days. 6 mL 2   tiZANidine  (ZANAFLEX ) 4 MG tablet Take 1 tablet (4 mg total) by mouth every 8 (eight) hours as needed for muscle spasms. 60 tablet 5   traMADol  (ULTRAM ) 50 MG tablet Take 1 tablet (50 mg total) by mouth every 12 (twelve) hours as needed for moderate pain (pain score 4-6). 60 tablet 5    triamcinolone ointment (KENALOG) 0.1 % Apply 1 application topically 2 (two) times daily.     doxazosin  (CARDURA ) 2 MG tablet TAKE 1 TABLET DAILY 90 tablet 1   ezetimibe  (ZETIA ) 10 MG tablet TAKE 1 TABLET DAILY 90 tablet 0   lansoprazole  (PREVACID ) 30 MG capsule TAKE 1 CAPSULE DAILY AT 12 NOON 90 capsule 1   ondansetron  (ZOFRAN  ODT) 4 MG disintegrating tablet Take 1 tablet (4 mg total) by mouth every 8 (eight) hours as needed for nausea or vomiting. 20 tablet 0   predniSONE  (DELTASONE ) 20 MG tablet Take 2 tablets (40 mg total) by mouth daily for 5 days. 10 tablet 0   rosuvastatin  (CRESTOR ) 5 MG tablet TAKE 1 TABLET DAILY 90 tablet 1   SYRINGE-NEEDLE, DISP, 3 ML (LUER LOCK SAFETY SYRINGES) 25G X 1 3 ML MISC Use to give b12 injection. 16 each 0   Syringe/Needle, Disp, (SYRINGE 3CC/18GX1-1/2) 18G X 1-1/2 3 ML MISC Use to draw up testosterone  10 each 0   No facility-administered medications prior to visit.    Review of Systems;  Patient denies headache, fevers, malaise, unintentional weight loss, skin rash, eye pain, sinus congestion and sinus pain, sore throat, dysphagia,  hemoptysis , cough, dyspnea, wheezing, chest pain, palpitations, orthopnea, edema, abdominal pain, nausea, melena, diarrhea, constipation, flank pain, dysuria, hematuria, urinary  Frequency, nocturia, numbness, tingling, seizures,  Focal weakness, Loss of consciousness,  Tremor, insomnia, depression, anxiety, and suicidal ideation.      Objective:  BP (!) 140/76   Pulse (!) 105   Ht 5' 9 (1.753 m)   Wt 246 lb (111.6 kg)   SpO2 97%   BMI 36.33 kg/m   BP Readings from Last 3 Encounters:  03/12/24 (!) 140/76  03/11/24 137/75  12/05/23 124/76    Wt Readings from Last 3 Encounters:  03/12/24 246 lb (111.6 kg)  12/05/23 259 lb 6.4 oz (117.7 kg)  10/17/23 240 lb (108.9 kg)    Physical Exam Vitals reviewed.  Constitutional:      General: He is not in acute distress.    Appearance: Normal appearance. He is  not ill-appearing, toxic-appearing or diaphoretic.  HENT:     Head: Normocephalic.     Right Ear: Decreased hearing noted. Tympanic membrane is injected and erythematous.     Left Ear: Decreased hearing noted. Tympanic membrane is injected and erythematous.  Eyes:     General: No scleral icterus.       Right eye: No discharge.        Left eye: No discharge.     Conjunctiva/sclera: Conjunctivae normal.  Cardiovascular:     Rate and Rhythm: Normal rate.  Pulmonary:     Effort: Pulmonary effort is normal.     Breath  sounds: Normal breath sounds.  Musculoskeletal:        General: Normal range of motion.     Cervical back: Normal range of motion.  Lymphadenopathy:     Cervical: Cervical adenopathy present.  Skin:    General: Skin is warm and dry.  Neurological:     General: No focal deficit present.     Mental Status: He is alert and oriented to person, place, and time. Mental status is at baseline.  Psychiatric:        Mood and Affect: Mood normal.        Behavior: Behavior normal.        Thought Content: Thought content normal.        Judgment: Judgment normal.     Lab Results  Component Value Date   HGBA1C 5.6 01/23/2024   HGBA1C 5.6 06/06/2023   HGBA1C 5.5 11/29/2022    Lab Results  Component Value Date   CREATININE 0.98 01/23/2024   CREATININE 0.85 10/18/2023   CREATININE 0.97 06/06/2023    Lab Results  Component Value Date   WBC 9.0 10/18/2023   HGB 15.2 10/18/2023   HCT 44.1 10/18/2023   PLT 269 10/18/2023   GLUCOSE 92 01/23/2024   CHOL 114 01/23/2024   TRIG 157 (H) 01/23/2024   HDL 31 (L) 01/23/2024   LDLDIRECT 61 01/23/2024   LDLCALC 56 01/23/2024   ALT 33 01/23/2024   AST 22 01/23/2024   NA 140 01/23/2024   K 4.0 01/23/2024   CL 104 01/23/2024   CREATININE 0.98 01/23/2024   BUN 8 01/23/2024   CO2 21 01/23/2024   TSH 1.660 01/23/2024   PSA 0.54 06/06/2023   HGBA1C 5.6 01/23/2024    No results found.  Assessment & Plan:  .Acute  non-recurrent frontal sinusitis Assessment & Plan: With otitis suggested by exam.  Changing azithromycin  to levaquin  and increasing the prednisone  dose as well.  Work note written    Other orders -     Ondansetron ; Take 1 tablet (4 mg total) by mouth every 8 (eight) hours as needed for nausea or vomiting.  Dispense: 20 tablet; Refill: 2 -     Doxazosin  Mesylate; Take 1 tablet (2 mg total) by mouth daily.  Dispense: 90 tablet; Refill: 1 -     Ezetimibe ; Take 1 tablet (10 mg total) by mouth daily.  Dispense: 90 tablet; Refill: 1 -     Lansoprazole ; TAKE 1 CAPSULE DAILY AT 12 NOON  Dispense: 90 capsule; Refill: 1 -     Rosuvastatin  Calcium ; Take 1 tablet (5 mg total) by mouth daily.  Dispense: 90 tablet; Refill: 1 -     predniSONE ; 6 tablets daily for 3 days, then reduce by 1 tablet daily until gone  Dispense: 33 tablet; Refill: 0 -     Hydrocod Poli-Chlorphe Poli ER; Take 5 mLs by mouth every 12 (twelve) hours as needed for cough.  Dispense: 140 mL; Refill: 0 -     levoFLOXacin ; Take 1 tablet (500 mg total) by mouth daily for 7 days.  Dispense: 7 tablet; Refill: 0     Follow-up: No follow-ups on file.   Verneita LITTIE Kettering, MD

## 2024-03-16 ENCOUNTER — Telehealth: Payer: Self-pay | Admitting: Internal Medicine

## 2024-03-16 NOTE — Telephone Encounter (Signed)
 Copied from CRM [id number]. 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

## 2024-03-16 NOTE — Telephone Encounter (Signed)
 Spoke to pt he stated that he spoke to [place] regarding note during visit but they forgot to give to him, I explained to him that Harlene was out today and would be back on Monday and she could help him with it

## 2024-03-19 ENCOUNTER — Telehealth: Payer: Self-pay

## 2024-03-19 NOTE — Telephone Encounter (Signed)
 Copied from CRM [id number]. 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 >> Mar 19, 2024  2:05 PM Rosina D wrote: patient called stating he want to speak to someone directly in the office regarding a letter being ready for him. Patient stated he need the note asap and he still have stopped up ears. Patient has to be to work tonight and he need the form today  CB [phone]

## 2024-03-19 NOTE — Telephone Encounter (Signed)
 The letter has been printed and placed in quick sign folder.

## 2024-03-19 NOTE — Telephone Encounter (Signed)
 Letter requested has been printed and placed in quick sign folder.

## 2024-03-26 ENCOUNTER — Other Ambulatory Visit: Payer: Self-pay | Admitting: Internal Medicine

## 2024-03-27 NOTE — Telephone Encounter (Signed)
 Refilled: 11/15/2023 Last OV: 03/12/2024 Next OV: not scheduled

## 2024-04-10 ENCOUNTER — Other Ambulatory Visit: Payer: Self-pay | Admitting: Internal Medicine

## 2024-04-15 ENCOUNTER — Other Ambulatory Visit: Payer: Self-pay | Admitting: Internal Medicine

## 2024-05-01 ENCOUNTER — Other Ambulatory Visit: Payer: Self-pay | Admitting: Internal Medicine

## 2024-05-02 NOTE — Telephone Encounter (Signed)
 Refilled: 01/25/2024 Last OV: 03/12/2024 Next OV: not scheduled Last Vitamin D  level on 01/23/2024 was abnormal.

## 2024-05-22 ENCOUNTER — Other Ambulatory Visit: Payer: Self-pay

## 2024-05-22 MED ORDER — LANSOPRAZOLE 30 MG PO CPDR: DELAYED_RELEASE_CAPSULE | ORAL | 1 refills | Status: AC

## 2024-05-28 ENCOUNTER — Other Ambulatory Visit: Payer: Self-pay

## 2024-05-28 MED ORDER — FLUTICASONE-SALMETEROL 250-50 MCG/ACT IN AEPB: 1.0000 | INHALATION_SPRAY | Freq: Two times a day (BID) | RESPIRATORY_TRACT | 3 refills | Status: DC

## 2024-05-28 MED ORDER — FLUTICASONE-SALMETEROL 250-50 MCG/ACT IN AEPB: 1.0000 | INHALATION_SPRAY | Freq: Two times a day (BID) | RESPIRATORY_TRACT | 3 refills | Status: AC

## 2024-07-07 ENCOUNTER — Other Ambulatory Visit: Payer: Self-pay | Admitting: Internal Medicine

## 2024-07-08 ENCOUNTER — Other Ambulatory Visit: Payer: Self-pay | Admitting: Internal Medicine

## 2024-07-20 ENCOUNTER — Other Ambulatory Visit: Payer: Self-pay | Admitting: Internal Medicine

## 2024-08-11 ENCOUNTER — Other Ambulatory Visit: Payer: Self-pay | Admitting: Internal Medicine

## 2024-08-24 ENCOUNTER — Other Ambulatory Visit: Payer: Self-pay | Admitting: Internal Medicine

## 2024-08-27 ENCOUNTER — Emergency Department (HOSPITAL_BASED_OUTPATIENT_CLINIC_OR_DEPARTMENT_OTHER): Payer: Self-pay | Admitting: Radiology

## 2024-08-27 ENCOUNTER — Encounter (HOSPITAL_BASED_OUTPATIENT_CLINIC_OR_DEPARTMENT_OTHER): Payer: Self-pay

## 2024-08-27 ENCOUNTER — Other Ambulatory Visit: Payer: Self-pay

## 2024-08-27 ENCOUNTER — Emergency Department (HOSPITAL_BASED_OUTPATIENT_CLINIC_OR_DEPARTMENT_OTHER)
Admission: EM | Admit: 2024-08-27 | Discharge: 2024-08-27 | Disposition: A | Discharge status: Home / Self Care | Payer: Self-pay | Source: Ambulatory Visit

## 2024-08-27 DIAGNOSIS — Y92410 Unspecified street and highway as the place of occurrence of the external cause: Secondary | ICD-10-CM | POA: Insufficient documentation

## 2024-08-27 DIAGNOSIS — M25511 Pain in right shoulder: Secondary | ICD-10-CM | POA: Diagnosis present

## 2024-08-27 DIAGNOSIS — R0781 Pleurodynia: Secondary | ICD-10-CM | POA: Diagnosis not present

## 2024-08-27 DIAGNOSIS — Y990 Civilian activity done for income or pay: Secondary | ICD-10-CM | POA: Insufficient documentation

## 2024-08-27 DIAGNOSIS — Z8616 Personal history of COVID-19: Secondary | ICD-10-CM | POA: Diagnosis not present

## 2024-08-27 DIAGNOSIS — Z79899 Other long term (current) drug therapy: Secondary | ICD-10-CM | POA: Insufficient documentation

## 2024-08-27 DIAGNOSIS — Z87891 Personal history of nicotine dependence: Secondary | ICD-10-CM | POA: Insufficient documentation

## 2024-08-27 DIAGNOSIS — I10 Essential (primary) hypertension: Secondary | ICD-10-CM | POA: Diagnosis not present

## 2024-08-27 MED ORDER — KETOROLAC TROMETHAMINE 15 MG/ML IJ SOLN
15.0000 mg | Freq: Once | INTRAMUSCULAR | Status: AC
  Administered 2024-08-27: 15 mg via INTRAMUSCULAR
  Filled 2024-08-27: qty 1

## 2024-08-27 NOTE — ED Notes (Signed)
 Patient transported to X-ray
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
## 2024-08-28 ENCOUNTER — Ambulatory Visit: Payer: Self-pay

## 2024-08-28 DIAGNOSIS — A09 Infectious gastroenteritis and colitis, unspecified: Secondary | ICD-10-CM

## 2024-08-28 NOTE — Telephone Encounter (Signed)
 Pt asking if able to have stool sample tested vs. Office visit, states has e.coli in the past and feels the same. Pt to call with any changes.  FYI Only or Action Required?: Action required by provider: clinical question for provider.  Patient was last seen in primary care on 03/12/2024 by Marylynn Verneita CROME, MD.  Called Nurse Triage reporting Diarrhea.  Symptoms began several days ago.  Interventions attempted: Prescription medications: zofran  and Rest, hydration, or home remedies.  Symptoms are: unchanged.  Triage Disposition: See Physician Within 24 Hours  Patient/caregiver understands and will follow disposition?: No, wishes to speak with PCP Reason for Disposition  [1] MODERATE diarrhea (e.g., 4-6 times / day more than normal) AND [2] present > 48 hours (2 days)  Answer Assessment - Initial Assessment Questions Pt states had e.coli in the past and has dx of diverticulitis. Pt reports abdominal pain and diarrhea x 4-5 days. Occurs after every meal, 4-5 times per day. Has not taken any anti-diarrheal medication. Is taking zofran  PRN for nausea.  1. DIARRHEA SEVERITY: How bad is the diarrhea? How many more stools have you had in the past 24 hours than normal?      4-5 times/day 2. ONSET: When did the diarrhea begin?      4-5 days 3. STOOL DESCRIPTION:  How loose or watery is the diarrhea? What is the stool color? Is there any blood or mucous in the stool?     Loose/watery, brown 4. VOMITING: Are you also vomiting? If Yes, ask: How many times in the past 24 hours?      Denies, has nausea 5. ABDOMEN PAIN: Are you having any abdomen pain? If Yes, ask: What does it feel like? (e.g., crampy, dull, intermittent, constant)      Cramping 6. ABDOMEN PAIN SEVERITY: If present, ask: How bad is the pain?  (e.g., Scale 1-10; mild, moderate, or severe)     Varies, worse at night 7. ORAL INTAKE: If vomiting, Have you been able to drink liquids? How much liquids have you had  in the past 24 hours?     Not vomiting 8. HYDRATION: Any signs of dehydration? (e.g., dry mouth [not just dry lips], too weak to stand, dizziness, new weight loss) When did you last urinate?     Denies 9. EXPOSURE: Have you traveled to a foreign country recently? Have you been exposed to anyone with diarrhea? Could you have eaten any food that was spoiled?     Unknown 10. ANTIBIOTIC USE: Are you taking antibiotics now or have you taken antibiotics in the past 2 months?       Denies 11. OTHER SYMPTOMS: Do you have any other symptoms? (e.g., fever, blood in stool)       Denies  Protocols used: [facility]  Message from [place] [facility] sent at 08/28/2024  1:21 PM EST  Summary: stomach issues   Reason for Triage: stomach issue... patient would like to know if he can stop by the office to pick up a container for a stool sample...  current symptoms re-occuring e. coli.. diarrhea, stomach cramping, denies bloody stool. soft/liquid diarrhea

## 2024-08-28 NOTE — Telephone Encounter (Signed)
 Answer Assessment - Initial Assessment Questions Pt states had e.coli in the past and has dx of diverticulitis. Pt reports abdominal pain and diarrhea x 4-5 days. Occurs after every meal, 4-5 times per day. Has not taken any anti-diarrheal medication. Is taking zofran  PRN for nausea.   Pt is wanting to know if he can come by and pick up a stool kit to see if he has e. Coli again.

## 2024-08-28 NOTE — Addendum Note (Signed)
 Addended by: MARYLYNN VERNEITA CROME on: 08/28/2024 03:21 PM   Modules accepted: Orders

## 2024-08-28 NOTE — Telephone Encounter (Signed)
 Spoke with pt to let him know that Dr. Marylynn would like for him to collect some stool studies. Pt is aware that he will pick up the kit from here and then drop it off at the [facility]. Kit has been placed up front for pick up.

## 2024-08-30 ENCOUNTER — Ambulatory Visit

## 2024-09-04 ENCOUNTER — Ambulatory Visit: Admitting: Orthopedic Surgery

## 2024-09-08 ENCOUNTER — Other Ambulatory Visit: Payer: Self-pay | Admitting: Internal Medicine

## 2024-09-13 ENCOUNTER — Other Ambulatory Visit: Payer: Self-pay | Admitting: Internal Medicine

## 2024-09-14 NOTE — Telephone Encounter (Signed)
 Vm left asking pt to CB to schedule a follow up

## 2024-09-18 NOTE — Telephone Encounter (Unsigned)
 Copied from CRM [id number]. Topic: Clinical - Medication Refill >> Sep 14, 2024  4:46 PM Viola F wrote: Medication: TraMADol  (ULTRAM ) 50 MG tablet [[id number]]  Has the patient contacted their pharmacy? Yes (Agent: If no, request that the patient contact the pharmacy for the refill. If patient does not wish to contact the pharmacy document the reason why and proceed with request.) (Agent: If yes, when and what did the pharmacy advise?)  This is the patient's preferred pharmacy:   [id number] [place], [place] - [address] [place] [address] Phone: [phone] Fax: [phone]  Is this the correct pharmacy for this prescription? Yes If no, delete pharmacy and type the correct one.   Has the prescription been filled recently? Yes  Is the patient out of the medication? No  Has the patient been seen for an appointment in the last year OR does the patient have an upcoming appointment? Yes  Can we respond through MyChart? Yes  Agent: Please be advised that Rx refills may take up to 3 business days. We ask that you follow-up with your pharmacy.

## 2024-10-22 ENCOUNTER — Ambulatory Visit: Admitting: Internal Medicine
